# Patient Record
Sex: Female | Born: 1950
Health system: Southern US, Community
[De-identification: ages and names within clinical notes are randomized; demographics above are authoritative.]

## PROBLEM LIST (undated history)

## (undated) DIAGNOSIS — K219 Gastro-esophageal reflux disease without esophagitis: Secondary | ICD-10-CM

## (undated) DIAGNOSIS — J449 Chronic obstructive pulmonary disease, unspecified: Secondary | ICD-10-CM

## (undated) DIAGNOSIS — Z9889 Other specified postprocedural states: Secondary | ICD-10-CM

## (undated) DIAGNOSIS — E785 Hyperlipidemia, unspecified: Secondary | ICD-10-CM

## (undated) DIAGNOSIS — Z720 Tobacco use: Secondary | ICD-10-CM

## (undated) DIAGNOSIS — I10 Essential (primary) hypertension: Secondary | ICD-10-CM

## (undated) DIAGNOSIS — I2510 Atherosclerotic heart disease of native coronary artery without angina pectoris: Secondary | ICD-10-CM

## (undated) DIAGNOSIS — E119 Type 2 diabetes mellitus without complications: Secondary | ICD-10-CM

## (undated) DIAGNOSIS — R112 Nausea with vomiting, unspecified: Secondary | ICD-10-CM

## (undated) DIAGNOSIS — I214 Non-ST elevation (NSTEMI) myocardial infarction: Secondary | ICD-10-CM

## (undated) DIAGNOSIS — G4730 Sleep apnea, unspecified: Secondary | ICD-10-CM

## (undated) DIAGNOSIS — Z9861 Coronary angioplasty status: Secondary | ICD-10-CM

## (undated) DIAGNOSIS — M1990 Unspecified osteoarthritis, unspecified site: Secondary | ICD-10-CM

## (undated) HISTORY — DX: Sleep apnea, unspecified: G47.30

## (undated) HISTORY — DX: Unspecified osteoarthritis, unspecified site: M19.90

## (undated) HISTORY — PX: EYE SURGERY: SHX253

## (undated) HISTORY — DX: Type 2 diabetes mellitus without complications: E11.9

## (undated) HISTORY — PX: TONSILLECTOMY: SUR1361

## (undated) HISTORY — PX: ABDOMINAL HYSTERECTOMY: SHX81

## (undated) HISTORY — DX: Gastro-esophageal reflux disease without esophagitis: K21.9

---

## 2013-09-10 ENCOUNTER — Inpatient Hospital Stay (HOSPITAL_COMMUNITY)
Admission: EM | Admit: 2013-09-10 | Discharge: 2013-09-13 | DRG: 419 | Disposition: A | Discharge status: Home / Self Care | Payer: Self-pay | Attending: General Surgery | Admitting: General Surgery

## 2013-09-10 ENCOUNTER — Emergency Department (HOSPITAL_COMMUNITY): Payer: Self-pay

## 2013-09-10 ENCOUNTER — Encounter (HOSPITAL_COMMUNITY): Payer: Self-pay | Admitting: Emergency Medicine

## 2013-09-10 DIAGNOSIS — K8050 Calculus of bile duct without cholangitis or cholecystitis without obstruction: Secondary | ICD-10-CM

## 2013-09-10 DIAGNOSIS — F17200 Nicotine dependence, unspecified, uncomplicated: Secondary | ICD-10-CM | POA: Diagnosis present

## 2013-09-10 DIAGNOSIS — K8000 Calculus of gallbladder with acute cholecystitis without obstruction: Secondary | ICD-10-CM

## 2013-09-10 DIAGNOSIS — I10 Essential (primary) hypertension: Secondary | ICD-10-CM | POA: Diagnosis present

## 2013-09-10 DIAGNOSIS — J4489 Other specified chronic obstructive pulmonary disease: Secondary | ICD-10-CM | POA: Diagnosis present

## 2013-09-10 DIAGNOSIS — J449 Chronic obstructive pulmonary disease, unspecified: Secondary | ICD-10-CM | POA: Diagnosis present

## 2013-09-10 HISTORY — DX: Nausea with vomiting, unspecified: R11.2

## 2013-09-10 HISTORY — DX: Calculus of gallbladder with acute cholecystitis without obstruction: K80.00

## 2013-09-10 HISTORY — DX: Nausea with vomiting, unspecified: Z98.890

## 2013-09-10 LAB — URINALYSIS, ROUTINE W REFLEX MICROSCOPIC
Bilirubin Urine: NEGATIVE
Glucose, UA: NEGATIVE mg/dL
Ketones, ur: NEGATIVE mg/dL
Leukocytes, UA: NEGATIVE
Protein, ur: NEGATIVE mg/dL
Urobilinogen, UA: 0.2 mg/dL (ref 0.0–1.0)
pH: 6 (ref 5.0–8.0)

## 2013-09-10 LAB — CBC WITH DIFFERENTIAL/PLATELET
Basophils Absolute: 0 10*3/uL (ref 0.0–0.1)
Eosinophils Absolute: 0.1 10*3/uL (ref 0.0–0.7)
Eosinophils Relative: 1 % (ref 0–5)
Hemoglobin: 13.3 g/dL (ref 12.0–15.0)
Lymphocytes Relative: 20 % (ref 12–46)
Lymphs Abs: 2.2 10*3/uL (ref 0.7–4.0)
MCH: 29.5 pg (ref 26.0–34.0)
Neutro Abs: 8.2 10*3/uL — ABNORMAL HIGH (ref 1.7–7.7)
Neutrophils Relative %: 74 % (ref 43–77)
Platelets: 343 10*3/uL (ref 150–400)
RBC: 4.51 MIL/uL (ref 3.87–5.11)
RDW: 14.3 % (ref 11.5–15.5)
WBC: 11.1 10*3/uL — ABNORMAL HIGH (ref 4.0–10.5)

## 2013-09-10 LAB — COMPREHENSIVE METABOLIC PANEL
Albumin: 3.4 g/dL — ABNORMAL LOW (ref 3.5–5.2)
BUN: 19 mg/dL (ref 6–23)
CO2: 26 mEq/L (ref 19–32)
Calcium: 9.4 mg/dL (ref 8.4–10.5)
Chloride: 97 mEq/L (ref 96–112)
Creatinine, Ser: 0.89 mg/dL (ref 0.50–1.10)
GFR calc Af Amer: 78 mL/min — ABNORMAL LOW (ref >90)
GFR calc non Af Amer: 67 mL/min — ABNORMAL LOW (ref >90)
Total Bilirubin: 0.2 mg/dL — ABNORMAL LOW (ref 0.3–1.2)
Total Protein: 7 g/dL (ref 6.0–8.3)

## 2013-09-10 LAB — LIPASE, BLOOD: Lipase: 23 U/L (ref 11–59)

## 2013-09-10 MED ORDER — DIPHENHYDRAMINE HCL 50 MG/ML IJ SOLN: 12.5000 mg | Freq: Four times a day (QID) | INTRAMUSCULAR | Status: DC | PRN

## 2013-09-10 MED ORDER — OXYCODONE HCL 5 MG PO TABS: 5.0000 mg | ORAL_TABLET | ORAL | Status: DC | PRN

## 2013-09-10 MED ORDER — HYDROMORPHONE HCL PF 1 MG/ML IJ SOLN
1.0000 mg | Freq: Once | INTRAMUSCULAR | Status: AC | PRN
  Administered 2013-09-10: 1 mg via INTRAVENOUS
  Filled 2013-09-10: qty 1

## 2013-09-10 MED ORDER — METOCLOPRAMIDE HCL 5 MG/ML IJ SOLN
10.0000 mg | Freq: Once | INTRAMUSCULAR | Status: AC
  Administered 2013-09-10: 10 mg via INTRAVENOUS
  Filled 2013-09-10: qty 2

## 2013-09-10 MED ORDER — KETOROLAC TROMETHAMINE 30 MG/ML IJ SOLN
30.0000 mg | Freq: Once | INTRAMUSCULAR | Status: AC
  Administered 2013-09-10: 30 mg via INTRAVENOUS
  Filled 2013-09-10: qty 1

## 2013-09-10 MED ORDER — ONDANSETRON HCL 4 MG/2ML IJ SOLN
4.0000 mg | Freq: Once | INTRAMUSCULAR | Status: AC
  Administered 2013-09-10: 4 mg via INTRAVENOUS
  Filled 2013-09-10: qty 2

## 2013-09-10 MED ORDER — SODIUM CHLORIDE 0.9 % IV SOLN
3.0000 g | Freq: Four times a day (QID) | INTRAVENOUS | Status: DC
  Administered 2013-09-10 – 2013-09-12 (×8): 3 g via INTRAVENOUS
  Filled 2013-09-10 (×13): qty 3

## 2013-09-10 MED ORDER — HYDROCODONE-ACETAMINOPHEN 5-325 MG PO TABS: 1.0000 | ORAL_TABLET | ORAL | Status: DC | PRN

## 2013-09-10 MED ORDER — KCL IN DEXTROSE-NACL 20-5-0.45 MEQ/L-%-% IV SOLN
INTRAVENOUS | Status: DC
  Administered 2013-09-10 – 2013-09-12 (×3): via INTRAVENOUS

## 2013-09-10 MED ORDER — ENOXAPARIN SODIUM 40 MG/0.4ML ~~LOC~~ SOLN
40.0000 mg | SUBCUTANEOUS | Status: DC
  Administered 2013-09-10 – 2013-09-11 (×2): 40 mg via SUBCUTANEOUS
  Filled 2013-09-10 (×2): qty 0.4

## 2013-09-10 MED ORDER — SODIUM CHLORIDE 0.9 % IV SOLN
INTRAVENOUS | Status: DC
  Administered 2013-09-10: 05:00:00 via INTRAVENOUS

## 2013-09-10 MED ORDER — HYDROMORPHONE HCL PF 1 MG/ML IJ SOLN
1.0000 mg | INTRAMUSCULAR | Status: DC | PRN
  Administered 2013-09-10 – 2013-09-12 (×11): 1 mg via INTRAVENOUS
  Filled 2013-09-10 (×11): qty 1

## 2013-09-10 MED ORDER — INFLUENZA VAC SPLIT QUAD 0.5 ML IM SUSP
0.5000 mL | INTRAMUSCULAR | Status: AC
  Administered 2013-09-11: 0.5 mL via INTRAMUSCULAR
  Filled 2013-09-10: qty 0.5

## 2013-09-10 MED ORDER — ONDANSETRON 8 MG PO TBDP: 8.0000 mg | ORAL_TABLET | Freq: Three times a day (TID) | ORAL | Status: DC | PRN

## 2013-09-10 MED ORDER — ACETAMINOPHEN 650 MG RE SUPP: 650.0000 mg | Freq: Four times a day (QID) | RECTAL | Status: DC | PRN

## 2013-09-10 MED ORDER — NICOTINE 21 MG/24HR TD PT24
21.0000 mg | MEDICATED_PATCH | Freq: Every day | TRANSDERMAL | Status: DC
  Administered 2013-09-10 – 2013-09-12 (×3): 21 mg via TRANSDERMAL
  Filled 2013-09-10 (×4): qty 1

## 2013-09-10 MED ORDER — ONDANSETRON HCL 4 MG/2ML IJ SOLN
4.0000 mg | Freq: Four times a day (QID) | INTRAMUSCULAR | Status: DC | PRN
  Administered 2013-09-10 – 2013-09-12 (×7): 4 mg via INTRAVENOUS
  Filled 2013-09-10 (×6): qty 2

## 2013-09-10 MED ORDER — PNEUMOCOCCAL VAC POLYVALENT 25 MCG/0.5ML IJ INJ
0.5000 mL | INJECTION | INTRAMUSCULAR | Status: AC
  Administered 2013-09-11: 0.5 mL via INTRAMUSCULAR
  Filled 2013-09-10: qty 0.5

## 2013-09-10 MED ORDER — DIPHENHYDRAMINE HCL 12.5 MG/5ML PO ELIX: 12.5000 mg | ORAL_SOLUTION | Freq: Four times a day (QID) | ORAL | Status: DC | PRN

## 2013-09-10 MED ORDER — ACETAMINOPHEN 325 MG PO TABS
650.0000 mg | ORAL_TABLET | Freq: Four times a day (QID) | ORAL | Status: DC | PRN
  Administered 2013-09-11: 650 mg via ORAL
  Filled 2013-09-10: qty 2

## 2013-09-10 MED ORDER — HYDROMORPHONE HCL PF 1 MG/ML IJ SOLN
1.0000 mg | Freq: Once | INTRAMUSCULAR | Status: AC
  Administered 2013-09-10: 1 mg via INTRAVENOUS
  Filled 2013-09-10: qty 1

## 2013-09-10 NOTE — ED Notes (Signed)
Reports right side flank pain that started around midnight. Pt states pain started & then vomiting. Denies any urinary problems & no history of stones.

## 2013-09-10 NOTE — ED Provider Notes (Signed)
CSN: [id number]     Arrival date & time 09/10/13  0405 History   First MD Initiated Contact with Patient 09/10/13 0430     Chief Complaint  Patient presents with  . Flank Pain   (Consider location/radiation/quality/duration/timing/severity/associated sxs/prior Treatment) HPI This is a 62 year old female with right flank pain that began around midnight. The pain radiates to the right lower rib cage. The pain is constant and does not change with movement or palpation. It has been associated with nausea and vomiting. She has a history of nephrolithiasis. She denied any urinary complaints but had difficulty voiding in the ED. The pain is severe at its worst.   History reviewed. No pertinent past medical history. Past Surgical History  Procedure Laterality Date  . Abdominal hysterectomy    . Tonsillectomy     No family history on file. History  Substance Use Topics  . Smoking status: Current Every Day Smoker    Types: Cigarettes  . Smokeless tobacco: Not on file  . Alcohol Use: No   OB History   Grav Para Term Preterm Abortions TAB SAB Ect Mult Living                 Review of Systems  All other systems reviewed and are negative.    Allergies  Review of patient's allergies indicates no known allergies.  Home Medications   Current Outpatient Rx  Name  Route  Sig  Dispense  Refill  . ibuprofen (ADVIL,MOTRIN) 800 MG tablet   Oral   Take 800 mg by mouth as needed.          BP 156/65  Pulse 77  Temp(Src) 97.8 F (36.6 C) (Oral)  Resp 20  Ht 5\' 4"  (1.626 m)  Wt 200 lb (90.719 kg)  BMI 34.31 kg/m2  SpO2 100%  Physical Exam General: Well-developed, well-nourished female in no acute distress; appearance consistent with age of record; appears uncomfortable HENT: normocephalic; atraumatic Eyes: pupils equal, round and reactive to light; extraocular muscles intact Neck: supple Heart: regular rate and rhythm Lungs: clear to auscultation bilaterally Abdomen: soft;  nondistended; nontender; no masses or hepatosplenomegaly; bowel sounds present Extremities: No deformity; full range of motion; pulses normal;  Neurologic: Awake, alert and oriented; motor function intact in all extremities and symmetric; no facial droop Skin: Warm and dry Psychiatric: Mildly agitated    ED Course  Procedures (including critical care time)  MDM  Nursing notes and vitals signs, including pulse oximetry, reviewed.  Summary of this visit's results, reviewed by myself:  Labs:  Results for orders placed during the hospital encounter of 09/10/13 (from the past 24 hour(s))  URINALYSIS, ROUTINE W REFLEX MICROSCOPIC     Status: Abnormal   Collection Time    09/10/13  4:35 AM      Result Value Range   Color, Urine YELLOW  YELLOW   APPearance CLEAR  CLEAR   Specific Gravity, Urine >1.030 (*) 1.005 - 1.030   pH 6.0  5.0 - 8.0   Glucose, UA NEGATIVE  NEGATIVE mg/dL   Hgb urine dipstick NEGATIVE  NEGATIVE   Bilirubin Urine NEGATIVE  NEGATIVE   Ketones, ur NEGATIVE  NEGATIVE mg/dL   Protein, ur NEGATIVE  NEGATIVE mg/dL   Urobilinogen, UA 0.2  0.0 - 1.0 mg/dL   Nitrite NEGATIVE  NEGATIVE   Leukocytes, UA NEGATIVE  NEGATIVE  COMPREHENSIVE METABOLIC PANEL     Status: Abnormal   Collection Time    09/10/13  5:30 AM  Result Value Range   Sodium 136  135 - 145 mEq/L   Potassium 3.6  3.5 - 5.1 mEq/L   Chloride 97  96 - 112 mEq/L   CO2 26  19 - 32 mEq/L   Glucose, Bld 160 (*) 70 - 99 mg/dL   BUN 19  6 - 23 mg/dL   Creatinine, Ser 1.61  0.50 - 1.10 mg/dL   Calcium 9.4  8.4 - 09.6 mg/dL   Total Protein 7.0  6.0 - 8.3 g/dL   Albumin 3.4 (*) 3.5 - 5.2 g/dL   AST 13  0 - 37 U/L   ALT 12  0 - 35 U/L   Alkaline Phosphatase 73  39 - 117 U/L   Total Bilirubin 0.2 (*) 0.3 - 1.2 mg/dL   GFR calc non Af Amer 67 (*) >90 mL/min   GFR calc Af Amer 78 (*) >90 mL/min  LIPASE, BLOOD     Status: None   Collection Time    09/10/13  5:30 AM      Result Value Range   Lipase 23  11 -  59 U/L  CBC WITH DIFFERENTIAL     Status: Abnormal   Collection Time    09/10/13  5:30 AM      Result Value Range   WBC 11.1 (*) 4.0 - 10.5 K/uL   RBC 4.51  3.87 - 5.11 MIL/uL   Hemoglobin 13.3  12.0 - 15.0 g/dL   HCT 04.5  40.9 - 81.1 %   MCV 89.1  78.0 - 100.0 fL   MCH 29.5  26.0 - 34.0 pg   MCHC 33.1  30.0 - 36.0 g/dL   RDW 91.4  78.2 - 95.6 %   Platelets 343  150 - 400 K/uL   Neutrophils Relative % 74  43 - 77 %   Neutro Abs 8.2 (*) 1.7 - 7.7 K/uL   Lymphocytes Relative 20  12 - 46 %   Lymphs Abs 2.2  0.7 - 4.0 K/uL   Monocytes Relative 6  3 - 12 %   Monocytes Absolute 0.6  0.1 - 1.0 K/uL   Eosinophils Relative 1  0 - 5 %   Eosinophils Absolute 0.1  0.0 - 0.7 K/uL   Basophils Relative 0  0 - 1 %   Basophils Absolute 0.0  0.0 - 0.1 K/uL    Imaging Studies: Ct Abdomen Pelvis Wo Contrast  09/10/2013   CLINICAL DATA:  Right flank pain starting around midnight. Vomiting.  EXAM: CT ABDOMEN AND PELVIS WITHOUT CONTRAST  TECHNIQUE: Multidetector CT imaging of the abdomen and pelvis was performed following the standard protocol without intravenous contrast.  COMPARISON:  None.  FINDINGS: Mild dependent atelectasis in the lung bases.  The kidneys appear symmetrical in size and shape. No pyelocaliectasis or ureterectasis. No renal, ureteral, or bladder stones. No bladder wall thickening.  Cholelithiasis with gas containing stones in the gallbladder. No infiltration in the fat around the gallbladder. No bile duct dilatation. The unenhanced appearance of the liver, spleen, pancreas, adrenal glands, inferior vena cava, abdominal aorta, and retroperitoneal lymph nodes is unremarkable. The stomach, small bowel, and colon are not abnormally distended. No free air or free fluid in the abdomen. Abdominal wall musculature appears intact. Moderately prominent visceral adipose tissues.  Pelvis: The bladder is decompressed. Uterus appears to be surgically absent. No abnormal adnexal masses. Scattered  diverticula in the sigmoid colon without evidence of diverticulitis. The appendix is normal. No free or loculated pelvic fluid collections.  Degenerative changes in the lumbar spine. No destructive bone lesions.  IMPRESSION: No renal or ureteral stone or obstruction. Cholelithiasis without additional inflammatory changes around the gallbladder.   Electronically Signed   By: Burman Nieves M.D.   On: 09/10/2013 05:38    EKG Interpretation    Date/Time:  Saturday September 10 2013 04:37:26 EST Ventricular Rate:  75 PR Interval:  170 QRS Duration: 106 QT Interval:  412 QTC Calculation: 460 R Axis:   7 Text Interpretation:  Normal sinus rhythm with sinus arrhythmia Possible Left atrial enlargement Incomplete right bundle branch block Cannot rule out Anterior infarct , age undetermined Abnormal ECG No previous ECGs available Confirmed by Ibtisam Benge  MD, Alonzo Loving ([id number]) on 09/10/2013 4:45:00 AM     5:58 AM Pain, nausea controlled at this time. Will obtain US to further evaluate the gallbladder.  6:50 AM Awaiting bilary [place].     Hanley Seamen, MD 09/10/13 [phone]

## 2013-09-10 NOTE — ED Provider Notes (Signed)
Care assumed from Dr. Read Drivers at shift change. Patient had CT scan performed which revealed gallstones. She has a mild white count and was awaiting an ultrasound of her right upper quadrant. This was to be done at 9:00. Dr. Lovell Sheehan from surgery pass through the emergency department on his way to see a patient and inquired about this patient. He reviewed the chart and did not feel as though ultrasound was indicated. She was not feeling much better and he decided he would admit her to the hospital and discuss having her gallbladder removed on Monday. She will be admitted to his service.  Geoffery Lyons, MD 09/10/13 1005

## 2013-09-10 NOTE — ED Notes (Signed)
MD at bedside. 

## 2013-09-10 NOTE — Progress Notes (Signed)
Pt came up to floor from ED. Pt in NAD will continue to monitor.

## 2013-09-10 NOTE — H&P (Signed)
Monica Thompson is an 62 y.o. female.   Chief Complaint: Right-sided abdominal pain HPI: Patient is a 62 year old white female who yesterday evening began experiencing right-sided abdominal pain which radiated to the right flank. She also had nausea. She presented emergency room for further evaluation treatment. She denied any fever, chills, or jaundice. She has never had this pain before. CT scan of the abdomen revealed cholelithiasis. No nephrolithiasis. There was noted within the gallstones, but the significance of this is unknown as the was no air in the wall of the gallbladder. Due to ongoing pain and nausea, the patient be admitted to the hospital for further evaluation treatment.  History reviewed. No pertinent past medical history.  Past Surgical History  Procedure Laterality Date  . Abdominal hysterectomy    . Tonsillectomy      No family history on file. Social History:  reports that she has been smoking Cigarettes.  She has been smoking about 0.00 packs per day. She does not have any smokeless tobacco history on file. She reports that she does not drink alcohol or use illicit drugs.  Allergies: No Known Allergies   (Not in a hospital admission)  Results for orders placed during the hospital encounter of 09/10/13 (from the past 48 hour(s))  URINALYSIS, ROUTINE W REFLEX MICROSCOPIC     Status: Abnormal   Collection Time    09/10/13  4:35 AM      Result Value Range   Color, Urine YELLOW  YELLOW   APPearance CLEAR  CLEAR   Specific Gravity, Urine >1.030 (*) 1.005 - 1.030   pH 6.0  5.0 - 8.0   Glucose, UA NEGATIVE  NEGATIVE mg/dL   Hgb urine dipstick NEGATIVE  NEGATIVE   Bilirubin Urine NEGATIVE  NEGATIVE   Ketones, ur NEGATIVE  NEGATIVE mg/dL   Protein, ur NEGATIVE  NEGATIVE mg/dL   Urobilinogen, UA 0.2  0.0 - 1.0 mg/dL   Nitrite NEGATIVE  NEGATIVE   Leukocytes, UA NEGATIVE  NEGATIVE   Comment: MICROSCOPIC NOT DONE ON URINES WITH NEGATIVE PROTEIN, BLOOD, LEUKOCYTES,  NITRITE, OR GLUCOSE <1000 mg/dL.  COMPREHENSIVE METABOLIC PANEL     Status: Abnormal   Collection Time    09/10/13  5:30 AM      Result Value Range   Sodium 136  135 - 145 mEq/L   Potassium 3.6  3.5 - 5.1 mEq/L   Chloride 97  96 - 112 mEq/L   CO2 26  19 - 32 mEq/L   Glucose, Bld 160 (*) 70 - 99 mg/dL   BUN 19  6 - 23 mg/dL   Creatinine, Ser 8.29  0.50 - 1.10 mg/dL   Calcium 9.4  8.4 - 56.2 mg/dL   Total Protein 7.0  6.0 - 8.3 g/dL   Albumin 3.4 (*) 3.5 - 5.2 g/dL   AST 13  0 - 37 U/L   ALT 12  0 - 35 U/L   Alkaline Phosphatase 73  39 - 117 U/L   Total Bilirubin 0.2 (*) 0.3 - 1.2 mg/dL   GFR calc non Af Amer 67 (*) >90 mL/min   GFR calc Af Amer 78 (*) >90 mL/min   Comment: (NOTE)     The eGFR has been calculated using the CKD EPI equation.     This calculation has not been validated in all clinical situations.     eGFR's persistently <90 mL/min signify possible Chronic Kidney     Disease.  LIPASE, BLOOD     Status: None  Collection Time    09/10/13  5:30 AM      Result Value Range   Lipase 23  11 - 59 U/L  CBC WITH DIFFERENTIAL     Status: Abnormal   Collection Time    09/10/13  5:30 AM      Result Value Range   WBC 11.1 (*) 4.0 - 10.5 K/uL   RBC 4.51  3.87 - 5.11 MIL/uL   Hemoglobin 13.3  12.0 - 15.0 g/dL   HCT 13.2  44.0 - 10.2 %   MCV 89.1  78.0 - 100.0 fL   MCH 29.5  26.0 - 34.0 pg   MCHC 33.1  30.0 - 36.0 g/dL   RDW 72.5  36.6 - 44.0 %   Platelets 343  150 - 400 K/uL   Neutrophils Relative % 74  43 - 77 %   Neutro Abs 8.2 (*) 1.7 - 7.7 K/uL   Lymphocytes Relative 20  12 - 46 %   Lymphs Abs 2.2  0.7 - 4.0 K/uL   Monocytes Relative 6  3 - 12 %   Monocytes Absolute 0.6  0.1 - 1.0 K/uL   Eosinophils Relative 1  0 - 5 %   Eosinophils Absolute 0.1  0.0 - 0.7 K/uL   Basophils Relative 0  0 - 1 %   Basophils Absolute 0.0  0.0 - 0.1 K/uL   Ct Abdomen Pelvis Wo Contrast  09/10/2013   CLINICAL DATA:  Right flank pain starting around midnight. Vomiting.  EXAM: CT  ABDOMEN AND PELVIS WITHOUT CONTRAST  TECHNIQUE: Multidetector CT imaging of the abdomen and pelvis was performed following the standard protocol without intravenous contrast.  COMPARISON:  None.  FINDINGS: Mild dependent atelectasis in the lung bases.  The kidneys appear symmetrical in size and shape. No pyelocaliectasis or ureterectasis. No renal, ureteral, or bladder stones. No bladder wall thickening.  Cholelithiasis with gas containing stones in the gallbladder. No infiltration in the fat around the gallbladder. No bile duct dilatation. The unenhanced appearance of the liver, spleen, pancreas, adrenal glands, inferior vena cava, abdominal aorta, and retroperitoneal lymph nodes is unremarkable. The stomach, small bowel, and colon are not abnormally distended. No free air or free fluid in the abdomen. Abdominal wall musculature appears intact. Moderately prominent visceral adipose tissues.  Pelvis: The bladder is decompressed. Uterus appears to be surgically absent. No abnormal adnexal masses. Scattered diverticula in the sigmoid colon without evidence of diverticulitis. The appendix is normal. No free or loculated pelvic fluid collections. Degenerative changes in the lumbar spine. No destructive bone lesions.  IMPRESSION: No renal or ureteral stone or obstruction. Cholelithiasis without additional inflammatory changes around the gallbladder.   Electronically Signed   By: Burman Nieves M.D.   On: 09/10/2013 05:38    Review of Systems  Constitutional: Positive for malaise/fatigue.  HENT: Negative.   Eyes: Negative.   Respiratory: Negative.   Cardiovascular: Negative.   Gastrointestinal: Positive for nausea and abdominal pain.  Genitourinary: Negative.   Musculoskeletal: Negative.   Skin: Negative.   All other systems reviewed and are negative.    Blood pressure 121/73, pulse 75, temperature 97.8 F (36.6 C), temperature source Oral, resp. rate 18, height 5\' 4"  (1.626 m), weight 90.719 kg (200  lb), SpO2 96.00%. Physical Exam  Vitals reviewed. Constitutional: She is oriented to person, place, and time. She appears well-developed and well-nourished.  Obese WF  HENT:  Head: Normocephalic and atraumatic.  Eyes: No scleral icterus.  Neck: Normal range of motion.  Neck supple.  Cardiovascular: Normal rate, regular rhythm and normal heart sounds.   Respiratory: Effort normal and breath sounds normal.  GI: Soft. She exhibits no distension. There is tenderness. There is no rebound.  Tender in right upper quadrant to deep palpation.  No rigidity noted.  Neurological: She is alert and oriented to person, place, and time.  Skin: Skin is warm and dry.     Assessment/Plan Impression: Cholecystitis, cholelithiasis Plan: The patient be admitted to the hospital for IV antibiotics, pain control, nausea control. Will continue to monitor white blood cell count and liver test. She subsequently will undergo a laparoscopic cholecystectomy. The risks and benefits of the procedure including bleeding, infection, hepatobiliary injury, and the possibility of an open procedure were fully explained to the patient, who gave informed consent.  Cleofas Hudgins A 09/10/2013, 8:18 AM

## 2013-09-11 ENCOUNTER — Inpatient Hospital Stay (HOSPITAL_COMMUNITY): Payer: Self-pay

## 2013-09-11 LAB — CBC
HCT: 43.9 % (ref 36.0–46.0)
Hemoglobin: 13.9 g/dL (ref 12.0–15.0)
MCH: 29 pg (ref 26.0–34.0)
MCHC: 31.7 g/dL (ref 30.0–36.0)
MCV: 91.6 fL (ref 78.0–100.0)
RDW: 14.4 % (ref 11.5–15.5)

## 2013-09-11 LAB — BASIC METABOLIC PANEL
BUN: 13 mg/dL (ref 6–23)
CO2: 25 mEq/L (ref 19–32)
Calcium: 8.6 mg/dL (ref 8.4–10.5)
Creatinine, Ser: 0.74 mg/dL (ref 0.50–1.10)
GFR calc non Af Amer: 88 mL/min — ABNORMAL LOW (ref >90)
Glucose, Bld: 115 mg/dL — ABNORMAL HIGH (ref 70–99)
Sodium: 133 mEq/L — ABNORMAL LOW (ref 135–145)

## 2013-09-11 LAB — HEPATIC FUNCTION PANEL
ALT: 31 U/L (ref 0–35)
Alkaline Phosphatase: 78 U/L (ref 39–117)
Bilirubin, Direct: <0.1 mg/dL (ref 0.0–0.3)
Total Bilirubin: 0.3 mg/dL (ref 0.3–1.2)
Total Protein: 6.8 g/dL (ref 6.0–8.3)

## 2013-09-11 LAB — MAGNESIUM: Magnesium: 2.2 mg/dL (ref 1.5–2.5)

## 2013-09-11 LAB — PHOSPHORUS: Phosphorus: 3.2 mg/dL (ref 2.3–4.6)

## 2013-09-11 MED ORDER — CHLORHEXIDINE GLUCONATE 4 % EX LIQD
1.0000 "application " | Freq: Once | CUTANEOUS | Status: AC
  Administered 2013-09-12: 1 via TOPICAL
  Filled 2013-09-11: qty 15

## 2013-09-11 NOTE — Progress Notes (Signed)
Subjective: Still with right upper quadrant abdominal pain, though is somewhat improved.  Objective: Vital signs in last 24 hours: Temp:  [97.9 F (36.6 C)-100.4 F (38 C)] 98.6 F (37 C) (11/30 0942) Pulse Rate:  [64-93] 93 (11/30 0500) Resp:  [18] 18 (11/30 0500) BP: (92-185)/(60-80) 185/80 mmHg (11/30 0500) SpO2:  [92 %-95 %] 95 % (11/30 0942) Last BM Date: 09/09/13  Intake/Output from previous day: 11/29 0701 - 11/30 0700 In: 460 [P.O.:360; IV Piggyback:100] Out: 200 [Urine:200] Intake/Output this shift: Total I/O In: 240 [P.O.:240] Out: 200 [Urine:200]  General appearance: alert, cooperative and no distress Resp: clear to auscultation bilaterally Cardio: regular rate and rhythm, S1, S2 normal, no murmur, click, rub or gallop GI: Soft, tender in right upper quadrant to deep palpation. No rigidity noted.  Lab Results:   Recent Labs  09/10/13 0530 09/11/13 0515  WBC 11.1* 9.0  HGB 13.3 13.9  HCT 40.2 43.9  PLT 343 280   BMET  Recent Labs  09/10/13 0530 09/11/13 0515  NA 136 133*  K 3.6 3.9  CL 97 97  CO2 26 25  GLUCOSE 160* 115*  BUN 19 13  CREATININE 0.89 0.74  CALCIUM 9.4 8.6   PT/INR No results found for this basename: LABPROT, INR,  in the last 72 hours  Studies/Results: Ct Abdomen Pelvis Wo Contrast  09/10/2013   CLINICAL DATA:  Right flank pain starting around midnight. Vomiting.  EXAM: CT ABDOMEN AND PELVIS WITHOUT CONTRAST  TECHNIQUE: Multidetector CT imaging of the abdomen and pelvis was performed following the standard protocol without intravenous contrast.  COMPARISON:  None.  FINDINGS: Mild dependent atelectasis in the lung bases.  The kidneys appear symmetrical in size and shape. No pyelocaliectasis or ureterectasis. No renal, ureteral, or bladder stones. No bladder wall thickening.  Cholelithiasis with gas containing stones in the gallbladder. No infiltration in the fat around the gallbladder. No bile duct dilatation. The unenhanced  appearance of the liver, spleen, pancreas, adrenal glands, inferior vena cava, abdominal aorta, and retroperitoneal lymph nodes is unremarkable. The stomach, small bowel, and colon are not abnormally distended. No free air or free fluid in the abdomen. Abdominal wall musculature appears intact. Moderately prominent visceral adipose tissues.  Pelvis: The bladder is decompressed. Uterus appears to be surgically absent. No abnormal adnexal masses. Scattered diverticula in the sigmoid colon without evidence of diverticulitis. The appendix is normal. No free or loculated pelvic fluid collections. Degenerative changes in the lumbar spine. No destructive bone lesions.  IMPRESSION: No renal or ureteral stone or obstruction. Cholelithiasis without additional inflammatory changes around the gallbladder.   Electronically Signed   By: Burman Nieves M.D.   On: 09/10/2013 05:38   Dg Chest 2 View  09/11/2013   CLINICAL DATA:  Cough and congestion  EXAM: CHEST - 2 VIEW  COMPARISON:  None  FINDINGS: Mild cardiomegaly. Lungs are clear. No effusion. Spurring in the mid and lower thoracic spine.  IMPRESSION: Mild cardiomegaly   Electronically Signed   By: Oley Balm M.D.   On: 09/11/2013 08:55    Anti-infectives: Anti-infectives   Start     Dose/Rate Route Frequency Ordered Stop   09/10/13 1200  Ampicillin-Sulbactam (UNASYN) 3 g in sodium chloride 0.9 % 100 mL IVPB     3 g 100 mL/hr over 60 Minutes Intravenous Every 6 hours 09/10/13 1054        Assessment/Plan: Impression: Cholecystitis, cholelithiasis, hypertension Plan: Will proceed with laparoscopic cholecystectomy tomorrow. The risks and benefits of the procedure  including bleeding, infection, hepatobiliary injury, and the possibility of an open procedure were fully explained to the patient, who gave informed consent. Will start clonidine due to hypertension.  LOS: 1 day    Ruie Sendejo A 09/11/2013

## 2013-09-12 ENCOUNTER — Inpatient Hospital Stay (HOSPITAL_COMMUNITY): Payer: Self-pay | Admitting: Anesthesiology

## 2013-09-12 ENCOUNTER — Encounter (HOSPITAL_COMMUNITY): Payer: Self-pay | Admitting: Anesthesiology

## 2013-09-12 ENCOUNTER — Encounter (HOSPITAL_COMMUNITY): Payer: Self-pay | Admitting: *Deleted

## 2013-09-12 ENCOUNTER — Encounter (HOSPITAL_COMMUNITY)
Admission: EM | Disposition: A | Discharge status: Home / Self Care | Payer: Self-pay | Source: Home / Self Care | Attending: General Surgery

## 2013-09-12 HISTORY — PX: CHOLECYSTECTOMY: SHX55

## 2013-09-12 LAB — SURGICAL PCR SCREEN
MRSA, PCR: NEGATIVE
Staphylococcus aureus: NEGATIVE

## 2013-09-12 SURGERY — LAPAROSCOPIC CHOLECYSTECTOMY
Anesthesia: General | Site: Abdomen | Wound class: Contaminated

## 2013-09-12 MED ORDER — ONDANSETRON HCL 4 MG/2ML IJ SOLN: 4.0000 mg | Freq: Once | INTRAMUSCULAR | Status: DC | PRN

## 2013-09-12 MED ORDER — GLYCOPYRROLATE 0.2 MG/ML IJ SOLN
INTRAMUSCULAR | Status: AC
  Filled 2013-09-12: qty 1

## 2013-09-12 MED ORDER — DEXAMETHASONE SODIUM PHOSPHATE 4 MG/ML IJ SOLN
INTRAMUSCULAR | Status: AC
  Filled 2013-09-12: qty 1

## 2013-09-12 MED ORDER — FENTANYL CITRATE 0.05 MG/ML IJ SOLN
25.0000 ug | INTRAMUSCULAR | Status: DC | PRN
  Administered 2013-09-12 (×2): 50 ug via INTRAVENOUS

## 2013-09-12 MED ORDER — GLYCOPYRROLATE 0.2 MG/ML IJ SOLN
INTRAMUSCULAR | Status: AC
  Filled 2013-09-12: qty 2

## 2013-09-12 MED ORDER — ENOXAPARIN SODIUM 40 MG/0.4ML ~~LOC~~ SOLN
40.0000 mg | SUBCUTANEOUS | Status: DC
  Administered 2013-09-12: 40 mg via SUBCUTANEOUS
  Filled 2013-09-12: qty 0.4

## 2013-09-12 MED ORDER — DEXAMETHASONE SODIUM PHOSPHATE 4 MG/ML IJ SOLN
4.0000 mg | Freq: Once | INTRAMUSCULAR | Status: AC
  Administered 2013-09-12: 4 mg via INTRAVENOUS

## 2013-09-12 MED ORDER — SODIUM CHLORIDE 0.9 % IR SOLN
Status: DC | PRN
  Administered 2013-09-12: 500 mL

## 2013-09-12 MED ORDER — LIDOCAINE HCL 1 % IJ SOLN
INTRAMUSCULAR | Status: DC | PRN
  Administered 2013-09-12: 50 mg via INTRADERMAL

## 2013-09-12 MED ORDER — MIDAZOLAM HCL 2 MG/2ML IJ SOLN
1.0000 mg | INTRAMUSCULAR | Status: DC | PRN
  Administered 2013-09-12: 2 mg via INTRAVENOUS

## 2013-09-12 MED ORDER — NALOXONE HCL 0.4 MG/ML IJ SOLN
INTRAMUSCULAR | Status: DC | PRN
  Administered 2013-09-12: 50 ug via INTRAVENOUS

## 2013-09-12 MED ORDER — SODIUM CHLORIDE 0.9 % IN NEBU
INHALATION_SOLUTION | RESPIRATORY_TRACT | Status: AC
  Filled 2013-09-12: qty 3

## 2013-09-12 MED ORDER — PANTOPRAZOLE SODIUM 40 MG PO TBEC
40.0000 mg | DELAYED_RELEASE_TABLET | Freq: Every day | ORAL | Status: DC
  Administered 2013-09-12: 40 mg via ORAL
  Filled 2013-09-12: qty 1

## 2013-09-12 MED ORDER — ALBUTEROL SULFATE (5 MG/ML) 0.5% IN NEBU
2.5000 mg | INHALATION_SOLUTION | Freq: Once | RESPIRATORY_TRACT | Status: AC
  Administered 2013-09-12: 2.5 mg via RESPIRATORY_TRACT

## 2013-09-12 MED ORDER — MIDAZOLAM HCL 2 MG/2ML IJ SOLN
INTRAMUSCULAR | Status: AC
  Filled 2013-09-12: qty 2

## 2013-09-12 MED ORDER — SODIUM CHLORIDE 0.9 % IV SOLN
3.0000 g | Freq: Four times a day (QID) | INTRAVENOUS | Status: DC
  Administered 2013-09-12 – 2013-09-13 (×3): 3 g via INTRAVENOUS
  Filled 2013-09-12 (×9): qty 3

## 2013-09-12 MED ORDER — OXYCODONE-ACETAMINOPHEN 5-325 MG PO TABS
1.0000 | ORAL_TABLET | ORAL | Status: DC | PRN
  Administered 2013-09-12: 2 via ORAL
  Administered 2013-09-12: 1 via ORAL
  Administered 2013-09-13 (×2): 2 via ORAL
  Filled 2013-09-12 (×2): qty 2
  Filled 2013-09-12: qty 1
  Filled 2013-09-12: qty 2
  Filled 2013-09-12: qty 1
  Filled 2013-09-12: qty 2

## 2013-09-12 MED ORDER — BUPIVACAINE HCL (PF) 0.5 % IJ SOLN
INTRAMUSCULAR | Status: DC | PRN
  Administered 2013-09-12: 10 mL

## 2013-09-12 MED ORDER — KETOROLAC TROMETHAMINE 30 MG/ML IJ SOLN
30.0000 mg | Freq: Once | INTRAMUSCULAR | Status: AC
  Administered 2013-09-12: 30 mg via INTRAVENOUS
  Filled 2013-09-12: qty 1

## 2013-09-12 MED ORDER — GLYCOPYRROLATE 0.2 MG/ML IJ SOLN
0.2000 mg | Freq: Once | INTRAMUSCULAR | Status: AC
  Administered 2013-09-12: 0.2 mg via INTRAVENOUS

## 2013-09-12 MED ORDER — FENTANYL CITRATE 0.05 MG/ML IJ SOLN
INTRAMUSCULAR | Status: AC
  Filled 2013-09-12: qty 5

## 2013-09-12 MED ORDER — GLYCOPYRROLATE 0.2 MG/ML IJ SOLN
INTRAMUSCULAR | Status: DC | PRN
  Administered 2013-09-12 (×3): 0.2 mg via INTRAVENOUS

## 2013-09-12 MED ORDER — POVIDONE-IODINE 10 % EX OINT
TOPICAL_OINTMENT | CUTANEOUS | Status: AC
  Filled 2013-09-12: qty 1

## 2013-09-12 MED ORDER — FENTANYL CITRATE 0.05 MG/ML IJ SOLN
INTRAMUSCULAR | Status: DC | PRN
  Administered 2013-09-12 (×2): 50 ug via INTRAVENOUS
  Administered 2013-09-12: 100 ug via INTRAVENOUS
  Administered 2013-09-12 (×2): 50 ug via INTRAVENOUS

## 2013-09-12 MED ORDER — HEMOSTATIC AGENTS (NO CHARGE) OPTIME
TOPICAL | Status: DC | PRN
  Administered 2013-09-12: 1 via TOPICAL

## 2013-09-12 MED ORDER — MIDAZOLAM HCL 5 MG/5ML IJ SOLN
INTRAMUSCULAR | Status: DC | PRN
  Administered 2013-09-12: 2 mg via INTRAVENOUS

## 2013-09-12 MED ORDER — CLONIDINE HCL 0.1 MG PO TABS
0.1000 mg | ORAL_TABLET | Freq: Two times a day (BID) | ORAL | Status: DC
  Filled 2013-09-12 (×3): qty 1

## 2013-09-12 MED ORDER — NEOSTIGMINE METHYLSULFATE 1 MG/ML IJ SOLN
INTRAMUSCULAR | Status: DC | PRN
  Administered 2013-09-12: 2 mg via INTRAVENOUS
  Administered 2013-09-12 (×2): 1 mg via INTRAVENOUS

## 2013-09-12 MED ORDER — FENTANYL CITRATE 0.05 MG/ML IJ SOLN
INTRAMUSCULAR | Status: AC
  Filled 2013-09-12: qty 2

## 2013-09-12 MED ORDER — NALOXONE HCL 0.4 MG/ML IJ SOLN
INTRAMUSCULAR | Status: AC
  Filled 2013-09-12: qty 1

## 2013-09-12 MED ORDER — SUCCINYLCHOLINE CHLORIDE 20 MG/ML IJ SOLN
INTRAMUSCULAR | Status: DC | PRN
  Administered 2013-09-12: 180 mg via INTRAVENOUS

## 2013-09-12 MED ORDER — ONDANSETRON HCL 4 MG/2ML IJ SOLN: 4.0000 mg | Freq: Four times a day (QID) | INTRAMUSCULAR | Status: DC | PRN

## 2013-09-12 MED ORDER — POVIDONE-IODINE 10 % OINT PACKET
TOPICAL_OINTMENT | CUTANEOUS | Status: DC | PRN
  Administered 2013-09-12: 1 via TOPICAL

## 2013-09-12 MED ORDER — ONDANSETRON HCL 4 MG/2ML IJ SOLN
INTRAMUSCULAR | Status: AC
  Filled 2013-09-12: qty 2

## 2013-09-12 MED ORDER — ALBUTEROL SULFATE (5 MG/ML) 0.5% IN NEBU
INHALATION_SOLUTION | RESPIRATORY_TRACT | Status: AC
  Filled 2013-09-12: qty 0.5

## 2013-09-12 MED ORDER — SODIUM CHLORIDE BACTERIOSTATIC 0.9 % IJ SOLN
INTRAMUSCULAR | Status: AC
  Filled 2013-09-12: qty 10

## 2013-09-12 MED ORDER — LACTATED RINGERS IV SOLN: INTRAVENOUS | Status: DC

## 2013-09-12 MED ORDER — ONDANSETRON HCL 4 MG/2ML IJ SOLN
4.0000 mg | Freq: Once | INTRAMUSCULAR | Status: AC
  Administered 2013-09-12: 4 mg via INTRAVENOUS

## 2013-09-12 MED ORDER — BUPIVACAINE HCL (PF) 0.5 % IJ SOLN
INTRAMUSCULAR | Status: AC
  Filled 2013-09-12: qty 30

## 2013-09-12 MED ORDER — LACTATED RINGERS IV SOLN
INTRAVENOUS | Status: DC
  Administered 2013-09-12 (×2): via INTRAVENOUS
  Administered 2013-09-12: 1000 mL via INTRAVENOUS

## 2013-09-12 MED ORDER — ONDANSETRON HCL 4 MG PO TABS: 4.0000 mg | ORAL_TABLET | Freq: Four times a day (QID) | ORAL | Status: DC | PRN

## 2013-09-12 MED ORDER — PROPOFOL 10 MG/ML IV BOLUS
INTRAVENOUS | Status: AC
  Filled 2013-09-12: qty 20

## 2013-09-12 MED ORDER — PROPOFOL 10 MG/ML IV BOLUS
INTRAVENOUS | Status: DC | PRN
  Administered 2013-09-12: 180 mg via INTRAVENOUS

## 2013-09-12 MED ORDER — HYDROMORPHONE HCL PF 1 MG/ML IJ SOLN: 1.0000 mg | INTRAMUSCULAR | Status: DC | PRN

## 2013-09-12 MED ORDER — ROCURONIUM BROMIDE 100 MG/10ML IV SOLN
INTRAVENOUS | Status: DC | PRN
  Administered 2013-09-12: 10 mg via INTRAVENOUS
  Administered 2013-09-12: 30 mg via INTRAVENOUS

## 2013-09-12 MED ORDER — ALBUTEROL SULFATE (5 MG/ML) 0.5% IN NEBU: 2.5000 mg | INHALATION_SOLUTION | Freq: Four times a day (QID) | RESPIRATORY_TRACT | Status: DC | PRN

## 2013-09-12 SURGICAL SUPPLY — 45 items
APPLIER CLIP LAPSCP 10X32 DD (CLIP) ×2 IMPLANT
BAG HAMPER (MISCELLANEOUS) ×2 IMPLANT
BAG SPEC RTRVL LRG 6X4 10 (ENDOMECHANICALS) ×1
CLOTH BEACON ORANGE TIMEOUT ST (SAFETY) ×2 IMPLANT
COVER LIGHT HANDLE STERIS (MISCELLANEOUS) ×4 IMPLANT
DECANTER SPIKE VIAL GLASS SM (MISCELLANEOUS) ×2 IMPLANT
DURAPREP 26ML APPLICATOR (WOUND CARE) ×2 IMPLANT
ELECT REM PT RETURN 9FT ADLT (ELECTROSURGICAL) ×2
ELECTRODE REM PT RTRN 9FT ADLT (ELECTROSURGICAL) ×1 IMPLANT
FILTER SMOKE EVAC LAPAROSHD (FILTER) ×2 IMPLANT
FORMALIN 10 PREFIL 120ML (MISCELLANEOUS) ×2 IMPLANT
GLOVE BIO SURGEON STRL SZ7.5 (GLOVE) ×2 IMPLANT
GLOVE BIOGEL PI IND STRL 8 (GLOVE) ×1 IMPLANT
GLOVE BIOGEL PI INDICATOR 8 (GLOVE) ×1
GLOVE ECLIPSE 7.0 STRL STRAW (GLOVE) ×1 IMPLANT
GLOVE INDICATOR 7.0 STRL GRN (GLOVE) ×2 IMPLANT
GLOVE SS BIOGEL STRL SZ 6.5 (GLOVE) IMPLANT
GLOVE SUPERSENSE BIOGEL SZ 6.5 (GLOVE) ×1
GOWN STRL REIN XL XLG (GOWN DISPOSABLE) ×6 IMPLANT
HEMOSTAT SNOW SURGICEL 2X4 (HEMOSTASIS) ×2 IMPLANT
INST SET LAPROSCOPIC AP (KITS) ×2 IMPLANT
IV NS IRRIG 3000ML ARTHROMATIC (IV SOLUTION) IMPLANT
KIT ROOM TURNOVER APOR (KITS) ×2 IMPLANT
MANIFOLD NEPTUNE II (INSTRUMENTS) ×2 IMPLANT
NDL INSUFFLATION 14GA 120MM (NEEDLE) ×1 IMPLANT
NEEDLE INSUFFLATION 14GA 120MM (NEEDLE) ×2 IMPLANT
NS IRRIG 1000ML POUR BTL (IV SOLUTION) ×2 IMPLANT
PACK LAP CHOLE LZT030E (CUSTOM PROCEDURE TRAY) ×2 IMPLANT
PAD ARMBOARD 7.5X6 YLW CONV (MISCELLANEOUS) ×2 IMPLANT
PENCIL HANDSWITCHING (ELECTRODE) ×1 IMPLANT
POUCH SPECIMEN RETRIEVAL 10MM (ENDOMECHANICALS) ×2 IMPLANT
SET BASIN LINEN APH (SET/KITS/TRAYS/PACK) ×2 IMPLANT
SET TUBE IRRIG SUCTION NO TIP (IRRIGATION / IRRIGATOR) IMPLANT
SLEEVE ENDOPATH XCEL 5M (ENDOMECHANICALS) ×2 IMPLANT
SPONGE GAUZE 2X2 8PLY STRL LF (GAUZE/BANDAGES/DRESSINGS) ×8 IMPLANT
SPONGE LAP 4X18 X RAY DECT (DISPOSABLE) ×1 IMPLANT
STAPLER VISISTAT (STAPLE) ×2 IMPLANT
SUT VICRYL 0 UR6 27IN ABS (SUTURE) ×2 IMPLANT
TAPE CLOTH SURG 4X10 WHT LF (GAUZE/BANDAGES/DRESSINGS) ×1 IMPLANT
TROCAR ENDO BLADELESS 11MM (ENDOMECHANICALS) ×2 IMPLANT
TROCAR XCEL NON-BLD 5MMX100MML (ENDOMECHANICALS) ×2 IMPLANT
TROCAR XCEL UNIV SLVE 11M 100M (ENDOMECHANICALS) ×2 IMPLANT
TUBING INSUFFLATION (TUBING) ×2 IMPLANT
WARMER LAPAROSCOPE (MISCELLANEOUS) ×2 IMPLANT
YANKAUER SUCT 12FT TUBE ARGYLE (SUCTIONS) ×2 IMPLANT

## 2013-09-12 NOTE — Op Note (Signed)
Patient:  Monica Thompson  DOB:  [birth year]  MRN:  [id number]   Preop Diagnosis:  Cholecystitis, cholelithiasis  Postop Diagnosis:  Same  Procedure:  Laparoscopic cholecystectomy  Surgeon:  Franky Macho, M.D.  Anes:  General endotracheal  Indications:  Patient is a 62 year old white female who presents with cholecystitis secondary to cholelithiasis. The risks and benefits of the procedure including bleeding, infection, hepatobiliary, and the possibility of an open procedure were fully explained to the patient, who gave informed consent.  Procedure note:  The patient is placed the supine position. After induction of general endotracheal anesthesia, the abdomen was prepped and draped using usual sterile technique with DuraPrep. Surgical site confirmation was performed.  A supraumbilical incision was made down to the fascia. A Veress needle was introduced into the abdominal confirmation of placement was done using the saline drop test. The abdomen was then insufflated to 16 mm mercury pressure. An 11 mm trocar was introduced into the abdominal cavity under direct visualization without difficulty. The patient was placed in reverse to number position and additional 11 mm trocar was placed the epigastric region and 5 mm trochars were placed in the right upper quadrant and right flank regions. The liver was inspected and noted within normal limits. The gallbladder was noted to be inflamed with a thickened gallbladder wall. In order to facilitate exposure, the gallbladder was decompressed at its fundus and bile was evacuated using the sucker. The gallbladder was then retracted in a dynamic fashion in order to expose the triangle of Calot. The cystic duct was first identified. Its juncture to the infundibulum was fully identified. Endoclips placed proximally and distally on the cystic duct, and the cystic duct was divided. This was likewise done the cystic artery. The gallbladder was then freed away from  the gallbladder fossa using Bovie electrocautery. The gallbladder was delivered through the epigastric trocar site using an Endo Catch bag. The gallbladder fossa was inspected and no abnormal bleeding or bile leakage was noted. Surgicel is placed the gallbladder fossa. All fluid and air were then evacuated from the abdominal cavity prior to removal of the trochars.  All wounds were irrigated with normal saline. All wounds were injected with 0.5% Sensorcaine. The epigastric fascia was reapproximated using 0 Vicryl interrupted sutures. All skin incisions were closed using staples. Betadine ointment and dry sterile dressings were applied.  All tape and needle counts were correct at the end of the procedure. Patient was extubated in the operating room and transferred to PACU in stable condition. Complications:  None  EBL:  Minimal  Specimen:  Gallbladder

## 2013-09-12 NOTE — Transfer of Care (Signed)
Immediate Anesthesia Transfer of Care Note  Patient: Monica Thompson  Procedure(s) Performed: Procedure(s): LAPAROSCOPIC CHOLECYSTECTOMY (N/A)  Patient Location: PACU  Anesthesia Type:General  Level of Consciousness: awake and patient cooperative  Airway & Oxygen Therapy: Patient Spontanous Breathing and Patient connected to face mask oxygen  Post-op Assessment: Report given to PACU RN, Post -op Vital signs reviewed and stable and Patient moving all extremities  Post vital signs: Reviewed and stable  Complications: No apparent anesthesia complications

## 2013-09-12 NOTE — Anesthesia Preprocedure Evaluation (Signed)
Anesthesia Evaluation  Patient identified by MRN, date of birth, ID band Patient awake    Reviewed: Allergy & Precautions, H&P , NPO status , Patient's Chart, lab work & pertinent test results  Airway Mallampati: III TM Distance: >3 FB Neck ROM: Full    Dental  (+) Partial Lower and Teeth Intact   Pulmonary COPD (RA sat <90%)Current Smoker,  breath sounds clear to auscultation  + decreased breath sounds      Cardiovascular negative cardio ROS  Rhythm:Regular Rate:Normal     Neuro/Psych    GI/Hepatic   Endo/Other  Morbid obesity  Renal/GU      Musculoskeletal   Abdominal   Peds  Hematology   Anesthesia Other Findings   Reproductive/Obstetrics                           Anesthesia Physical Anesthesia Plan  ASA: III  Anesthesia Plan: General   Post-op Pain Management:    Induction: Intravenous, Rapid sequence and Cricoid pressure planned  Airway Management Planned: Oral ETT and Video Laryngoscope Planned  Additional Equipment:   Intra-op Plan:   Post-operative Plan: Extubation in OR  Informed Consent: I have reviewed the patients History and Physical, chart, labs and discussed the procedure including the risks, benefits and alternatives for the proposed anesthesia with the patient or authorized representative who has indicated his/her understanding and acceptance.     Plan Discussed with:   Anesthesia Plan Comments: (preop jet nebs w/ albuterol)        Anesthesia Quick Evaluation

## 2013-09-12 NOTE — Anesthesia Procedure Notes (Addendum)
Procedure Name: Intubation Date/Time: 09/12/2013 9:17 AM Performed by: Despina Hidden Pre-anesthesia Checklist: Emergency Drugs available, Suction available, Patient being monitored and Patient identified Patient Re-evaluated:Patient Re-evaluated prior to inductionOxygen Delivery Method: Circle system utilized Preoxygenation: Pre-oxygenation with 100% oxygen Intubation Type: IV induction, Rapid sequence and Cricoid Pressure applied Ventilation: Mask ventilation without difficulty and Oral airway inserted - appropriate to patient size Grade View: Grade II Tube type: Oral Tube size: 7.0 mm Number of attempts: 1 Airway Equipment and Method: Stylet and Video-laryngoscopy Placement Confirmation: ETT inserted through vocal cords under direct vision,  positive ETCO2 and breath sounds checked- equal and bilateral Secured at: 22 cm Tube secured with: Tape Dental Injury: Teeth and Oropharynx as per pre-operative assessment    Performed by: Joycelyn Man J Difficulty Due To: Difficulty was anticipated

## 2013-09-12 NOTE — Progress Notes (Signed)
09/12/13 1911 Late entry for 1750. Patient ambulated in hallway, from room to nurses station and back. Approximately 50 feet. Tolerated well, some abdominal discomfort with activity. Instructed to use pillow for support to abdomen with coughing, changing positions, ambulation. Encouraged coughing, deep breathing post-op. Patient and family understand rationale. Tolerated diet well, no c/o nausea. Pain controlled this afternoon with percocet as ordered PRN for pain. Earnstine Regal, RN

## 2013-09-12 NOTE — Anesthesia Postprocedure Evaluation (Signed)
  Anesthesia Post-op Note  Patient: Monica Thompson  Procedure(s) Performed: Procedure(s): LAPAROSCOPIC CHOLECYSTECTOMY (N/A)  Patient Location: PACU  Anesthesia Type:General  Level of Consciousness: awake, alert , oriented and patient cooperative  Airway and Oxygen Therapy: Patient Spontanous Breathing and Patient connected to face mask oxygen  Post-op Pain: 3 /10, mild  Post-op Assessment: Post-op Vital signs reviewed, Patient's Cardiovascular Status Stable, Respiratory Function Stable, Patent Airway, No signs of Nausea or vomiting and Pain level controlled  Post-op Vital Signs: Reviewed and stable  Complications: No apparent anesthesia complications

## 2013-09-12 NOTE — Addendum Note (Signed)
Addendum created 09/12/13 1145 by Franco Nones, CRNA   Modules edited: Anesthesia Flowsheet

## 2013-09-13 LAB — HEPATIC FUNCTION PANEL
AST: 34 U/L (ref 0–37)
Bilirubin, Direct: 0.1 mg/dL (ref 0.0–0.3)
Total Bilirubin: 0.3 mg/dL (ref 0.3–1.2)

## 2013-09-13 LAB — BASIC METABOLIC PANEL
Calcium: 8.6 mg/dL (ref 8.4–10.5)
Creatinine, Ser: 0.8 mg/dL (ref 0.50–1.10)
GFR calc Af Amer: 88 mL/min — ABNORMAL LOW (ref >90)
GFR calc non Af Amer: 76 mL/min — ABNORMAL LOW (ref >90)
Sodium: 138 mEq/L (ref 135–145)

## 2013-09-13 LAB — CBC
MCHC: 32.1 g/dL (ref 30.0–36.0)
MCV: 90.3 fL (ref 78.0–100.0)
Platelets: 275 10*3/uL (ref 150–400)
RBC: 4.03 MIL/uL (ref 3.87–5.11)
RDW: 13.9 % (ref 11.5–15.5)
WBC: 9.1 10*3/uL (ref 4.0–10.5)

## 2013-09-13 MED ORDER — OXYCODONE-ACETAMINOPHEN 7.5-325 MG PO TABS: 1.0000 | ORAL_TABLET | ORAL | Status: DC | PRN

## 2013-09-13 NOTE — Discharge Summary (Signed)
Physician Discharge Summary  Patient ID: Monica Thompson MRN: [id number] DOB/AGE: [birth year] 62 y.o.  Admit date: 09/10/2013 Discharge date: 09/13/2013  Admission Diagnoses: Cholecystitis, cholelithiasis, hypertension, COPD  Discharge Diagnoses: Same Active Problems:   Cholecystitis, acute with cholelithiasis   Discharged Condition: good  Hospital Course: Patient is a 62 year old white female presented emergency room with worsening right upper quadrant abdominal pain. She was found to have cholecystitis secondary to cholelithiasis. She was admitted to the hospital for further evaluation treatment. She was started on IV Unasyn. She subsequently on a laparoscopic cholecystectomy on 09/12/2013. She tolerated the procedure well. Postoperative course was the most part unremarkable. She is known to have COPD and she was strongly encouraged to get a primary care physician. She is being discharged home on 09/13/2013 in good improving condition.  Treatments: surgery: Laparoscopic cholecystectomy on 09/12/2013  Discharge Exam: Blood pressure 132/78, pulse 74, temperature 97.4 F (36.3 C), temperature source Oral, resp. rate 18, height 5\' 4"  (1.626 m), weight 134.718 kg (297 lb), SpO2 97.00%. General appearance: alert, cooperative and no distress Resp: clear to auscultation bilaterally Cardio: regular rate and rhythm, S1, S2 normal, no murmur, click, rub or gallop GI: Soft. Dressings dry and intact.  Disposition: Home    Medication List         ALKA-SELTZER ANTACID PO  Take 2 tablets by mouth daily as needed (acid reflux).     HYDROcodone-acetaminophen 5-325 MG per tablet  Commonly known as:  NORCO  Take 1-2 tablets by mouth every 4 (four) hours as needed (for pain).     ibuprofen 800 MG tablet  Commonly known as:  ADVIL,MOTRIN  Take 800 mg by mouth as needed.     ondansetron 8 MG disintegrating tablet  Commonly known as:  ZOFRAN ODT  Take 1 tablet (8 mg total) by mouth every 8  (eight) hours as needed for nausea or vomiting. 8mg  ODT q4 hours prn nausea     oxyCODONE-acetaminophen 7.5-325 MG per tablet  Commonly known as:  PERCOCET  Take 1-2 tablets by mouth every 4 (four) hours as needed.     ranitidine 75 MG tablet  Commonly known as:  ZANTAC  Take 75 mg by mouth daily as needed for heartburn (acid reflux).           Follow-up Information   Follow up with Dalia Heading, MD. Schedule an appointment as soon as possible for a visit on 09/22/2013.   Specialty:  General Surgery   Contact information:   [address] [place] [address] [phone]       Signed: Franky Macho A 09/13/2013, 8:44 AM

## 2013-09-13 NOTE — Progress Notes (Signed)
D/c instructions reviewed with patient and family.  Verbalized understanding.  Pt dc'd to home with daughter-in-law. Schonewitz, Candelaria Stagers 09/13/2013

## 2013-09-13 NOTE — Anesthesia Postprocedure Evaluation (Signed)
  Anesthesia Post-op Note  Patient: Monica Thompson  Procedure(s) Performed: Procedure(s): LAPAROSCOPIC CHOLECYSTECTOMY (N/A)  Patient Location: room 316  Anesthesia Type:General  Level of Consciousness: awake, alert , oriented and patient cooperative  Airway and Oxygen Therapy: Patient Spontanous Breathing  Post-op Pain: 2 /10, mild  Post-op Assessment: Post-op Vital signs reviewed, Patient's Cardiovascular Status Stable, Respiratory Function Stable, Patent Airway, No signs of Nausea or vomiting and Pain level controlled  Post-op Vital Signs: Reviewed and stable  Complications: No apparent anesthesia complications

## 2013-09-14 ENCOUNTER — Encounter (HOSPITAL_COMMUNITY): Payer: Self-pay | Admitting: General Surgery

## 2014-09-16 IMAGING — CR DG CHEST 2V
3 series · 3 of 3 positions shown · non-contrast
Comparison: None

CLINICAL DATA: Cough and congestion

EXAM:
CHEST - 2 VIEW

[view not recorded (1 of 3)]
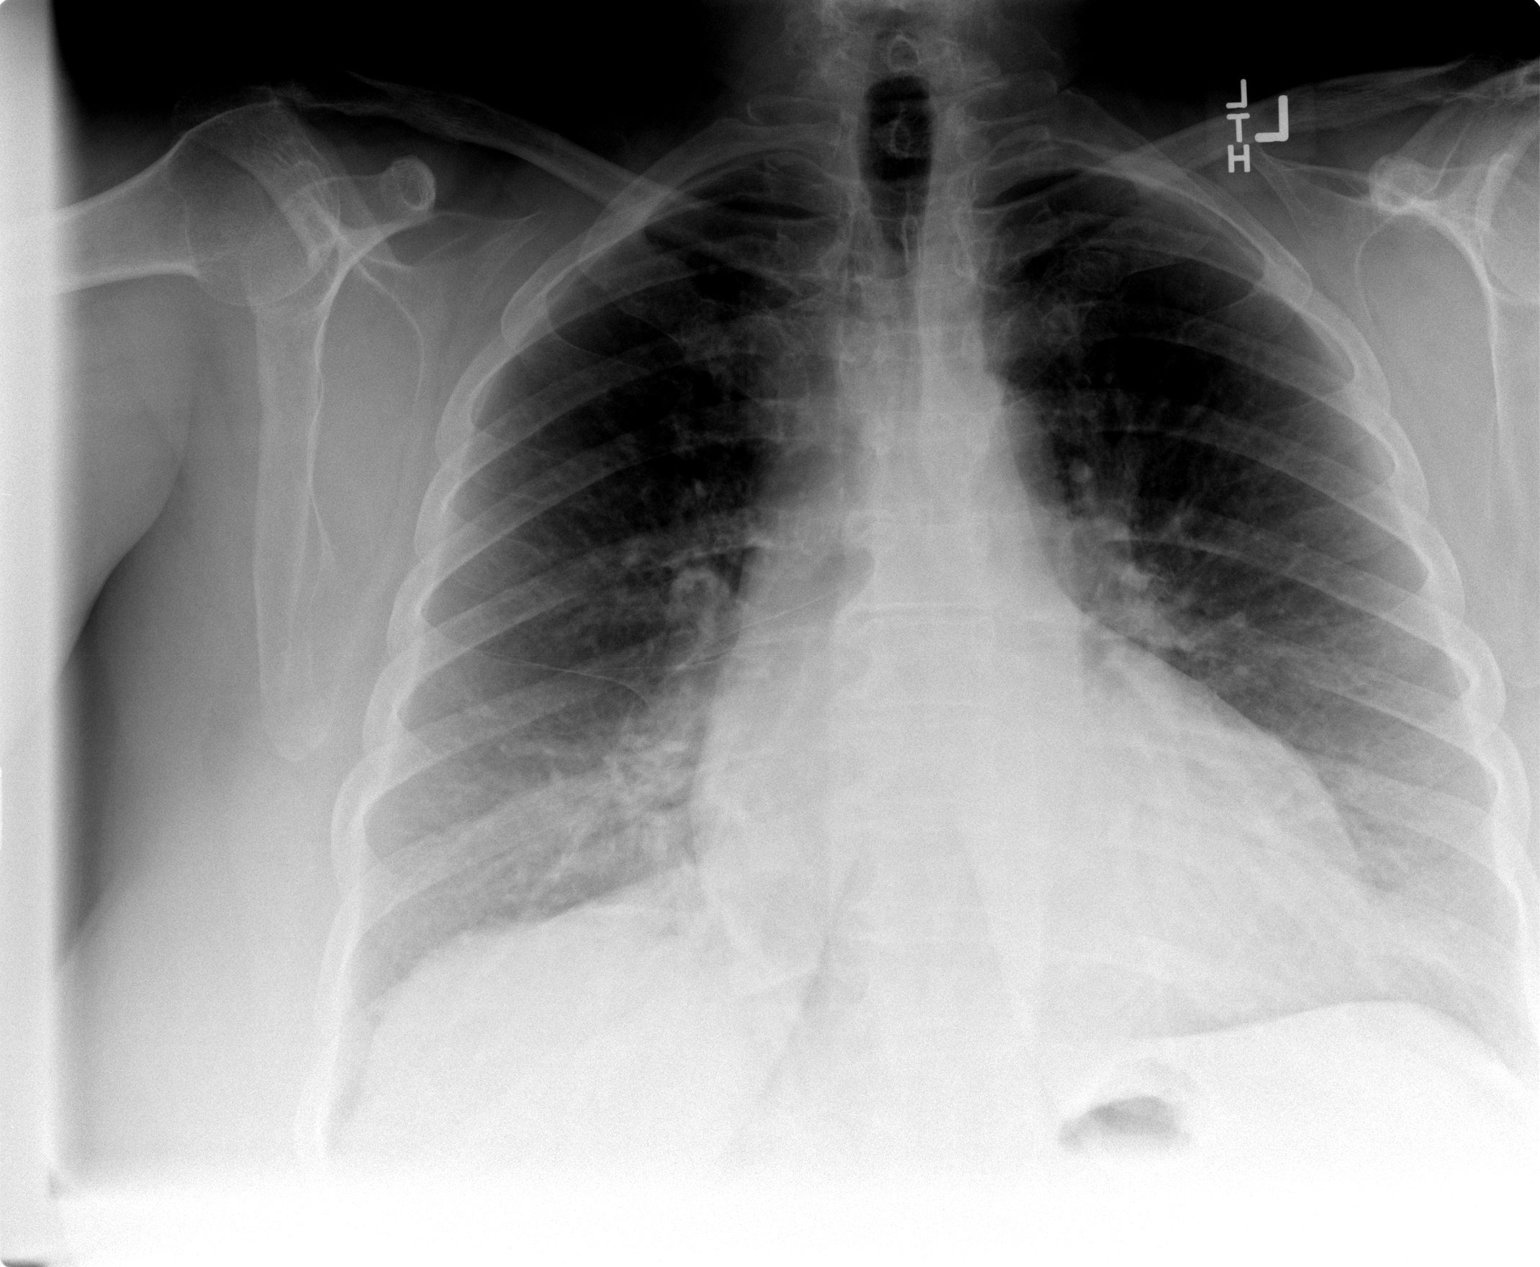

[view not recorded (2 of 3)]
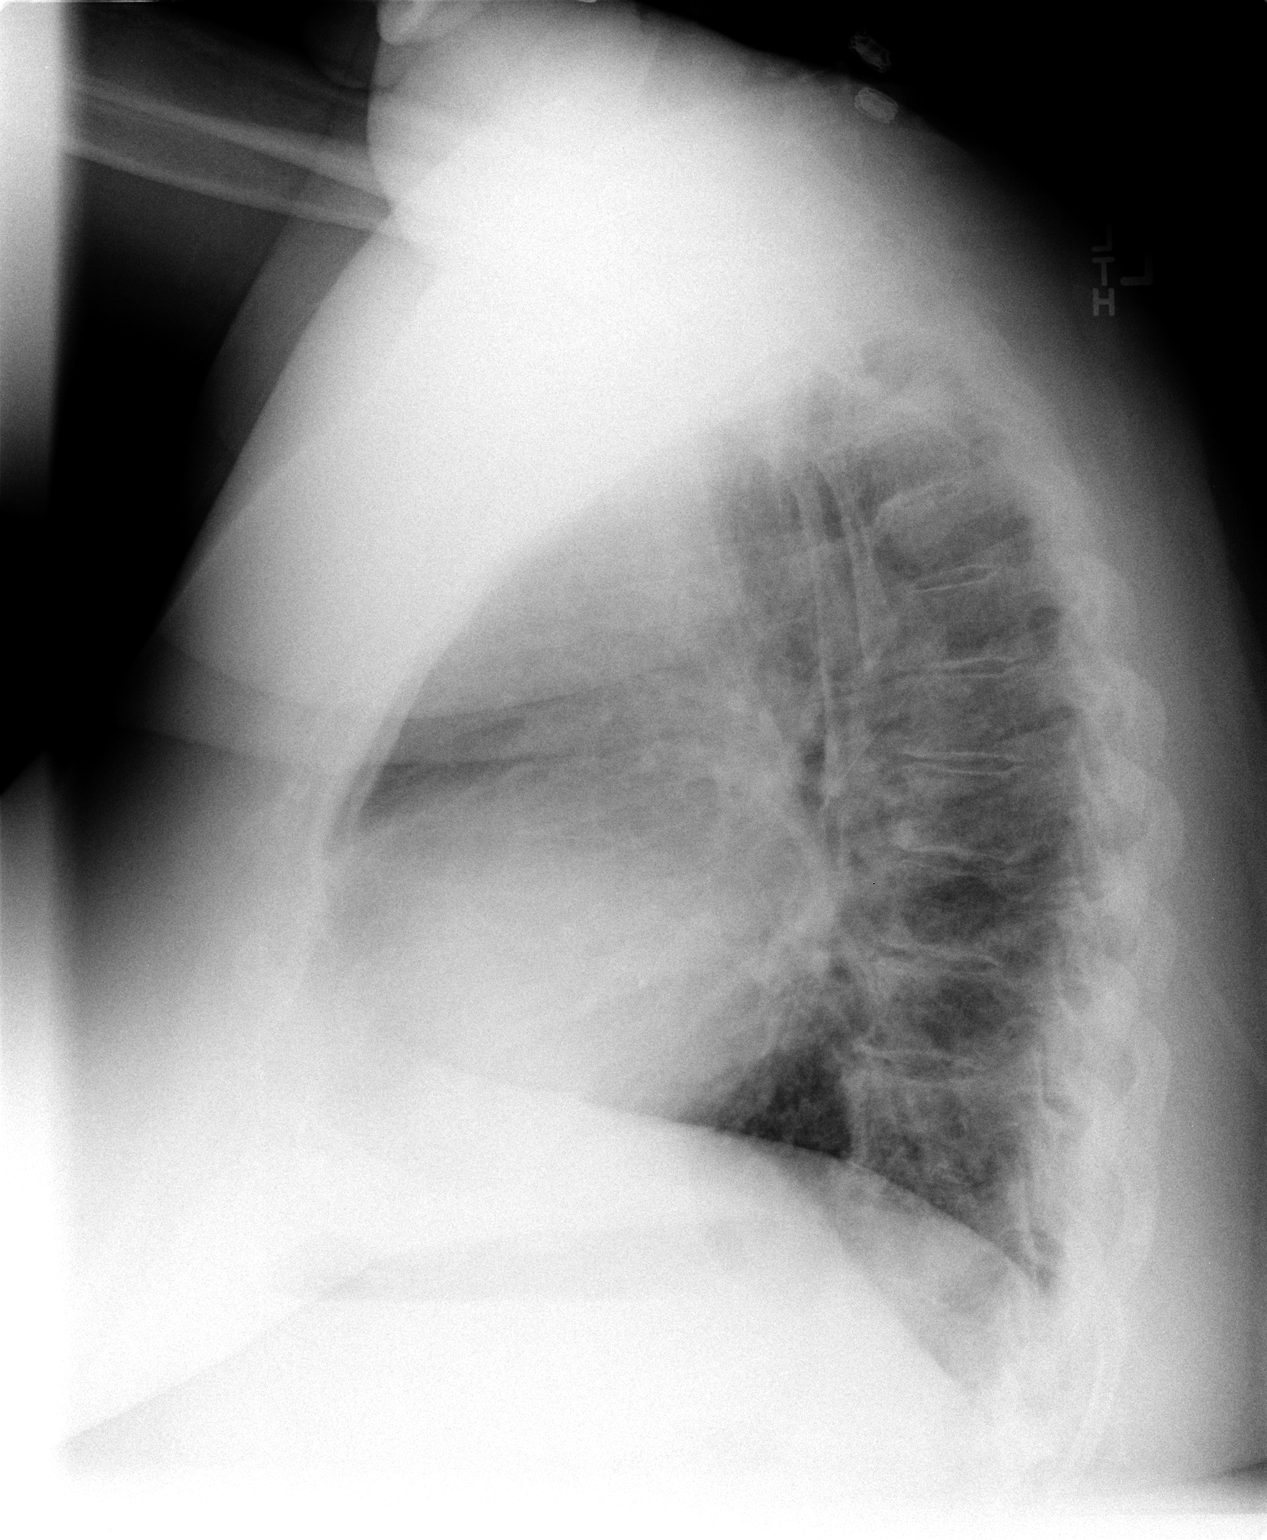

[view not recorded (3 of 3)]
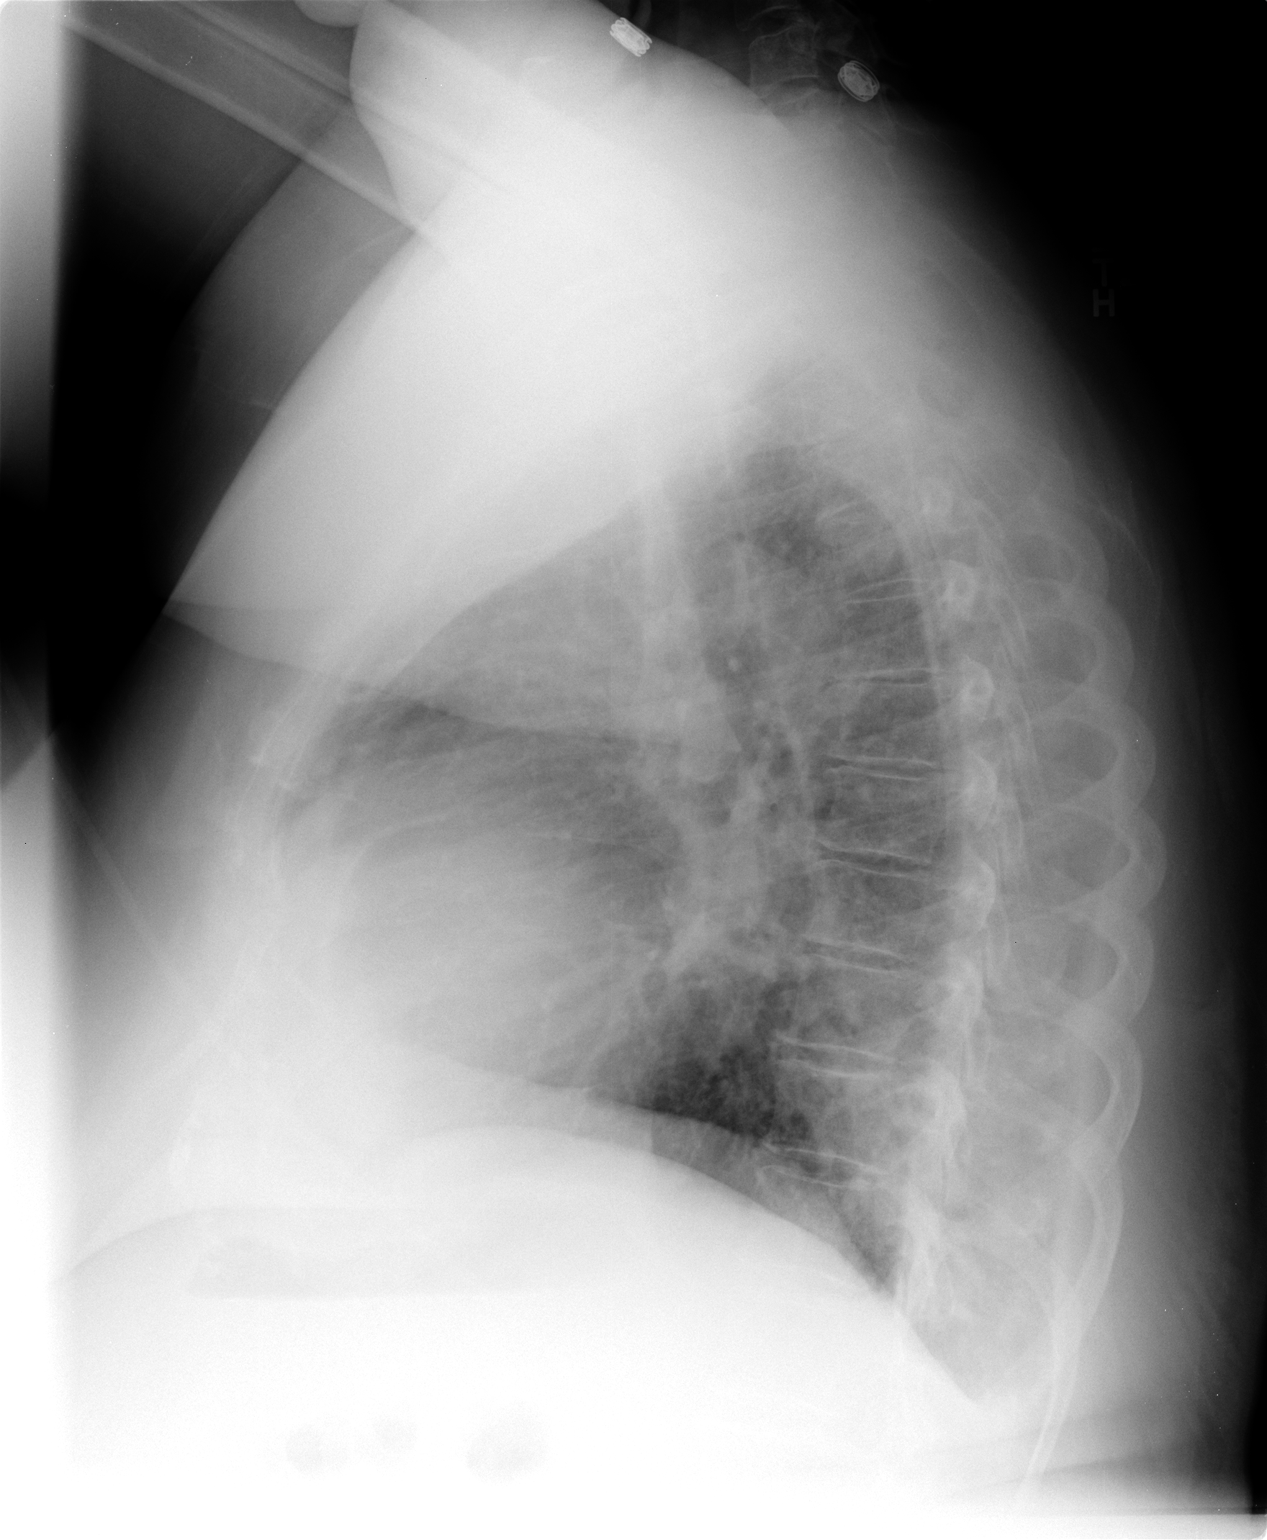

[3 of 3 positions shown; findings below may reference images not displayed]

FINDINGS: Mild cardiomegaly. Lungs are clear. No effusion. Spurring in the mid
and lower thoracic spine.
IMPRESSION: Mild cardiomegaly

## 2014-11-09 ENCOUNTER — Emergency Department (HOSPITAL_COMMUNITY)
Admission: EM | Admit: 2014-11-09 | Discharge: 2014-11-10 | Disposition: A | Discharge status: Home / Self Care | Payer: Medicare HMO | Attending: Emergency Medicine | Admitting: Emergency Medicine

## 2014-11-09 ENCOUNTER — Emergency Department (HOSPITAL_COMMUNITY): Payer: Medicare HMO

## 2014-11-09 ENCOUNTER — Encounter (HOSPITAL_COMMUNITY): Payer: Self-pay | Admitting: Emergency Medicine

## 2014-11-09 DIAGNOSIS — J441 Chronic obstructive pulmonary disease with (acute) exacerbation: Secondary | ICD-10-CM | POA: Insufficient documentation

## 2014-11-09 DIAGNOSIS — R42 Dizziness and giddiness: Secondary | ICD-10-CM | POA: Diagnosis not present

## 2014-11-09 DIAGNOSIS — R05 Cough: Secondary | ICD-10-CM | POA: Diagnosis not present

## 2014-11-09 DIAGNOSIS — Z720 Tobacco use: Secondary | ICD-10-CM | POA: Insufficient documentation

## 2014-11-09 DIAGNOSIS — R12 Heartburn: Secondary | ICD-10-CM | POA: Insufficient documentation

## 2014-11-09 DIAGNOSIS — R200 Anesthesia of skin: Secondary | ICD-10-CM | POA: Diagnosis not present

## 2014-11-09 DIAGNOSIS — R609 Edema, unspecified: Secondary | ICD-10-CM | POA: Diagnosis not present

## 2014-11-09 DIAGNOSIS — M7989 Other specified soft tissue disorders: Secondary | ICD-10-CM | POA: Diagnosis not present

## 2014-11-09 DIAGNOSIS — I10 Essential (primary) hypertension: Secondary | ICD-10-CM | POA: Diagnosis not present

## 2014-11-09 DIAGNOSIS — R079 Chest pain, unspecified: Secondary | ICD-10-CM | POA: Diagnosis present

## 2014-11-09 DIAGNOSIS — Z79899 Other long term (current) drug therapy: Secondary | ICD-10-CM | POA: Insufficient documentation

## 2014-11-09 HISTORY — DX: Chronic obstructive pulmonary disease, unspecified: J44.9

## 2014-11-09 LAB — CBC WITH DIFFERENTIAL/PLATELET
BASOS PCT: 0 % (ref 0–1)
Basophils Absolute: 0 10*3/uL (ref 0.0–0.1)
Eosinophils Absolute: 0.1 10*3/uL (ref 0.0–0.7)
Eosinophils Relative: 1 % (ref 0–5)
HEMATOCRIT: 40.3 % (ref 36.0–46.0)
Hemoglobin: 13.4 g/dL (ref 12.0–15.0)
Lymphocytes Relative: 30 % (ref 12–46)
Lymphs Abs: 2.9 10*3/uL (ref 0.7–4.0)
MCH: 29.9 pg (ref 26.0–34.0)
MCHC: 33.3 g/dL (ref 30.0–36.0)
MCV: 90 fL (ref 78.0–100.0)
MONOS PCT: 4 % (ref 3–12)
Monocytes Absolute: 0.4 10*3/uL (ref 0.1–1.0)
NEUTROS PCT: 65 % (ref 43–77)
Neutro Abs: 6.2 10*3/uL (ref 1.7–7.7)
Platelets: 304 10*3/uL (ref 150–400)
RBC: 4.48 MIL/uL (ref 3.87–5.11)
RDW: 13.2 % (ref 11.5–15.5)
WBC: 9.7 10*3/uL (ref 4.0–10.5)

## 2014-11-09 LAB — COMPREHENSIVE METABOLIC PANEL
ALK PHOS: 67 U/L (ref 39–117)
ALT: 18 U/L (ref 0–35)
ANION GAP: 6 (ref 5–15)
AST: 17 U/L (ref 0–37)
Albumin: 3.6 g/dL (ref 3.5–5.2)
BILIRUBIN TOTAL: 0.4 mg/dL (ref 0.3–1.2)
BUN: 18 mg/dL (ref 6–23)
CALCIUM: 8.8 mg/dL (ref 8.4–10.5)
CHLORIDE: 106 mmol/L (ref 96–112)
CO2: 27 mmol/L (ref 19–32)
CREATININE: 0.77 mg/dL (ref 0.50–1.10)
GFR calc non Af Amer: 86 mL/min — ABNORMAL LOW (ref >90)
GLUCOSE: 102 mg/dL — AB (ref 70–99)
POTASSIUM: 3.9 mmol/L (ref 3.5–5.1)
Sodium: 139 mmol/L (ref 135–145)
Total Protein: 7.1 g/dL (ref 6.0–8.3)

## 2014-11-09 LAB — TROPONIN I
TROPONIN I: 0.06 ng/mL — AB (ref <0.031)
Troponin I: 0.06 ng/mL — ABNORMAL HIGH (ref <0.031)

## 2014-11-09 MED ORDER — GI COCKTAIL ~~LOC~~
30.0000 mL | Freq: Once | ORAL | Status: AC
  Administered 2014-11-09: 30 mL via ORAL
  Filled 2014-11-09: qty 30

## 2014-11-09 MED ORDER — PANTOPRAZOLE SODIUM 20 MG PO TBEC: 20.0000 mg | DELAYED_RELEASE_TABLET | Freq: Every day | ORAL | Status: DC

## 2014-11-09 MED ORDER — PANTOPRAZOLE SODIUM 40 MG IV SOLR
40.0000 mg | Freq: Once | INTRAVENOUS | Status: AC
  Administered 2014-11-09: 40 mg via INTRAVENOUS
  Filled 2014-11-09: qty 40

## 2014-11-09 NOTE — ED Provider Notes (Signed)
CSN: [id number]     Arrival date & time 11/09/14  1851 History  This chart was scribed for Flint Melter, MD by Tonye Royalty, ED Scribe. This patient was seen in room [facility]08/[facility]08 and the patient's care was started at 7:37 PM.    Chief Complaint  Patient presents with  . Chest Pain   The history is provided by the patient. No language interpreter was used.    HPI Comments: Monica Thompson is a 64 y.o. female who presents to the Emergency Department complaining of intermittent central chest pain radiating to her left arm and back with onset 1 week ago. She has difficulty describing the pain but states chest pain is similar to pain in her arm. She notes her arm feels numb. She states pain sometimes comes on more when laying down but nothing particularly triggers it. Relative states it can last upwards of 1 hour. She stats the pain has woken her at night. She states she has never had similar symptoms. She states she has been using antacids which sometimes improves her symptoms. She reports associated decreased food and fluid intake, coughing, feet swelling and SOB described as "the pain making it so she can't breathe." She states she had dizziness only once on standing. She denies history of cardiac disease or HTN temporarily while hospitalized for cholecystectomy. She reports eye surgery and denies other surgeries.    PCP: Dr. Clelia Croft in [place], but states she has not seen him in years   Past Medical History  Diagnosis Date  . PONV (postoperative nausea and vomiting)   . COPD (chronic obstructive pulmonary disease)   . Hypertension    Past Surgical History  Procedure Laterality Date  . Abdominal hysterectomy    . Tonsillectomy    . Cholecystectomy N/A 09/12/2013    Procedure: LAPAROSCOPIC CHOLECYSTECTOMY;  Surgeon: Dalia Heading, MD;  Location: [facility];  Service: General;  Laterality: N/A;   No family history on file. History  Substance Use Topics  . Smoking status: Current Every Day Smoker  -- 0.50 packs/day    Types: Cigarettes  . Smokeless tobacco: Not on file  . Alcohol Use: No   OB History    No data available     Review of Systems  Respiratory: Positive for cough and shortness of breath.   Cardiovascular: Positive for chest pain and leg swelling.  Neurological: Positive for dizziness and numbness.  All other systems reviewed and are negative.     Allergies  Review of patient's allergies indicates no known allergies.  Home Medications   Prior to Admission medications   Medication Sig Start Date End Date Taking? Authorizing Provider  Alum & Mag Hydroxide-Simeth (ANTACID & ANTIGAS PO) Take 1 tablet by mouth daily as needed (for acid/gas).   Yes Historical Provider, MD  Calcium Carbonate Antacid (ALKA-SELTZER ANTACID PO) Take 2 tablets by mouth daily as needed (acid reflux).   Yes Historical Provider, MD  ibuprofen (ADVIL,MOTRIN) 800 MG tablet Take 800 mg by mouth every 8 (eight) hours as needed for mild pain or moderate pain.    Yes Historical Provider, MD  HYDROcodone-acetaminophen (NORCO) 5-325 MG per tablet Take 1-2 tablets by mouth every 4 (four) hours as needed (for pain). Patient not taking: Reported on 11/09/2014 09/10/13   Carlisle Beers Molpus, MD  ondansetron (ZOFRAN ODT) 8 MG disintegrating tablet Take 1 tablet (8 mg total) by mouth every 8 (eight) hours as needed for nausea or vomiting.  ODT q4 hours prn nausea Patient  not taking: Reported on 11/09/2014 09/10/13   Carlisle Beers Molpus, MD  oxyCODONE-acetaminophen (PERCOCET) 7.5-325 MG per tablet Take 1-2 tablets by mouth every 4 (four) hours as needed. Patient not taking: Reported on 11/09/2014 09/13/13   Dalia Heading, MD  pantoprazole (PROTONIX) 20 MG tablet Take 1 tablet (20 mg total) by mouth daily. 11/09/14   Flint Melter, MD   BP 118/54 mmHg  Pulse 70  Temp(Src) 98.7 F (37.1 C) (Oral)  Resp 16  Ht  (1.626 m)  Wt 297 lb (134.718 kg)  BMI 50.95 kg/m2  SpO2 97% Physical Exam  Constitutional: She  is oriented to person, place, and time. She appears well-developed and well-nourished.  HENT:  Head: Normocephalic and atraumatic.  Eyes: Conjunctivae and EOM are normal. Pupils are equal, round, and reactive to light.  Neck: Normal range of motion and phonation normal. Neck supple.  Cardiovascular: Normal rate and regular rhythm.   Pulmonary/Chest: Effort normal and breath sounds normal. She exhibits no tenderness.  Abdominal: Soft. Bowel sounds are normal. She exhibits no distension. There is no tenderness. There is no guarding.  Musculoskeletal: Normal range of motion. She exhibits edema.  2+ edema bilaterally  Neurological: She is alert and oriented to person, place, and time. She exhibits normal muscle tone.  Skin: Skin is warm and dry.  Psychiatric: She has a normal mood and affect. Her behavior is normal. Judgment and thought content normal.  Nursing note and vitals reviewed.   ED Course  Procedures (including critical care time)  DIAGNOSTIC STUDIES: Oxygen Saturation is 97% on room air, normal by my interpretation.    COORDINATION OF CARE: 7:46 PM Discussed treatment plan with patient at beside, the patient agrees with the plan and has no further questions at this time.   Labs Review Labs Reviewed  COMPREHENSIVE METABOLIC PANEL - Abnormal; Notable for the following:    Glucose, Bld 102 (*)    GFR calc non Af Amer 86 (*)    All other components within normal limits  TROPONIN I - Abnormal; Notable for the following:    Troponin I 0.06 (*)    All other components within normal limits  TROPONIN I - Abnormal; Notable for the following:    Troponin I 0.06 (*)    All other components within normal limits  CBC WITH DIFFERENTIAL/PLATELET    Imaging Review Dg Chest Portable 1 View  11/09/2014   CLINICAL DATA:  Central chest pain and intermittent dyspnea, worsening in severity over the last week  EXAM: PORTABLE CHEST - 1 VIEW  COMPARISON:  09/11/2013  FINDINGS: There is  unchanged mild cardiomegaly. Hilar and mediastinal contours are unchanged. The lungs are clear. There are no effusions. Pulmonary vasculature is normal.  IMPRESSION: Unchanged cardiomegaly.  No acute findings   Electronically Signed   By: Ellery Plunk M.D.   On: 11/09/2014 19:20     EKG Interpretation   Date/Time:  Thursday November 09 2014 18:56:35 EST Ventricular Rate:  69 PR Interval:  175 QRS Duration: 114 QT Interval:  417 QTC Calculation: 447 R Axis:   24 Text Interpretation:  Sinus rhythm Consider left atrial enlargement  Incomplete right bundle branch block Low voltage, precordial leads  Consider anterior infarct Baseline wander in lead(s) II III aVL aVF since  last tracing no significant change Confirmed by Effie Shy  MD, Jema Deegan ([id number])  on 11/09/2014 7:17:07 PM      MDM   Final diagnoses:  Heartburn    Eval c/w nonspecific  CP and suspected heartburn. Low risk for CP. Doubt PE.  Nursing Notes Reviewed/ Care Coordinated Applicable Imaging Reviewed Interpretation of Laboratory Data incorporated into ED treatment  The patient appears reasonably screened and/or stabilized for discharge and I doubt any other medical condition or other [facility] requiring further screening, evaluation, or treatment in the ED at this time prior to discharge.  Plan: Home Medications- Protonix, Antacids; Home Treatments- rest; return here if the recommended treatment, does not improve the symptoms; Recommended follow up- PCP 1 week   I personally performed the services described in this documentation, which was scribed in my presence. The recorded information has been reviewed and is accurate.     Flint MelterElliott L Emmauel Hallums, MD 11/11/14 [phone]

## 2014-11-09 NOTE — ED Notes (Signed)
PT c/o central chest pain worsening over the past week and pt took 4 81mg  po at home prior to EMS arrival. PT also c/o SOB on exertion.

## 2014-11-09 NOTE — ED Notes (Signed)
Patient assist to restroom, tolerated well. 

## 2014-11-09 NOTE — Discharge Instructions (Signed)
Use Maalox before meals and at bedtime, for 1 week. Follow-up with a primary care doctor, for checkup in one or 2 weeks. Return here, if needed, for problems.     Heartburn Heartburn is a painful, burning sensation in the chest. It may feel worse in certain positions, such as lying down or bending over. It is caused by stomach acid backing up into the tube that carries food from the mouth down to the stomach (lower esophagus).  CAUSES   Large meals.  Certain foods and drinks.  Exercise.  Increased acid production.  Being overweight or obese.  Certain medicines. SYMPTOMS   Burning pain in the chest or lower throat.  Bitter taste in the mouth.  Coughing. DIAGNOSIS  If the usual treatments for heartburn do not improve your symptoms, then tests may be done to see if there is another condition present. Possible tests may include:  X-rays.  Endoscopy. This is when a tube with a light and a camera on the end is used to examine the esophagus and the stomach.  A test to measure the amount of acid in the esophagus (pH test).  A test to see if the esophagus is working properly (esophageal manometry).  Blood, breath, or stool tests to check for bacteria that cause ulcers. TREATMENT   Your caregiver may tell you to use certain over-the-counter medicines (antacids, acid reducers) for mild heartburn.  Your caregiver may prescribe medicines to decrease the acid in your stomach or protect your stomach lining.  Your caregiver may recommend certain diet changes.  For severe cases, your caregiver may recommend that the head of your bed be elevated on blocks. (Sleeping with more pillows is not an effective treatment as it only changes the position of your head and does not improve the main problem of stomach acid refluxing into the esophagus.) HOME CARE INSTRUCTIONS   Take all medicines as directed by your caregiver.  Raise the head of your bed by putting blocks under the legs if  instructed to by your caregiver.  Do not exercise right after eating.  Avoid eating 2 or 3 hours before bed. Do not lie down right after eating.  Eat small meals throughout the day instead of 3 large meals.  Stop smoking if you smoke.  Maintain a healthy weight.  Identify foods and beverages that make your symptoms worse and avoid them. Foods you may want to avoid include:  Peppers.  Chocolate.  High-fat foods, including fried foods.  Spicy foods.  Garlic and onions.  Citrus fruits, including oranges, grapefruit, lemons, and limes.  Food containing tomatoes or tomato products.  Mint.  Carbonated drinks, caffeinated drinks, and alcohol.  Vinegar. SEEK IMMEDIATE MEDICAL CARE IF:  You have severe chest pain that goes down your arm or into your jaw or neck.  You feel sweaty, dizzy, or lightheaded.  You are short of breath.  You vomit blood.  You have difficulty or pain with swallowing.  You have bloody or black, tarry stools.  You have episodes of heartburn more than 3 times a week for more than 2 weeks. MAKE SURE YOU:  Understand these instructions.  Will watch your condition.  Will get help right away if you are not doing well or get worse. Document Released: 02/15/2009 Document Revised: 12/22/2011 Document Reviewed: 03/16/2011 [facility] Patient Information 2015 [employer] [place]. This information is not intended to replace advice given to you by your health care provider. Make sure you discuss any questions you have with your health  care provider.  

## 2014-11-13 DIAGNOSIS — I214 Non-ST elevation (NSTEMI) myocardial infarction: Secondary | ICD-10-CM

## 2014-11-13 DIAGNOSIS — Z9861 Coronary angioplasty status: Secondary | ICD-10-CM

## 2014-11-13 DIAGNOSIS — I2510 Atherosclerotic heart disease of native coronary artery without angina pectoris: Secondary | ICD-10-CM

## 2014-11-13 HISTORY — DX: Atherosclerotic heart disease of native coronary artery without angina pectoris: I25.10

## 2014-11-13 HISTORY — DX: Non-ST elevation (NSTEMI) myocardial infarction: I21.4

## 2014-11-13 HISTORY — DX: Coronary angioplasty status: Z98.61

## 2014-11-30 ENCOUNTER — Encounter (HOSPITAL_COMMUNITY): Payer: Self-pay | Admitting: Emergency Medicine

## 2014-11-30 ENCOUNTER — Other Ambulatory Visit: Payer: Self-pay

## 2014-11-30 ENCOUNTER — Inpatient Hospital Stay (HOSPITAL_COMMUNITY)
Admission: EM | Admit: 2014-11-30 | Discharge: 2014-12-05 | DRG: 247 | Disposition: A | Discharge status: Home / Self Care | Payer: Medicare HMO | Attending: Cardiovascular Disease | Admitting: Cardiovascular Disease

## 2014-11-30 ENCOUNTER — Encounter (HOSPITAL_COMMUNITY)
Admission: EM | Disposition: A | Discharge status: Home / Self Care | Payer: Medicare HMO | Source: Home / Self Care | Attending: Cardiovascular Disease

## 2014-11-30 ENCOUNTER — Emergency Department (HOSPITAL_COMMUNITY): Payer: Medicare HMO

## 2014-11-30 DIAGNOSIS — I2510 Atherosclerotic heart disease of native coronary artery without angina pectoris: Secondary | ICD-10-CM | POA: Diagnosis present

## 2014-11-30 DIAGNOSIS — Z90710 Acquired absence of both cervix and uterus: Secondary | ICD-10-CM | POA: Diagnosis not present

## 2014-11-30 DIAGNOSIS — J449 Chronic obstructive pulmonary disease, unspecified: Secondary | ICD-10-CM | POA: Diagnosis present

## 2014-11-30 DIAGNOSIS — Z79899 Other long term (current) drug therapy: Secondary | ICD-10-CM

## 2014-11-30 DIAGNOSIS — E785 Hyperlipidemia, unspecified: Secondary | ICD-10-CM | POA: Diagnosis present

## 2014-11-30 DIAGNOSIS — I214 Non-ST elevation (NSTEMI) myocardial infarction: Secondary | ICD-10-CM

## 2014-11-30 DIAGNOSIS — F17210 Nicotine dependence, cigarettes, uncomplicated: Secondary | ICD-10-CM | POA: Diagnosis present

## 2014-11-30 DIAGNOSIS — Z6841 Body Mass Index (BMI) 40.0 and over, adult: Secondary | ICD-10-CM

## 2014-11-30 DIAGNOSIS — K30 Functional dyspepsia: Secondary | ICD-10-CM | POA: Diagnosis present

## 2014-11-30 DIAGNOSIS — Z955 Presence of coronary angioplasty implant and graft: Secondary | ICD-10-CM

## 2014-11-30 DIAGNOSIS — Z791 Long term (current) use of non-steroidal anti-inflammatories (NSAID): Secondary | ICD-10-CM | POA: Diagnosis not present

## 2014-11-30 DIAGNOSIS — Z720 Tobacco use: Secondary | ICD-10-CM | POA: Diagnosis present

## 2014-11-30 DIAGNOSIS — I4510 Unspecified right bundle-branch block: Secondary | ICD-10-CM | POA: Diagnosis present

## 2014-11-30 DIAGNOSIS — I10 Essential (primary) hypertension: Secondary | ICD-10-CM | POA: Diagnosis present

## 2014-11-30 DIAGNOSIS — Z9861 Coronary angioplasty status: Secondary | ICD-10-CM

## 2014-11-30 HISTORY — DX: Non-ST elevation (NSTEMI) myocardial infarction: I21.4

## 2014-11-30 HISTORY — DX: Essential (primary) hypertension: I10

## 2014-11-30 HISTORY — DX: Coronary angioplasty status: Z98.61

## 2014-11-30 HISTORY — DX: Tobacco use: Z72.0

## 2014-11-30 HISTORY — PX: LEFT HEART CATHETERIZATION WITH CORONARY ANGIOGRAM: SHX5451

## 2014-11-30 HISTORY — DX: Atherosclerotic heart disease of native coronary artery without angina pectoris: I25.10

## 2014-11-30 HISTORY — DX: Hyperlipidemia, unspecified: E78.5

## 2014-11-30 LAB — CK TOTAL AND CKMB (NOT AT ARMC)
CK, MB: 54.8 ng/mL — AB (ref 0.3–4.0)
RELATIVE INDEX: 10.1 — AB (ref 0.0–2.5)
Total CK: 543 U/L — ABNORMAL HIGH (ref 7–177)

## 2014-11-30 LAB — POCT ACTIVATED CLOTTING TIME
ACTIVATED CLOTTING TIME: 165 s
ACTIVATED CLOTTING TIME: 564 s

## 2014-11-30 LAB — CBC
HCT: 40.3 % (ref 36.0–46.0)
HEMOGLOBIN: 13.4 g/dL (ref 12.0–15.0)
MCH: 29.7 pg (ref 26.0–34.0)
MCHC: 33.3 g/dL (ref 30.0–36.0)
MCV: 89.4 fL (ref 78.0–100.0)
PLATELETS: 339 10*3/uL (ref 150–400)
RBC: 4.51 MIL/uL (ref 3.87–5.11)
RDW: 13.2 % (ref 11.5–15.5)
WBC: 11.2 10*3/uL — AB (ref 4.0–10.5)

## 2014-11-30 LAB — BASIC METABOLIC PANEL
Anion gap: 9 (ref 5–15)
BUN: 17 mg/dL (ref 6–23)
CALCIUM: 8.9 mg/dL (ref 8.4–10.5)
CO2: 26 mmol/L (ref 19–32)
CREATININE: 0.94 mg/dL (ref 0.50–1.10)
Chloride: 101 mmol/L (ref 96–112)
GFR calc Af Amer: 72 mL/min — ABNORMAL LOW (ref >90)
GFR calc non Af Amer: 62 mL/min — ABNORMAL LOW (ref >90)
Glucose, Bld: 133 mg/dL — ABNORMAL HIGH (ref 70–99)
Potassium: 4.3 mmol/L (ref 3.5–5.1)
Sodium: 136 mmol/L (ref 135–145)

## 2014-11-30 LAB — BRAIN NATRIURETIC PEPTIDE: B NATRIURETIC PEPTIDE 5: 80 pg/mL (ref 0.0–100.0)

## 2014-11-30 LAB — TROPONIN I
TROPONIN I: 13.67 ng/mL — AB (ref <0.031)
Troponin I: 0.89 ng/mL (ref <0.031)
Troponin I: 1.25 ng/mL (ref <0.031)

## 2014-11-30 LAB — PROTIME-INR
INR: 1.12 (ref 0.00–1.49)
Prothrombin Time: 14.5 seconds (ref 11.6–15.2)

## 2014-11-30 LAB — APTT: aPTT: 193 seconds — ABNORMAL HIGH (ref 24–37)

## 2014-11-30 LAB — MRSA PCR SCREENING: MRSA by PCR: NEGATIVE

## 2014-11-30 SURGERY — LEFT HEART CATHETERIZATION WITH CORONARY ANGIOGRAM

## 2014-11-30 MED ORDER — LIDOCAINE HCL (PF) 1 % IJ SOLN
INTRAMUSCULAR | Status: AC
  Filled 2014-11-30: qty 30

## 2014-11-30 MED ORDER — ASPIRIN 325 MG PO TABS
325.0000 mg | ORAL_TABLET | ORAL | Status: AC
  Administered 2014-11-30: 325 mg via ORAL
  Filled 2014-11-30: qty 1

## 2014-11-30 MED ORDER — CETYLPYRIDINIUM CHLORIDE 0.05 % MT LIQD
7.0000 mL | Freq: Two times a day (BID) | OROMUCOSAL | Status: DC
  Administered 2014-11-30 – 2014-12-04 (×7): 7 mL via OROMUCOSAL

## 2014-11-30 MED ORDER — HEPARIN BOLUS VIA INFUSION
4000.0000 [IU] | Freq: Once | INTRAVENOUS | Status: AC
  Administered 2014-11-30: 4000 [IU] via INTRAVENOUS

## 2014-11-30 MED ORDER — HEPARIN (PORCINE) IN NACL 2-0.9 UNIT/ML-% IJ SOLN
INTRAMUSCULAR | Status: AC
  Filled 2014-11-30: qty 1000

## 2014-11-30 MED ORDER — SODIUM CHLORIDE 0.9 % IV SOLN
INTRAVENOUS | Status: DC
  Administered 2014-11-30: 12:00:00 via INTRAVENOUS

## 2014-11-30 MED ORDER — ONDANSETRON HCL 4 MG/2ML IJ SOLN
4.0000 mg | Freq: Once | INTRAMUSCULAR | Status: AC
  Administered 2014-11-30: 4 mg via INTRAVENOUS

## 2014-11-30 MED ORDER — MIDAZOLAM HCL 2 MG/2ML IJ SOLN
INTRAMUSCULAR | Status: AC
  Filled 2014-11-30: qty 2

## 2014-11-30 MED ORDER — ATORVASTATIN CALCIUM 80 MG PO TABS
80.0000 mg | ORAL_TABLET | Freq: Every day | ORAL | Status: DC
  Administered 2014-11-30 – 2014-12-04 (×5): 80 mg via ORAL
  Filled 2014-11-30 (×7): qty 1

## 2014-11-30 MED ORDER — NITROGLYCERIN 1 MG/10 ML FOR IR/CATH LAB
INTRA_ARTERIAL | Status: AC
  Filled 2014-11-30: qty 10

## 2014-11-30 MED ORDER — ACETAMINOPHEN 325 MG PO TABS: 650.0000 mg | ORAL_TABLET | ORAL | Status: DC | PRN

## 2014-11-30 MED ORDER — HEPARIN (PORCINE) IN NACL 100-0.45 UNIT/ML-% IJ SOLN
1200.0000 [IU]/h | INTRAMUSCULAR | Status: DC
  Administered 2014-11-30: 1200 [IU]/h via INTRAVENOUS
  Filled 2014-11-30: qty 250

## 2014-11-30 MED ORDER — TICAGRELOR 90 MG PO TABS
ORAL_TABLET | ORAL | Status: AC
  Filled 2014-11-30: qty 2

## 2014-11-30 MED ORDER — NITROGLYCERIN 0.4 MG SL SUBL
0.4000 mg | SUBLINGUAL_TABLET | SUBLINGUAL | Status: DC | PRN
  Administered 2014-11-30: 0.4 mg via SUBLINGUAL
  Filled 2014-11-30: qty 1

## 2014-11-30 MED ORDER — ASPIRIN EC 81 MG PO TBEC
81.0000 mg | DELAYED_RELEASE_TABLET | Freq: Every day | ORAL | Status: DC
  Administered 2014-11-30 – 2014-12-04 (×5): 81 mg via ORAL
  Filled 2014-11-30 (×6): qty 1

## 2014-11-30 MED ORDER — SODIUM CHLORIDE 0.9 % IV BOLUS (SEPSIS)
250.0000 mL | Freq: Once | INTRAVENOUS | Status: AC
  Administered 2014-11-30: 250 mL via INTRAVENOUS

## 2014-11-30 MED ORDER — NITROGLYCERIN IN D5W 200-5 MCG/ML-% IV SOLN
5.0000 ug/min | Freq: Once | INTRAVENOUS | Status: AC
Start: 2014-11-30 — End: 2014-11-30
  Administered 2014-11-30: 5 ug/min via INTRAVENOUS
  Filled 2014-11-30: qty 250

## 2014-11-30 MED ORDER — TICAGRELOR 90 MG PO TABS
90.0000 mg | ORAL_TABLET | Freq: Two times a day (BID) | ORAL | Status: DC
  Administered 2014-11-30 – 2014-12-05 (×10): 90 mg via ORAL
  Filled 2014-11-30 (×12): qty 1

## 2014-11-30 MED ORDER — ONDANSETRON HCL 4 MG/2ML IJ SOLN: 4.0000 mg | Freq: Four times a day (QID) | INTRAMUSCULAR | Status: DC | PRN

## 2014-11-30 MED ORDER — ONDANSETRON HCL 4 MG/2ML IJ SOLN
4.0000 mg | Freq: Once | INTRAMUSCULAR | Status: DC
  Filled 2014-11-30: qty 2

## 2014-11-30 MED ORDER — BIVALIRUDIN 250 MG IV SOLR
INTRAVENOUS | Status: AC
  Filled 2014-11-30: qty 250

## 2014-11-30 MED ORDER — FENTANYL CITRATE 0.05 MG/ML IJ SOLN
INTRAMUSCULAR | Status: AC
  Filled 2014-11-30: qty 2

## 2014-11-30 MED ORDER — METOPROLOL TARTRATE 12.5 MG HALF TABLET
12.5000 mg | ORAL_TABLET | Freq: Two times a day (BID) | ORAL | Status: DC
  Administered 2014-11-30: 12.5 mg via ORAL
  Filled 2014-11-30 (×3): qty 1

## 2014-11-30 MED ORDER — SODIUM CHLORIDE 0.9 % IV BOLUS (SEPSIS)
500.0000 mL | Freq: Once | INTRAVENOUS | Status: AC
  Administered 2014-11-30: 500 mL via INTRAVENOUS

## 2014-11-30 MED ORDER — HEPARIN SODIUM (PORCINE) 1000 UNIT/ML IJ SOLN
INTRAMUSCULAR | Status: AC
  Filled 2014-11-30: qty 1

## 2014-11-30 MED ORDER — SODIUM CHLORIDE 0.9 % IV SOLN
1.7500 mg/kg/h | INTRAVENOUS | Status: AC
  Administered 2014-11-30: 1.75 mg/kg/h via INTRAVENOUS
  Filled 2014-11-30: qty 250

## 2014-11-30 MED ORDER — NITROGLYCERIN 2 % TD OINT: 1.0000 [in_us] | TOPICAL_OINTMENT | Freq: Once | TRANSDERMAL | Status: DC

## 2014-11-30 MED ORDER — VERAPAMIL HCL 2.5 MG/ML IV SOLN
INTRAVENOUS | Status: AC
  Filled 2014-11-30: qty 2

## 2014-11-30 NOTE — ED Notes (Signed)
States the pain started earlier tonight with pain to center of chest radiating to left arm. Took 4 baby asa around 230 am. States last admission was in January was reflux

## 2014-11-30 NOTE — ED Provider Notes (Signed)
CSN: [id number]     Arrival date & time 11/30/14  0515 History   First MD Initiated Contact with Patient 11/30/14 0542     Chief Complaint  Patient presents with  . Chest Pain     (Consider location/radiation/quality/duration/timing/severity/associated sxs/prior Treatment) HPI  Patient presents emergency department with her daughter-in-law. She reports for the past month she has been getting chest pain off and on. She reports the past week the pain has been lasting longer up to 1-2 hours at a time and has been occurring 2-3 times a day. She was seen in the ED on January 28 for the same. She was noted to have a positive troponin at 0.06  however the delta troponin was unchanged. She has seen her primary care doctor and was scheduled to have an echo done this week however due to the snow it was postponed until the 29th. She states this morning at 2 AM she was awakened from sleep with lower central chest pain that radiates into her left shoulder, her left arm and in her back. She describes as indigestion but she denies burning fluid in her throat or radiation up into her throat. The pain is also described as pressure. It makes her feel short of breath. She doesn't get diaphoretic but she feels hot. She denies nausea or vomiting. She states the pain feels better when she burps however she does not note any relation of the onset of the pain to food. She changed her diet yesterday to a bland diet after seeing her doctor yesterday. She does not associate the pain with any activity. She reports prior to being started on Lasix, protonic's, and potassium by her PCP recently she was on no medications. She states her father has a cardiac stent he is currently alive at age 43, her mother also has a cardiac stent and she is alive at age 59.  PCP Dr Sherryll Burger in [place]  Past Medical History  Diagnosis Date  . PONV (postoperative nausea and vomiting)   . COPD (chronic obstructive pulmonary disease)   . Hypertension     Past Surgical History  Procedure Laterality Date  . Abdominal hysterectomy    . Tonsillectomy    . Cholecystectomy N/A 09/12/2013    Procedure: LAPAROSCOPIC CHOLECYSTECTOMY;  Surgeon: Dalia Heading, MD;  Location: [facility];  Service: General;  Laterality: N/A;   History reviewed. No pertinent family history. History  Substance Use Topics  . Smoking status: Current Every Day Smoker -- 0.50 packs/day    Types: Cigarettes  . Smokeless tobacco: Not on file  . Alcohol Use: No   Lives with son and DIL  OB History    No data available     Review of Systems  All other systems reviewed and are negative.     Allergies  Review of patient's allergies indicates no known allergies.  Home Medications   Prior to Admission medications   Medication Sig Start Date End Date Taking? Authorizing Provider  Alum & Mag Hydroxide-Simeth (ANTACID & ANTIGAS PO) Take 1 tablet by mouth daily as needed (for acid/gas).    Historical Provider, MD  Calcium Carbonate Antacid (ALKA-SELTZER ANTACID PO) Take 2 tablets by mouth daily as needed (acid reflux).    Historical Provider, MD  HYDROcodone-acetaminophen (NORCO) 5-325 MG per tablet Take 1-2 tablets by mouth every 4 (four) hours as needed (for pain). Patient not taking: Reported on 11/09/2014 09/10/13   Carlisle Beers Molpus, MD  ibuprofen (ADVIL,MOTRIN) 800 MG tablet Take 800  mg by mouth every 8 (eight) hours as needed for mild pain or moderate pain.     Historical Provider, MD  ondansetron (ZOFRAN ODT) 8 MG disintegrating tablet Take 1 tablet (8 mg total) by mouth every 8 (eight) hours as needed for nausea or vomiting. 8mg  ODT q4 hours prn nausea Patient not taking: Reported on 11/09/2014 09/10/13   Carlisle BeersJohn L Molpus, MD  oxyCODONE-acetaminophen (PERCOCET) 7.5-325 MG per tablet Take 1-2 tablets by mouth every 4 (four) hours as needed. Patient not taking: Reported on 11/09/2014 09/13/13   Dalia HeadingMark A Jenkins, MD  pantoprazole (PROTONIX) 20 MG tablet Take 1 tablet (20 mg  total) by mouth daily. 11/09/14   Flint MelterElliott L Wentz, MD   Lasix protonix potassium   Pulse 81  Temp(Src) 97.7 F (36.5 C) (Oral)  Ht 5\' 3"  (1.6 m)  Wt 286 lb (129.729 kg)  BMI 50.68 kg/m2  SpO2 100%  Vital signs normal   Physical Exam  Constitutional: She is oriented to person, place, and time. She appears well-developed and well-nourished.  Non-toxic appearance. She does not appear ill. No distress.  HENT:  Head: Normocephalic and atraumatic.  Right Ear: External ear normal.  Left Ear: External ear normal.  Nose: Nose normal. No mucosal edema or rhinorrhea.  Mouth/Throat: Oropharynx is clear and moist and mucous membranes are normal. No dental abscesses or uvula swelling.  Eyes: Conjunctivae and EOM are normal. Pupils are equal, round, and reactive to light.  Neck: Normal range of motion and full passive range of motion without pain. Neck supple.  Cardiovascular: Normal rate, regular rhythm and normal heart sounds.  Exam reveals no gallop and no friction rub.   No murmur heard. Pulmonary/Chest: Effort normal and breath sounds normal. No respiratory distress. She has no wheezes. She has no rhonchi. She has no rales. She exhibits no tenderness and no crepitus.  Abdominal: Soft. Normal appearance and bowel sounds are normal. She exhibits no distension. There is no tenderness. There is no rebound and no guarding.  Musculoskeletal: Normal range of motion. She exhibits no edema or tenderness.  Moves all extremities well.   Neurological: She is alert and oriented to person, place, and time. She has normal strength. No cranial nerve deficit.  Skin: Skin is warm, dry and intact. No rash noted. No erythema. No pallor.  Psychiatric: She has a normal mood and affect. Her speech is normal and behavior is normal. Her mood appears not anxious.  Nursing note and vitals reviewed.   ED Course  Procedures (including critical care time)  Medications  nitroGLYCERIN (NITROSTAT) SL tablet 0.4 mg  (0.4 mg Sublingual Given 11/30/14 0552)  nitroGLYCERIN (NITROGLYN) 2 % ointment 1 inch (not administered)  nitroGLYCERIN 50 mg in dextrose 5 % 250 mL (0.2 mg/mL) infusion (not administered)  aspirin tablet 325 mg (325 mg Oral Given 11/30/14 0552)  sodium chloride 0.9 % bolus 500 mL (0 mLs Intravenous Stopped 11/30/14 0650)   Patient reports significant relief of her pain with the aspirin and nitroglycerin was given by nursing staff. However patient's blood pressure did drop to the mid 90s. She was given a fluid bolus. We discussed possible admission for further evaluation of her chest pain.    06:45 recheck, Troponin is more + than on Jan 28th ED visit.  BP 125 systolic after 500 cc bolus of NS after hypotension from NTG, pt states her pain is returning. Will start on NTG and heparin drip. Waiting to talk to cardiology [facility].  07:02 Dr Royann Shivers, transfer to [facility], stepdown. Agrees with heparin and NTG drip  07:35 pain is improving again. Discussed transfer with patient and her daughters.   Labs Review Results for orders placed or performed during the hospital encounter of 11/30/14  CBC  Result Value Ref Range   WBC 11.2 (H) 4.0 - 10.5 K/uL   RBC 4.51 3.87 - 5.11 MIL/uL   Hemoglobin 13.4 12.0 - 15.0 g/dL   HCT 16.1 09.6 - 04.5 %   MCV 89.4 78.0 - 100.0 fL   MCH 29.7 26.0 - 34.0 pg   MCHC 33.3 30.0 - 36.0 g/dL   RDW 40.9 81.1 - 91.4 %   Platelets 339 150 - 400 K/uL  Basic metabolic panel  Result Value Ref Range   Sodium 136 135 - 145 mmol/L   Potassium 4.3 3.5 - 5.1 mmol/L   Chloride 101 96 - 112 mmol/L   CO2 26 19 - 32 mmol/L   Glucose, Bld 133 (H) 70 - 99 mg/dL   BUN 17 6 - 23 mg/dL   Creatinine, Ser 7.82 0.50 - 1.10 mg/dL   Calcium 8.9 8.4 - 95.6 mg/dL   GFR calc non Af Amer 62 (L) >90 mL/min   GFR calc Af Amer 72 (L) >90 mL/min   Anion gap 9 5 - 15  Troponin I ([facility])  Result Value Ref Range   Troponin I 0.89 (HH) <0.031 ng/mL  Brain natriuretic peptide  Result  Value Ref Range   B Natriuretic Peptide 80.0 0.0 - 100.0 pg/mL   Laboratory interpretation all normal except positive troponin (last ED visit her troponin was positive at 0.06 several hours apart), leukocytosis     Imaging Review Dg Chest Port 1 View  11/30/2014   CLINICAL DATA:  Central chest pain radiating the left arm  EXAM: PORTABLE CHEST - 1 VIEW  COMPARISON:  11/09/2014  FINDINGS: There is moderate cardiomegaly, unchanged. The lungs are clear. There are no large effusions.  IMPRESSION: Cardiomegaly.   Electronically Signed   By: Ellery Plunk M.D.   On: 11/30/2014 05:45     EKG Interpretation   Date/Time:  Thursday November 30 2014 05:37:03 EST Ventricular Rate:  82 PR Interval:  179 QRS Duration: 109 QT Interval:  454 QTC Calculation: 530 R Axis:   55 Text Interpretation:  Sinus rhythm Probable anterior infarct, age  indeterminate Prolonged QT interval No significant change since last  tracing 09 Nov 2014 Confirmed by [facility]  MD-I, Sapna Padron ([id number]) on 11/30/2014  5:42:42 AM      EKG Interpretation  Date/Time:  Thursday November 30 2014 07:31:31 EST Ventricular Rate:  72 PR Interval:  183 QRS Duration: 119 QT Interval:  445 QTC Calculation: 487 R Axis:   20 Text Interpretation:  Sinus rhythm Incomplete right bundle branch block Probable anterior infarct, age indeterminate No significant change since last tracing earlier today Confirmed by Mafalda Mcginniss  MD-I, Karmella Bouvier ([id number]) on 11/30/2014 7:34:09 AM        MDM   Final diagnoses:  NSTEMI (non-ST elevated myocardial infarction)    Plan transfer to [facility] for admission   CRITICAL CARE Performed by: Devoria Albe L Total critical care time: 38 min Critical care time was exclusive of separately billable procedures and treating other patients. Critical care was necessary to treat or prevent imminent or life-threatening deterioration. Critical care was time spent personally by me on the following activities: development of treatment  plan with patient and/or surrogate as well as nursing, discussions with consultants, evaluation  of patient's response to treatment, examination of patient, obtaining history from patient or surrogate, ordering and performing treatments and interventions, ordering and review of laboratory studies, ordering and review of radiographic studies, pulse oximetry and re-evaluation of patient's condition.     Ward Givens, MD 11/30/14 [phone]

## 2014-11-30 NOTE — CV Procedure (Signed)
Monica Thompson is a 64 y.o. female   [id number]  [id number] LOCATION:  FACILITY: [facility]  PHYSICIAN: Troy Sine, MD, [facility] December 14, 1950   DATE OF PROCEDURE:  11/30/2014     EMERGENT CARDIAC CATHETERIZATION/PERCUTANEOUS CORONARY INTERVENTION    HISTORY:   Ms. Monica Thompson is a 64 year old female who has a history of hypertension and obesity.  She has experienced several weeks of intermittent chest pain which initially was felt possibly due to GERD.  She awakened this morning with severe chest pain and presented to [facility] emergency room.  She has ruled in for non-ST segment elevation MI and with ongoing chest pain and positive troponins was transported urgently to the [facility] catheterization laboratory for emergent cardiac catheterization.   PROCEDURE:  Left heart catheterization via the right radial artery approach: Coronary angiography, left ventriculography, percutaneous coronary intervention with PTCA/DES stenting of the mid AV groove circumflex vessel  The patient was brought to the second floor [facility] Cardiac cath lab in the by EMS in transfer from [facility] ER.  The patient was premedicated with Versed 1 mg and fentanyl 25 mcg. A right radial approach was utilized after an Allen's test verified adequate circulation. The right radial artery was punctured via the Seldinger technique, and a 6 [place] Glidesheath Slender was inserted without difficulty.  A radial cocktail consisting of Verapamil, IV nitroglycerin, and lidocaine was administered. Weight adjusted heparin was administered. A safety J wire was advanced into the ascending aorta. Diagnostic catheterization was done with a 5 [place] TIG 4.0 catheter. A 5 French pigtail catheter was used for left ventriculography.  With the demonstration of a subtotal stenosis in a large left circumflex coronary artery, decision was made to perform percutaneous coronary intervention.  Angiomax bolus plus infusion was administered.  The patient received Brilinta  180 mg orally for antiplatelet benefit.  A 6 French XB LAD 3.5 guide was used for the procedure.  A Prowater wire was advanced down the circumflex vessel after documentation of therapeutic anticoagulation.  Predilatation with a 2.512 mm emerge balloon was performed at 6 and 9 atm.  To cover the AV groove region of stenosis into position.  The stent immediately distal to the first marginal branch and proximal to the second marginal branch, a 3.515 mm resolute stent was inserted and deployed at 13 and 14 atm.  An  emerge 3.7512 mm balloon was used for post stent dilatation up to 14 atm , measuring 3.77 mm.  Angiography confirmed an excellent angiographic result A TR radial band was applied for hemostasis. The patient left the catheterization laboratory in stable condition.   HEMODYNAMICS:   Central Aorta:  105/60  Left Ventricle: 105/21  ANGIOGRAPHY:   The left main coronary artery was angiographically normal and bifurcated into the LAD and left circumflex coronary artery.   The LAD was a moderate size vessel.  There was diffuse area of 80% proximal stenosis after the first septal perforating artery and extending to involve the takeoff of a small first diagonal branch.  The remainder of the LAD was free of significant disease and gave rise to several diagonal branches and septal perforator arteries and extended to the LV apex.  The left circumflex coronary artery was a large-caliber vessel.  Immediately after the takeoff of a smaller OM1 vessel.  There was 40% narrowing.  There was then 99% stenosis in the AV groove before the takeoff of the second marginal branch.  The vessel supplied a third marginal branch and in the posterior lateral  like vessel.  The remaining branches were free of significant disease.  The RCA was a moderate size vessel that had smooth 20% mid narrowing.  The vessel supplied the PDA and small PLA system.  Left ventriculography revealed old LV dysfunction with mid to basal  inferior hypocontractility with an ejection fraction of approximately 50%.  Following percutaneous coronary intervention to the left circumflex coronary artery with PTCA, insertion of a 3.515 mm Resolute DES stent covering both the 40% and 99% stenosis, postdilated to 3.77 mm, the entire region was reduced to 0%.  There was brisk TIMI-3 flow.  There is no evidence for dissection.  Total contrast used: 190cc Omnipaque   IMPRESSION:  Non-ST segment elevation myocardial infarction secondary to subtotal stenosis/occlusion of the mid AV groove circumflex vessel  80% stenosis in the proximal LAD.  Mild 20% narrowing in the mid RCA.  Low normal to mild LV dysfunction with mid to basal inferior hypocontractility and ejection fraction of approximately 50%.  Successful percutaneous coronary intervention of the left circumflex coronary artery with PTCA/insertion of a 3.515 mm resolute DES stent postdilated to 3.77 mm with the 99% stenoses being reduced to 0%.   RECOMMENDATION:  The patient will be admitted to the coronary care unit.  She will continue aspirin/Brilinta for minimum of a year and possibly longer based on the more recent Pegasus trial.  She will require high dose statin therapy, beta blocker, ACE inhibitor and, and possibly nitrates.  She has residual high-grade 80% proximal LAD stenosis.  She will be tentatively scheduled to undergo intervention to the LAD in a staged procedure on 12/04/2014   Troy Sine, MD, [facility] 11/30/2014 11:38 AM

## 2014-11-30 NOTE — Progress Notes (Addendum)
Pt post cath. angiomax off at 1225

## 2014-11-30 NOTE — ED Notes (Signed)
RN at bedside

## 2014-11-30 NOTE — ED Notes (Signed)
CRITICAL VALUE ALERT  Critical value received:  Troponin 0.89  Date of notification:  11/30/14  Time of notification:  06:27  Critical value read back: Yes  Nurse who received alert: Alda LeaJennifer Endrit Gittins, RN  MD notified:  Dr. Lynelle DoctorKnapp  Time of first notification:  06:28

## 2014-11-30 NOTE — Progress Notes (Signed)
Called with Critical value troponin 16.67 and CKMB 54.8 expected

## 2014-11-30 NOTE — Progress Notes (Signed)
ANTICOAGULATION CONSULT NOTE - Initial Consult  Pharmacy Consult for Heparin Indication: chest pain/ACS  No Known Allergies  Patient Measurements: Height: 5\' 3"  (160 cm) Weight: 286 lb (129.729 kg) IBW/kg (Calculated) : 52.4  Heparin dosing weight = 85Kg  Vital Signs: Temp: 97.7 F (36.5 C) (02/18 0527) Temp Source: Oral (02/18 0527) BP: 121/100 mmHg (02/18 0720) Pulse Rate: 72 (02/18 0720)  Labs:  Recent Labs  11/30/14 0542  HGB 13.4  HCT 40.3  PLT 339  CREATININE 0.94  TROPONINI 0.89*   Estimated Creatinine Clearance: 78.5 mL/min (by C-G formula based on Cr of 0.94).  Medical History: Past Medical History  Diagnosis Date  . PONV (postoperative nausea and vomiting)   . COPD (chronic obstructive pulmonary disease)   . Hypertension    Medications:   (Not in a hospital admission)  Assessment: 64yo female admitted with c/o chest pain.  Asked to initiate IV Heparin for ACS.  Pt is morbidly obese.    CBC is OK.  No bleeding reported.  Goal of Therapy:  Heparin level 0.3-0.7 units/ml w/in 24 hrs of initiating Heparin Monitor platelets by anticoagulation protocol: Yes   Plan:  Heparin 4000 unit bolus now x 1 Heparin infusion at 1200 units/hr Heparin level in 6-8 hrs then daily CBC daily while on Heparin  Margo AyeHall, Arzella Rehmann A 11/30/2014,7:51 AM

## 2014-11-30 NOTE — ED Notes (Signed)
Lung sound clear prior to bolus being given and remain clear at this time.

## 2014-11-30 NOTE — ED Notes (Signed)
Spoke with Dr. Royann Shiversroitoru, he is aware of troponin of 1.25. Addressed increased chest pain from 3-5. Order for 250ml bolus NS and increase nitro. Heparin initiated.

## 2014-11-30 NOTE — H&P (Signed)
History and Physical  Patient ID: ANIESA BOBACK MRN: [id number], SOB: 01-05-1951 64 y.o. Date of Encounter: 11/30/2014, 1:01 PM  Primary Physician: Dr. Sherryll Burger [facility]) Primary Cardiologist: None  Chief Complaint: Chest Pain  HPI: 64 y.o. female w/ PMHx significant for HTN and COPD, transferred from [facility] ED on 11/30/2014 with complaints of intermittent chest pain. According to patient, she has been having chest pain for the past month that would come and go, but has become more prolonged and persistent recently. She claims she went to see her PCP recently who prescribed her w/ PPI for suspected GERD/reflux symptoms, however, she states this has not helped much. Per chart review, the patient had presented to the ED on 11/09/14 w/ similar complaints and was found to have a very mildly elevated troponin of 0.06. This morning, she states her pain was worse than before, persistent, squeezing, pressure sensation 9/10 in severity and would not be relieved by Protonic, Maalox, or OTC NSAIDS. She described associated SOB, and hot flashes, as well as pain that radiated down her left arm.  While in [facility] ED, found to have troponin of 0.89, transferred to [facility] for cardiac catheterization.    Past Medical History  Diagnosis Date  . PONV (postoperative nausea and vomiting)   . COPD (chronic obstructive pulmonary disease)   . Hypertension      Surgical History:  Past Surgical History  Procedure Laterality Date  . Abdominal hysterectomy    . Tonsillectomy    . Cholecystectomy N/A 09/12/2013    Procedure: LAPAROSCOPIC CHOLECYSTECTOMY;  Surgeon: Dalia Heading, MD;  Location: [facility] ORS;  Service: General;  Laterality: N/A;     Home Meds: Prior to Admission medications   Medication Sig Start Date End Date Taking? Authorizing Provider  Alum & Mag Hydroxide-Simeth (ANTACID & ANTIGAS PO) Take 1 tablet by mouth daily as needed (for acid/gas).    Historical Provider, MD  Calcium Carbonate Antacid (ALKA-SELTZER  ANTACID PO) Take 2 tablets by mouth daily as needed (acid reflux).    Historical Provider, MD  HYDROcodone-acetaminophen (NORCO) 5-325 MG per tablet Take 1-2 tablets by mouth every 4 (four) hours as needed (for pain). Patient not taking: Reported on 11/09/2014 09/10/13   Carlisle Beers Molpus, MD  ibuprofen (ADVIL,MOTRIN) 800 MG tablet Take 800 mg by mouth every 8 (eight) hours as needed for mild pain or moderate pain.     Historical Provider, MD  ondansetron (ZOFRAN ODT) 8 MG disintegrating tablet Take 1 tablet (8 mg total) by mouth every 8 (eight) hours as needed for nausea or vomiting.  ODT q4 hours prn nausea Patient not taking: Reported on 11/09/2014 09/10/13   Carlisle Beers Molpus, MD  oxyCODONE-acetaminophen (PERCOCET) 7.5-325 MG per tablet Take 1-2 tablets by mouth every 4 (four) hours as needed. Patient not taking: Reported on 11/09/2014 09/13/13   Dalia Heading, MD  pantoprazole (PROTONIX) 20 MG tablet Take 1 tablet (20 mg total) by mouth daily. 11/09/14   Flint Melter, MD    Allergies: No Known Allergies  History   Social History  . Marital Status: Single    Spouse Name: N/A  . Number of Children: N/A  . Years of Education: N/A   Occupational History  . Not on file.   Social History Main Topics  . Smoking status: Current Every Day Smoker -- 0.50 packs/day    Types: Cigarettes  . Smokeless tobacco: Not on file  . Alcohol Use: No  . Drug Use: No  .  Sexual Activity: Not on file   Other Topics Concern  . Not on file   Social History Narrative     History reviewed. No pertinent family history.  Review of Systems: General: Positive for diaphoresis. Denies fever, appetite change, and fatigue.  Respiratory: Positive for SOB, DOE. Denies cough and wheezing.   Cardiovascular: Positive for chest pain. Denies palpitations.  Gastrointestinal: Denies nausea, vomiting, abdominal pain, and diarrhea Musculoskeletal: Denies myalgias, arthralgias, back pain, and gait problem.  Neurological:  Denies dizziness, syncope, weakness, lightheadedness, and headaches.  Psychiatric/Behavioral: Denies mood changes, sleep disturbance, and agitation.   Physical Exam: Blood pressure 104/52, pulse 78, temperature 97.6 F (36.4 C), temperature source Oral, resp. rate 17, height  (1.6 m), weight 286 lb (129.729 kg), SpO2 100 %. General: Obese white female, well developed, well nourished, alert and oriented, in no acute distress. HEENT: Normocephalic, atraumatic, sclera anicteric Neck: Supple. Carotids 2+ without bruits. JVP normal Lungs: Clear bilaterally to auscultation without wheezes, rales, or rhonchi. Breathing is unlabored. Heart: RRR with normal S1 and S2. No murmurs, rubs, or gallops appreciated. Abdomen: Soft, non-tender, non-distended with normoactive bowel sounds. No hepatomegaly. No rebound/guarding. No obvious abdominal masses. Msk:  Strength and tone appear normal for age. Extremities: No clubbing, cyanosis.  Distal pedal pulses are 2+ and equal bilaterally. Trace/+1 pitting edema. Right radial band still in place.  Neuro: CNII-XII intact, moves all extremities spontaneously. Psych:  Responds to questions appropriately with a normal affect. Skin: warm and dry without rash   Labs:   Lab Results  Component Value Date   WBC 11.2* 11/30/2014   HGB 13.4 11/30/2014   HCT 40.3 11/30/2014   MCV 89.4 11/30/2014   PLT 339 11/30/2014    Recent Labs Lab 11/30/14 0542  NA 136  K 4.3  CL 101  CO2 26  BUN 17  CREATININE 0.94  CALCIUM 8.9  GLUCOSE 133*    Recent Labs  11/30/14 0542 11/30/14 0722  TROPONINI 0.89* 1.25*    Radiology/Studies:  Dg Chest Port 1 View  11/30/2014   CLINICAL DATA:  Central chest pain radiating the left arm  EXAM: PORTABLE CHEST - 1 VIEW  COMPARISON:  11/09/2014  FINDINGS: There is moderate cardiomegaly, unchanged. The lungs are clear. There are no large effusions.  IMPRESSION: Cardiomegaly.   Electronically Signed   By: Ellery Plunk  M.D.   On: 11/30/2014 05:45   Dg Chest Portable 1 View  11/09/2014   CLINICAL DATA:  Central chest pain and intermittent dyspnea, worsening in severity over the last week  EXAM: PORTABLE CHEST - 1 VIEW  COMPARISON:  09/11/2013  FINDINGS: There is unchanged mild cardiomegaly. Hilar and mediastinal contours are unchanged. The lungs are clear. There are no effusions. Pulmonary vasculature is normal.  IMPRESSION: Unchanged cardiomegaly.  No acute findings   Electronically Signed   By: Ellery Plunk M.D.   On: 11/09/2014 19:20     EKG: NSR, inferolateral TWI (III, aVF, V1-V6), incomplete RBBB.   CARDIAC STUDIES:  Cardiac Catheterization note pending  ASSESSMENT AND PLAN:  64 y/o F w/ PMHx of HTN and COPD, presented to ED w/ chest pain, admitted for NSTEMI.   NSTEMI: Patient w/ intermittent chest pain that became worse and persistent this AM. Not relieved by anything she tried at home. Pain radiated into left arm, accompanied by SOB, diaphoresis. Initial troponin 0.89 in [facility] ED. EKG w/ mostly inferolateral TWI and incomplete RBBB. Patient taken to cath lab, received PCI to LCx, planned for  further intervention to LAD later in the week. Tolerated procedure well, no pain, SOB, or other complaints at this time.  -Admit to CCU -Continue ASA + Brilinta 90 mg bid -Start Lopressor 12.5 mg bid this evening -Start Lipitor 80 mg qhs -Advance diet as tolerated -Continue to cycle cardiac enzymes -Lipid panel, HbA1c, CBC, BMP in AM -NTG, Zofran prn   Signed, Lars MassonJones, [place] MD PGY-2, Internal Medicine Pager: [phone]    Patient seen and examined. Agree with assessment and plan.  Ms. Dan HumphreysWalker is a 64 year old female with a history of obesity, mild hypertension, who has developed a proximally 3-4 weeks of recurrent episodes of chest pain.  She developed significant chest pain this morning at approximately 2 AM during sleep.  She ultimately [facility] room.  Her ECG showed inferior  T-wave abnormalities.  Initial troponins are mildly positive consistent with a non-ST segment elevation MI.  [facility] for urgent cardiac catheterization in light of ongoing chest pain.   Lennette Biharihomas A. Atiyana Welte, MD, [facility] 11/30/2014 5:47 PM

## 2014-12-01 DIAGNOSIS — Z720 Tobacco use: Secondary | ICD-10-CM

## 2014-12-01 DIAGNOSIS — I10 Essential (primary) hypertension: Secondary | ICD-10-CM

## 2014-12-01 LAB — LIPID PANEL
Cholesterol: 163 mg/dL (ref 0–200)
HDL: 31 mg/dL — ABNORMAL LOW (ref >39)
LDL Cholesterol: 95 mg/dL (ref 0–99)
Total CHOL/HDL Ratio: 5.3 RATIO
Triglycerides: 184 mg/dL — ABNORMAL HIGH (ref <150)
VLDL: 37 mg/dL (ref 0–40)

## 2014-12-01 LAB — CBC
HCT: 37.9 % (ref 36.0–46.0)
Hemoglobin: 12.2 g/dL (ref 12.0–15.0)
MCH: 28.7 pg (ref 26.0–34.0)
MCHC: 32.2 g/dL (ref 30.0–36.0)
MCV: 89.2 fL (ref 78.0–100.0)
PLATELETS: 292 10*3/uL (ref 150–400)
RBC: 4.25 MIL/uL (ref 3.87–5.11)
RDW: 13.4 % (ref 11.5–15.5)
WBC: 9 10*3/uL (ref 4.0–10.5)

## 2014-12-01 LAB — BASIC METABOLIC PANEL
Anion gap: 6 (ref 5–15)
BUN: 8 mg/dL (ref 6–23)
CALCIUM: 8.5 mg/dL (ref 8.4–10.5)
CO2: 26 mmol/L (ref 19–32)
CREATININE: 0.83 mg/dL (ref 0.50–1.10)
Chloride: 107 mmol/L (ref 96–112)
GFR calc Af Amer: 84 mL/min — ABNORMAL LOW (ref >90)
GFR calc non Af Amer: 72 mL/min — ABNORMAL LOW (ref >90)
Glucose, Bld: 102 mg/dL — ABNORMAL HIGH (ref 70–99)
Potassium: 4.2 mmol/L (ref 3.5–5.1)
Sodium: 139 mmol/L (ref 135–145)

## 2014-12-01 MED ORDER — NICOTINE 14 MG/24HR TD PT24
14.0000 mg | MEDICATED_PATCH | Freq: Every day | TRANSDERMAL | Status: DC
  Administered 2014-12-01 – 2014-12-05 (×5): 14 mg via TRANSDERMAL
  Filled 2014-12-01 (×5): qty 1

## 2014-12-01 MED ORDER — DOCUSATE SODIUM 100 MG PO CAPS
100.0000 mg | ORAL_CAPSULE | Freq: Two times a day (BID) | ORAL | Status: DC
  Administered 2014-12-01 – 2014-12-04 (×7): 100 mg via ORAL
  Filled 2014-12-01 (×9): qty 1

## 2014-12-01 MED ORDER — METOPROLOL TARTRATE 25 MG PO TABS
25.0000 mg | ORAL_TABLET | Freq: Two times a day (BID) | ORAL | Status: DC
  Administered 2014-12-01 – 2014-12-05 (×9): 25 mg via ORAL
  Filled 2014-12-01 (×8): qty 1

## 2014-12-01 MED FILL — Sodium Chloride IV Soln 0.9%: INTRAVENOUS | Qty: 50 | Status: AC

## 2014-12-01 NOTE — Progress Notes (Signed)
CARDIAC REHAB PHASE I   Pt in bed, looks uncomfortable. When asked she sts she is having mild left chest discomfort/tightness that comes and goes. Sts it improves with a deep breath. I planned to get her to recliner but she is getting ready to transfer per RN. There is not a recliner in her room so RN will get her to recliner in next room. Began ed with pt and sister. Voiced understanding but will need reiteration. Pt seems distracted, not sure if by how she feels or her family members. Will f/u tomorrow. [phone]  Monica LovettReeve, Monica Thompson KinbraeKristan CES, ACSM 12/01/2014 12:03 PM

## 2014-12-01 NOTE — Progress Notes (Signed)
Progress Note  Subjective:    Having some mild chest tightness this AM. Otherwise no complaints.   Objective:   Temp:  [97.6 F (36.4 C)-98.6 F (37 C)] 98.6 F (37 C) (02/19 0300) Pulse Rate:  [65-90] 73 (02/19 0600) Resp:  [11-28] 16 (02/19 0600) BP: (92-131)/(49-100) 113/73 mmHg (02/19 0600) SpO2:  [92 %-100 %] 96 % (02/19 0600) Last BM Date: 11/29/14  Filed Weights   11/30/14 0527  Weight: 286 lb (129.729 kg)    Intake/Output Summary (Last 24 hours) at 12/01/14 0701 Last data filed at 12/01/14 0600  Gross per 24 hour  Intake 2617.5 ml  Output   2325 ml  Net  292.5 ml    Telemetry: NSR   Physical Exam: General: Obese white female, alert, cooperative, NAD. HEENT: PERRL, EOMI. Moist mucus membranes Neck: Full range of motion without pain, supple, no lymphadenopathy or carotid bruits Lungs: Clear to ascultation bilaterally, normal work of respiration, no wheezes, rales, rhonchi Heart: RRR, no murmurs, gallops, or rubs Abdomen: Soft, non-tender, non-distended, BS + Extremities: No cyanosis, clubbing. Trace pitting edema.  Neurologic: Alert & oriented x3, cranial nerves II-XII intact, strength grossly intact, sensation intact to light touch   Lab Results:  Basic Metabolic Panel:  Recent Labs Lab 11/30/14 0542  NA 136  K 4.3  CL 101  CO2 26  GLUCOSE 133*  BUN 17  CREATININE 0.94  CALCIUM 8.9    CBC:  Recent Labs Lab 11/30/14 0542  WBC 11.2*  HGB 13.4  HCT 40.3  MCV 89.4  PLT 339    Cardiac Enzymes:  Recent Labs Lab 11/30/14 0542 11/30/14 0722 11/30/14 1336  CKTOTAL  --   --  543*  CKMB  --   --  54.8*  TROPONINI 0.89* 1.25* 13.67*    Coagulation:  Recent Labs Lab 11/30/14 0910  INR 1.12    Radiology: Dg Chest Port 1 View  11/30/2014   CLINICAL DATA:  Central chest pain radiating the left arm  EXAM: PORTABLE CHEST - 1 VIEW  COMPARISON:  11/09/2014  FINDINGS: There is moderate cardiomegaly, unchanged. The lungs are clear.  There are no large effusions.  IMPRESSION: Cardiomegaly.   Electronically Signed   By: Ellery Plunk M.D.   On: 11/30/2014 05:45     ECG: NSR w/ incomplete RBBB. Inferolateral TWI   Medications:   Scheduled Medications: . antiseptic oral rinse  7 mL Mouth Rinse BID  . aspirin EC  81 mg Oral Daily  . atorvastatin  80 mg Oral q1800  . metoprolol tartrate  12.5 mg Oral BID  . nitroGLYCERIN  1 inch Topical Once  . ticagrelor  90 mg Oral BID     Infusions: . sodium chloride 100 mL/hr at 12/01/14 0000     PRN Medications:  acetaminophen, nitroGLYCERIN, ondansetron (ZOFRAN) IV   Assessment and Plan:  64 y/o F w/ PMHx of HTN and COPD, presented to ED w/ chest pain, admitted for NSTEMI.   NSTEMI: No further chest pain this AM. Initial troponin 0.89 in [facility] ED. EKG w/ mostly inferolateral TWI and incomplete RBBB. Patient taken to cath lab, received PCI to LCx, planned for further intervention to LAD later in the week.  -Continue ASA + Brilinta 90 mg bid -Increase Lopressor to 25 mg bid -Continue Lipitor 80 mg qhs -Lipid panel, HbA1c, CBC, BMP pending -NTG, Zofran prn  HTN: Stable currently.  -Metoprolol as above   Lauris Chroman, MD PGY-2 Internal Medicine Pager: [phone]  I  have examined the patient and reviewed assessment and plan and discussed with patient.  Agree with above as stated.  Radial site intact. Planning LAD PCI on Monday.  Encouraged smoking cessation.  She has had success with the patch in the past.  Make sure to d/c on nicotine patch.  Avagail Whittlesey S.

## 2014-12-02 DIAGNOSIS — E785 Hyperlipidemia, unspecified: Secondary | ICD-10-CM

## 2014-12-02 DIAGNOSIS — R7309 Other abnormal glucose: Secondary | ICD-10-CM

## 2014-12-02 DIAGNOSIS — I255 Ischemic cardiomyopathy: Secondary | ICD-10-CM

## 2014-12-02 LAB — CBC
HCT: 36.2 % (ref 36.0–46.0)
Hemoglobin: 11.8 g/dL — ABNORMAL LOW (ref 12.0–15.0)
MCH: 29.1 pg (ref 26.0–34.0)
MCHC: 32.6 g/dL (ref 30.0–36.0)
MCV: 89.2 fL (ref 78.0–100.0)
PLATELETS: 276 10*3/uL (ref 150–400)
RBC: 4.06 MIL/uL (ref 3.87–5.11)
RDW: 13.3 % (ref 11.5–15.5)
WBC: 8.5 10*3/uL (ref 4.0–10.5)

## 2014-12-02 LAB — HEMOGLOBIN A1C
HEMOGLOBIN A1C: 6.3 % — AB (ref 4.8–5.6)
Mean Plasma Glucose: 134 mg/dL

## 2014-12-02 MED ORDER — ALUM & MAG HYDROXIDE-SIMETH 200-200-20 MG/5ML PO SUSP
30.0000 mL | ORAL | Status: DC | PRN
  Administered 2014-12-02: 30 mL via ORAL
  Filled 2014-12-02: qty 30

## 2014-12-02 NOTE — Progress Notes (Signed)
SUBJECTIVE: Pt feeling "much better". Denies chest pain and says breathing has improved. Was supposed to have echocardiogram done as outpatient before CAD had been formally diagnosed.     Intake/Output Summary (Last 24 hours) at 12/02/14 1314 Last data filed at 12/02/14 0850  Gross per 24 hour  Intake    960 ml  Output   1800 ml  Net   -840 ml    Current Facility-Administered Medications  Medication Dose Route Frequency Provider Last Rate Last Dose  . 0.9 %  sodium chloride infusion   Intravenous Continuous Lennette Bihari, MD 100 mL/hr at 12/01/14 0700    . acetaminophen (TYLENOL) tablet 650 mg  650 mg Oral Q4H PRN Lennette Bihari, MD      . antiseptic oral rinse (CPC / CETYLPYRIDINIUM CHLORIDE 0.05%) solution 7 mL  7 mL Mouth Rinse BID Lennette Bihari, MD   7 mL at 12/02/14 0945  . aspirin EC tablet 81 mg  81 mg Oral Daily Lennette Bihari, MD   81 mg at 12/02/14 0940  . atorvastatin (LIPITOR) tablet 80 mg  80 mg Oral q1800 Courtney Paris, MD   80 mg at 12/01/14 1744  . docusate sodium (COLACE) capsule 100 mg  100 mg Oral BID Haydee Salter, MD   100 mg at 12/02/14 1610  . metoprolol tartrate (LOPRESSOR) tablet 25 mg  25 mg Oral BID Corky Crafts, MD   25 mg at 12/02/14 0940  . nicotine (NICODERM CQ - dosed in mg/24 hours) patch 14 mg  14 mg Transdermal Daily Courtney Paris, MD   14 mg at 12/02/14 0950  . nitroGLYCERIN (NITROGLYN) 2 % ointment 1 inch  1 inch Topical Once Ward Givens, MD   1 inch at 11/30/14 9604  . nitroGLYCERIN (NITROSTAT) SL tablet 0.4 mg  0.4 mg Sublingual Q5 min PRN Ward Givens, MD   0.4 mg at 11/30/14 0552  . ondansetron (ZOFRAN) injection 4 mg  4 mg Intravenous Q6H PRN Lennette Bihari, MD      . ticagrelor [facility]) tablet 90 mg  90 mg Oral BID Lennette Bihari, MD   90 mg at 12/02/14 0939    Filed Vitals:   12/01/14 2055 12/02/14 0422 12/02/14 0929 12/02/14 0938  BP: 113/54 115/55 114/68 114/68  Pulse: 73 71 68 68  Temp: 98.2 F (36.8 C) 98.3 F (36.8 C)     TempSrc: Oral Oral    Resp: 18 18    Height:      Weight:  287 lb 14.7 oz (130.6 kg)    SpO2: 97% 97%      PHYSICAL EXAM General: NAD HEENT: Normal. Neck: No JVD, no thyromegaly.  Lungs: Diminished but cear to auscultation with no rales or wheezes, faint dry crackles at bases. CV: Nondisplaced PMI.  Regular rate and rhythm, normal S1/S2, no S3/S4, no murmur.  No pretibial edema.  Abdomen: Soft, obese, no distention.  Neurologic: Alert and oriented x 3.  Psych: Normal affect. Musculoskeletal: Normal range of motion. No gross deformities. Extremities: No clubbing or cyanosis.   TELEMETRY: Reviewed telemetry pt in sinus rhythm.  LABS: Basic Metabolic Panel:  Recent Labs  54/09/81 0542 12/01/14 0840  NA 136 139  K 4.3 4.2  CL 101 107  CO2 26 26  GLUCOSE 133* 102*  BUN 17 8  CREATININE 0.94 0.83  CALCIUM 8.9 8.5   Liver Function Tests: No results for input(s): AST, ALT,  ALKPHOS, BILITOT, PROT, ALBUMIN in the last 72 hours. No results for input(s): LIPASE, AMYLASE in the last 72 hours. CBC:  Recent Labs  12/01/14 0840 12/02/14 0328  WBC 9.0 8.5  HGB 12.2 11.8*  HCT 37.9 36.2  MCV 89.2 89.2  PLT 292 276   Cardiac Enzymes:  Recent Labs  11/30/14 0542 11/30/14 0722 11/30/14 1336  CKTOTAL  --   --  543*  CKMB  --   --  54.8*  TROPONINI 0.89* 1.25* 13.67*   BNP: Invalid input(s): POCBNP D-Dimer: No results for input(s): DDIMER in the last 72 hours. Hemoglobin A1C:  Recent Labs  12/01/14 0840  HGBA1C 6.3*   Fasting Lipid Panel:  Recent Labs  12/01/14 0840  CHOL 163  HDL 31*  LDLCALC 95  TRIG 161184*  CHOLHDL 5.3   Thyroid Function Tests: No results for input(s): TSH, T4TOTAL, T3FREE, THYROIDAB in the last 72 hours.  Invalid input(s): FREET3 Anemia Panel: No results for input(s): VITAMINB12, FOLATE, FERRITIN, TIBC, IRON, RETICCTPCT in the last 72 hours.  RADIOLOGY: Dg Chest Port 1 View  11/30/2014   CLINICAL DATA:  Central chest pain  radiating the left arm  EXAM: PORTABLE CHEST - 1 VIEW  COMPARISON:  11/09/2014  FINDINGS: There is moderate cardiomegaly, unchanged. The lungs are clear. There are no large effusions.  IMPRESSION: Cardiomegaly.   Electronically Signed   By: Ellery Plunkaniel R Mitchell M.D.   On: 11/30/2014 05:45   Dg Chest Portable 1 View  11/09/2014   CLINICAL DATA:  Central chest pain and intermittent dyspnea, worsening in severity over the last week  EXAM: PORTABLE CHEST - 1 VIEW  COMPARISON:  09/11/2013  FINDINGS: There is unchanged mild cardiomegaly. Hilar and mediastinal contours are unchanged. The lungs are clear. There are no effusions. Pulmonary vasculature is normal.  IMPRESSION: Unchanged cardiomegaly.  No acute findings   Electronically Signed   By: Ellery Plunkaniel R Mitchell M.D.   On: 11/09/2014 19:20      ASSESSMENT AND PLAN: 1. NSTEMI s/p PCI of LCx with plans for PCI of LAD (80% stenosis) on 2/22: Symptomatically stable. Continue ASA, Lipitor, metoprolol, and Brilinta. Continue DAPT x 1 year or longer based on Pegasus trial data. Will obtain echocardiogram for accurate assessment of LV systolic function and regional wall motion. LV gram EF 50% with wall motion abnormalities. No ACEI due to low normal BP.  2. Essential HTN: Well controlled. No changes.  3. Dyslipidemia: Mildly elevated TG. Now on high intensity statin therapy.  4. Elevated HbA1C, 6.3%: Indicative of pre-diabetes. Will need aggressive risk factor modification including weight loss.   Prentice DockerSuresh Koneswaran, M.D., F.A.C.C.

## 2014-12-02 NOTE — Plan of Care (Signed)
Problem: Phase II Progression Outcomes Goal: Ambulates up to 600 ft. in hall x 1 Outcome: Progressing Ambulated 18450ft

## 2014-12-02 NOTE — Plan of Care (Signed)
Problem: Phase II Progression Outcomes Goal: Vascular site scale level 0 - I Vascular Site Scale Level 0: No bruising/bleeding/hematoma Level I (Mild): Bruising/Ecchymosis, minimal bleeding/ooozing, palpable hematoma < 3 cm Level II (Moderate): Bleeding not affecting hemodynamic parameters, pseudoaneurysm, palpable hematoma > 3 cm Level III (Severe) Bleeding which affects hemodynamic parameters or retroperitoneal hemorrhage  Outcome: Completed/Met Date Met:  12/02/14 Right radial level (0)

## 2014-12-02 NOTE — Progress Notes (Signed)
CARDIAC REHAB PHASE I   PRE:  Rate/Rhythm: 80 SR    BP: sitting 132/65    SaO2: 98 RA  MODE:  Ambulation: 150 ft   POST:  Rate/Rhythm: 83 SR    BP: sitting 139/63     SaO2: 95 RA  Pt has been up very little. Apparently wasn't doing much at home before admit either. Able to walk assist x1 however unsteady and limping at times. Daughter in law sts she has a bad ankle and pt claims socks are making her unsteady. Rest x1 due to SOB and fatigue. Pt sts she has burning in her "broncioles" with ambulating and using IS. Will f/u Monday. Discussed thinking about smoking cessation. Pt voices that it makes her mad when people tell her to quit. Encouraged her to make the decision for herself and to think about a plan. Gave her fake cigarette since she was wanting something in her hand. [id number]   Elissa LovettReeve, Macey Wurtz FriendshipKristan CES, ACSM 12/02/2014 2:24 PM

## 2014-12-03 ENCOUNTER — Encounter (HOSPITAL_COMMUNITY): Payer: Self-pay | Admitting: Nurse Practitioner

## 2014-12-03 DIAGNOSIS — Z716 Tobacco abuse counseling: Secondary | ICD-10-CM

## 2014-12-03 DIAGNOSIS — I10 Essential (primary) hypertension: Secondary | ICD-10-CM | POA: Diagnosis present

## 2014-12-03 DIAGNOSIS — Z720 Tobacco use: Secondary | ICD-10-CM | POA: Diagnosis present

## 2014-12-03 DIAGNOSIS — J438 Other emphysema: Secondary | ICD-10-CM

## 2014-12-03 DIAGNOSIS — I25110 Atherosclerotic heart disease of native coronary artery with unstable angina pectoris: Secondary | ICD-10-CM

## 2014-12-03 DIAGNOSIS — J449 Chronic obstructive pulmonary disease, unspecified: Secondary | ICD-10-CM | POA: Diagnosis present

## 2014-12-03 DIAGNOSIS — E785 Hyperlipidemia, unspecified: Secondary | ICD-10-CM | POA: Diagnosis present

## 2014-12-03 DIAGNOSIS — I2510 Atherosclerotic heart disease of native coronary artery without angina pectoris: Secondary | ICD-10-CM

## 2014-12-03 LAB — CBC
HCT: 36.7 % (ref 36.0–46.0)
Hemoglobin: 12 g/dL (ref 12.0–15.0)
MCH: 28.8 pg (ref 26.0–34.0)
MCHC: 32.7 g/dL (ref 30.0–36.0)
MCV: 88.2 fL (ref 78.0–100.0)
PLATELETS: 295 10*3/uL (ref 150–400)
RBC: 4.16 MIL/uL (ref 3.87–5.11)
RDW: 13.3 % (ref 11.5–15.5)
WBC: 8.8 10*3/uL (ref 4.0–10.5)

## 2014-12-03 MED ORDER — SODIUM CHLORIDE 0.9 % IJ SOLN
3.0000 mL | Freq: Two times a day (BID) | INTRAMUSCULAR | Status: DC
Start: 2014-12-03 — End: 2014-12-04
  Administered 2014-12-03: 3 mL via INTRAVENOUS

## 2014-12-03 MED ORDER — SODIUM CHLORIDE 0.9 % IV SOLN: 250.0000 mL | INTRAVENOUS | Status: DC | PRN

## 2014-12-03 MED ORDER — SODIUM CHLORIDE 0.9 % IJ SOLN: 3.0000 mL | INTRAMUSCULAR | Status: DC | PRN

## 2014-12-03 MED ORDER — SODIUM CHLORIDE 0.9 % IV SOLN
1.0000 mL/kg/h | INTRAVENOUS | Status: DC
  Administered 2014-12-04: 1 mL/kg/h via INTRAVENOUS

## 2014-12-03 NOTE — Progress Notes (Signed)
  Echocardiogram 2D Echocardiogram has been performed.  Leta JunglingCooper, Danne Scardina M 12/03/2014, 4:52 PM

## 2014-12-03 NOTE — Progress Notes (Signed)
Patient Name: Monica Thompson Date of Encounter: 12/03/2014   Principal Problem:   NSTEMI, initial episode of care Active Problems:   CAD (coronary artery disease)   Hypertension   Tobacco abuse   COPD (chronic obstructive pulmonary disease)   Hyperlipidemia    SUBJECTIVE  No chest pain or sob overnight.  CURRENT MEDS . antiseptic oral rinse  7 mL Mouth Rinse BID  . aspirin EC  81 mg Oral Daily  . atorvastatin  80 mg Oral q1800  . docusate sodium  100 mg Oral BID  . metoprolol tartrate  25 mg Oral BID  . nicotine  14 mg Transdermal Daily  . nitroGLYCERIN  1 inch Topical Once  . ticagrelor  90 mg Oral BID    OBJECTIVE  Filed Vitals:   12/02/14 0938 12/02/14 1445 12/02/14 2100 12/03/14 0500  BP: 114/68 111/75 114/65 122/64  Pulse: 68 61 61 59  Temp:  98.2 F (36.8 C) 98.4 F (36.9 C) 98.3 F (36.8 C)  TempSrc:  Oral    Resp:  18 16 18   Height:      Weight:    282 lb 9.6 oz (128.187 kg)  SpO2:  98% 95% 98%    Intake/Output Summary (Last 24 hours) at 12/03/14 0737 Last data filed at 12/03/14 0600  Gross per 24 hour  Intake    480 ml  Output   2250 ml  Net  -1770 ml   Filed Weights   12/01/14 1247 12/02/14 0422 12/03/14 0500  Weight: 285 lb 14.4 oz (129.683 kg) 287 lb 14.7 oz (130.6 kg) 282 lb 9.6 oz (128.187 kg)    PHYSICAL EXAM  General: Pleasant, NAD. Neuro: Alert and oriented X 3. Moves all extremities spontaneously. Psych: Normal affect. HEENT:  Normal  Neck: Supple without bruits or JVD. Lungs:  Resp regular and unlabored, CTA. Heart: RRR no s3, s4, or murmurs. Abdomen: Soft, non-tender, non-distended, BS + x 4.  Extremities: No clubbing, cyanosis or edema. DP/PT/Radials 2+ and equal bilaterally.  R wrist cath site w/o bleeding/bruit/hematoma.  Accessory Clinical Findings  CBC  Recent Labs  12/02/14 0328 12/03/14 0320  WBC 8.5 8.8  HGB 11.8* 12.0  HCT 36.2 36.7  MCV 89.2 88.2  PLT 276 295   Basic Metabolic Panel  Recent Labs  12/01/14 0840  NA 139  K 4.2  CL 107  CO2 26  GLUCOSE 102*  BUN 8  CREATININE 0.83  CALCIUM 8.5   Cardiac Enzymes  Recent Labs  11/30/14 1336  CKTOTAL 543*  CKMB 54.8*  TROPONINI 13.67*   Hemoglobin A1C  Recent Labs  12/01/14 0840  HGBA1C 6.3*   Fasting Lipid Panel  Recent Labs  12/01/14 0840  CHOL 163  HDL 31*  LDLCALC 95  TRIG [id number]*  CHOLHDL 5.3   TELE  rsr  Radiology/Studies  Dg Chest Port 1 View  11/30/2014   CLINICAL DATA:  Central chest pain radiating the left arm  EXAM: PORTABLE CHEST - 1 VIEW  COMPARISON:  11/09/2014  FINDINGS: There is moderate cardiomegaly, unchanged. The lungs are clear. There are no large effusions.  IMPRESSION: Cardiomegaly.   Electronically Signed   By: Ellery Plunkaniel R Mitchell M.D.   On: 11/30/2014 05:45   Dg Chest Portable 1 View  11/09/2014   CLINICAL DATA:  Central chest pain and intermittent dyspnea, worsening in severity over the last week  EXAM: PORTABLE CHEST - 1 VIEW  COMPARISON:  09/11/2013  FINDINGS: There is unchanged mild cardiomegaly. Hilar and  mediastinal contours are unchanged. The lungs are clear. There are no effusions. Pulmonary vasculature is normal.  IMPRESSION: Unchanged cardiomegaly.  No acute findings   Electronically Signed   By: Ellery Plunk M.D.   On: 11/09/2014 19:20    ASSESSMENT AND PLAN  1.  NSTEMI/CAD:  S/p PCI/DES to the LCX on 2/18.  Residual LAD dzs pending PCI tomorrow.  Cont asa, statin, bb, brilinta.  2.  HTN:  Stable.  3.  HL:  LDL 95.  Cont high potency statin.  4.  Tob Abuse:  Cessation advised.  She is not sure how committed she is to quitting.  She is currently using a nicotine patch and would like to continue to do so @ d/c.  Signed, Nicolasa Ducking NP   The patient was seen and examined, and I agree with the assessment and plan as documented above. Pt has had intermittent "heartburn and indigestion", which has become more frequent in the last month. Has not been bothersome  overnight. Physical exam stable since 2/20.  1. NSTEMI s/p PCI of LCx with plans for PCI of LAD (80% stenosis) on 2/22: Symptomatically stable. Continue ASA, Lipitor, metoprolol, and Brilinta. Continue DAPT x 1 year or longer based on Pegasus trial data. Awaiting echocardiogram ordered on 2/20 for accurate assessment of LV systolic function and regional wall motion. LV gram EF 50% with wall motion abnormalities. No ACEI due to low normal BP.  2. Essential HTN: Well controlled. No changes.  3. Dyslipidemia: Mildly elevated TG. Now on high intensity statin therapy.  4. Elevated HbA1C, 6.3%: Indicative of pre-diabetes. Will need aggressive risk factor modification including weight loss.

## 2014-12-04 ENCOUNTER — Encounter (HOSPITAL_COMMUNITY): Payer: Self-pay | Admitting: Cardiovascular Disease

## 2014-12-04 ENCOUNTER — Encounter (HOSPITAL_COMMUNITY)
Admission: EM | Disposition: A | Discharge status: Home / Self Care | Payer: Medicare HMO | Source: Home / Self Care | Attending: Cardiovascular Disease

## 2014-12-04 ENCOUNTER — Ambulatory Visit (HOSPITAL_COMMUNITY): Admit: 2014-12-04 | Payer: Self-pay | Admitting: Cardiovascular Disease

## 2014-12-04 HISTORY — PX: PERCUTANEOUS CORONARY STENT INTERVENTION (PCI-S): SHX5485

## 2014-12-04 LAB — CBC
HEMATOCRIT: 36.2 % (ref 36.0–46.0)
Hemoglobin: 12 g/dL (ref 12.0–15.0)
MCH: 28.9 pg (ref 26.0–34.0)
MCHC: 33.1 g/dL (ref 30.0–36.0)
MCV: 87.2 fL (ref 78.0–100.0)
Platelets: 309 10*3/uL (ref 150–400)
RBC: 4.15 MIL/uL (ref 3.87–5.11)
RDW: 13.3 % (ref 11.5–15.5)
WBC: 9.5 10*3/uL (ref 4.0–10.5)

## 2014-12-04 LAB — GLUCOSE, CAPILLARY
GLUCOSE-CAPILLARY: 99 mg/dL (ref 70–99)
GLUCOSE-CAPILLARY: 101 mg/dL — AB (ref 70–99)
Glucose-Capillary: 113 mg/dL — ABNORMAL HIGH (ref 70–99)
Glucose-Capillary: 142 mg/dL — ABNORMAL HIGH (ref 70–99)

## 2014-12-04 LAB — POCT ACTIVATED CLOTTING TIME: Activated Clotting Time: 755 seconds

## 2014-12-04 SURGERY — PERCUTANEOUS CORONARY STENT INTERVENTION (PCI-S)

## 2014-12-04 MED ORDER — BIVALIRUDIN 250 MG IV SOLR
INTRAVENOUS | Status: AC
  Filled 2014-12-04: qty 250

## 2014-12-04 MED ORDER — HEPARIN (PORCINE) IN NACL 2-0.9 UNIT/ML-% IJ SOLN
INTRAMUSCULAR | Status: AC
  Filled 2014-12-04: qty 1000

## 2014-12-04 MED ORDER — SODIUM CHLORIDE 0.9 % IV SOLN
INTRAVENOUS | Status: DC
  Administered 2014-12-04: 17:00:00 via INTRAVENOUS

## 2014-12-04 MED ORDER — ONDANSETRON HCL 4 MG/2ML IJ SOLN: 4.0000 mg | Freq: Four times a day (QID) | INTRAMUSCULAR | Status: DC | PRN

## 2014-12-04 MED ORDER — FENTANYL CITRATE 0.05 MG/ML IJ SOLN
INTRAMUSCULAR | Status: AC
  Filled 2014-12-04: qty 2

## 2014-12-04 MED ORDER — PANTOPRAZOLE SODIUM 40 MG PO TBEC
40.0000 mg | DELAYED_RELEASE_TABLET | Freq: Every day | ORAL | Status: DC
Start: 2014-12-04 — End: 2014-12-05
  Administered 2014-12-04 – 2014-12-05 (×2): 40 mg via ORAL
  Filled 2014-12-04 (×2): qty 1

## 2014-12-04 MED ORDER — TICAGRELOR 90 MG PO TABS
90.0000 mg | ORAL_TABLET | Freq: Two times a day (BID) | ORAL | Status: DC
  Filled 2014-12-04 (×4): qty 1

## 2014-12-04 MED ORDER — NITROGLYCERIN 1 MG/10 ML FOR IR/CATH LAB
INTRA_ARTERIAL | Status: AC
  Filled 2014-12-04: qty 10

## 2014-12-04 MED ORDER — VERAPAMIL HCL 2.5 MG/ML IV SOLN
INTRAVENOUS | Status: AC
Start: 2014-12-04 — End: 2014-12-04
  Filled 2014-12-04: qty 2

## 2014-12-04 MED ORDER — ACETAMINOPHEN 325 MG PO TABS: 650.0000 mg | ORAL_TABLET | ORAL | Status: DC | PRN

## 2014-12-04 MED ORDER — ASPIRIN EC 81 MG PO TBEC
81.0000 mg | DELAYED_RELEASE_TABLET | Freq: Every day | ORAL | Status: DC
  Administered 2014-12-05: 10:00:00 81 mg via ORAL

## 2014-12-04 MED ORDER — MIDAZOLAM HCL 2 MG/2ML IJ SOLN
INTRAMUSCULAR | Status: AC
  Filled 2014-12-04: qty 2

## 2014-12-04 MED ORDER — LIDOCAINE HCL (PF) 1 % IJ SOLN
INTRAMUSCULAR | Status: AC
Start: 2014-12-04 — End: 2014-12-04
  Filled 2014-12-04: qty 30

## 2014-12-04 MED ORDER — HEPARIN SODIUM (PORCINE) 1000 UNIT/ML IJ SOLN
INTRAMUSCULAR | Status: AC
  Filled 2014-12-04: qty 1

## 2014-12-04 NOTE — Progress Notes (Signed)
Pt arrived after cath lab procedure.  Pt alert and oriented. . Denies any discomfort at this time. Report called to [place] RN R

## 2014-12-04 NOTE — CV Procedure (Signed)
Monica Thompson is a 64 y.o. female   [id number]  [id number] LOCATION:  FACILITY: [facility]  PHYSICIAN: Troy Sine, MD, [facility] 16-Apr-1951   DATE OF PROCEDURE:  12/04/2014     PERCUTANEOUS CORONARY INTERVENTION    HISTORY:   Monica Thompson is a 64 year old female who has a history of hypertension, COPD, who underwent emergent percutaneous coronary intervention to a subtotally occluded left circumflex coronary artery on 11/30/2014.  Due to high-grade concomitant LAD disease she is now brought back to the laboratory for staged PCI to her LAD vessel.  The patient has experienced recurrent chest pain subsequent to her initial culprit lesion intervention in the CCU setting.   PROCEDURE:  Pretendinous coronary intervention of the LAD with Angiosculpt's scoring balloon/DES stenting  The patient was brought to the second floor [facility] cath lab in the FASTING state. The patient was premedicated with Versed 2 mg and fentanyl 50 mcg. A right radial approach was utilized after an Allen's test verified adequate circulation. The right radial artery was punctured via the Seldinger technique, and a 6 [place] Glidesheath Slender was inserted without difficulty.  A radial cocktail consisting of Verapamil, IV nitroglycerin, and lidocaine was administered. A Versicore wire was advanced into the ascending aorta. A ^F XB LAD 3.5 guide was used for the intervention.  Angiography confirmed and widely patent left circumflex stent reconfirmed.  The diffuse 80% proximal LAD stenosis immediately after the first septal perforating artery and extending to the takeoff of the first diagonal branch of the LAD.  Angiomax bolus plus infusion was administered.  The patient had already received her morning dose of Brilinta.  The [place] wire was advanced down the LAD.  Since the stenosis extended to the takeoff of the diagonal vessel, initial pre-dilatation was done with an Angiosculpt scoring balloon 3.010 mm to reduce  potential for plaque shifting into the diagonal vessel.  3.518 mm resolute DES stent was then inserted and deployed at 14 atm for 2 inflations.  An [place] use for a 4.012 mm balloon was used for post stent dilatation with stent taper from 3.90-3.78 proximally to distally.  When the procedure.  The patient received several doses of intracoronary nitroglycerin since there was an area of high mild LAD spasm beyond the diagonal vessel.  GI reconfirmed excellent result.  A TR radial band was applied for hemostasis. The patient left the catheterization laboratory in stable condition.   HEMODYNAMICS:   Central Aorta: 97/53  ANGIOGRAPHY:   The left main was angiographically normal and bifurcated into the LAD and circumflex vessel.  The LAD had 80% diffuse stenosis arising after the takeoff of the first septal perforating artery and extending to the takeoff of the first diagonal vessel. The LAD gave rise to several additional simple perforating arteries.  A smaller diagonal vessel distally.  The left circumflex coronary artery was a dominant vessel and the stent which had been placed several days ago was widely patent, and position immediately between the OM1 and OM 2 vessel.  There was 0 residual stenosis.  There was brisk TIMI-3 flow.  Following PCI to the LAD with initial Angiosculpt scoring balloon, and ultimate insertion of a 3.518 mm Resolute DES stent postdilated with the 4.0 [place] Euphora balloon with stent taper from 3.90-3.78 mm, the entire stenosis was reduced to 0%.  He was an area of 10 to less than 20% spasm in the mid LAD beyond the dissection which improved with IC nitroglycerin.    Total contrast used:  130 cc Omnipaque  IMPRESSION:  Widely patent left circumflex mid AV groove stent from the patient's prior intervention on 11/30/2014 in the setting of an acute coronary syndrome/non-ST segment elevation MI.  Successful  PCI to the diffuse 80% proximal LAD stenosis treated with Angiosculpt  scoring balloon and insertion of a 3.518 mm Resolute DES stent postdilated to 3.90, tapering to 3.78 mm with the entire region been reduced to 0%.   RECOMMENDATION:  Patient will be maintained on dual antiplatelet therapy for minimum of a year and subsequent to this should be treated with prolonged therapy as per the recent Pegasus trial data.  Aggressive lipid-lowering therapy with target LDL less than 70, beta blocker therapy and ACE inhibition will be continued.  Smoking cessation is imperative.   Troy Sine, MD, [facility] 12/04/2014 8:57 AM

## 2014-12-04 NOTE — Care Management Note (Addendum)
    Page 1 of 1   12/05/2014     11:11:04 AM CARE MANAGEMENT NOTE 12/05/2014  Patient:  Monica Thompson,Monica L   Account Number:  [id number]  Date Initiated:  11/30/2014  Documentation initiated by:  Junius CreamerWELL,DEBBIE  Subjective/Objective Assessment:   adm w nstemi     Action/Plan:   lives w fam   Anticipated DC Date:  12/05/2014   Anticipated DC Plan:  HOME/SELF CARE      DC Planning Services  CM consult  Medication Assistance      Choice offered to / List presented to:             Status of service:  Completed, signed off Medicare Important Message given?  YES (If response is "NO", the following Medicare IM given date fields will be blank) Date Medicare IM given:  12/05/2014 Medicare IM given by:  Ilse Billman Date Additional Medicare IM given:   Additional Medicare IM given by:    Discharge Disposition:  HOME/SELF CARE  Per UR Regulation:  Reviewed for med. necessity/level of care/duration of stay  If discussed at Long Length of Stay Meetings, dates discussed:    Comments:  12/05/14 Monica AceJulie Paarth Cropper, RN, BSN [phone] Informed pt and family of copay information, and confirmed that they do have Brilinta booklet-daughter had taken book home.  12/04/14 Monica AceJulie Davi Kroon, RN, BSN [phone]  12/04/2014 Chilton Sialled [employer] at [phone]. Talked with CSR Briana. BRILINTA is covered. No Prior Authorization required. Patient's must meet the prescription deductible for their [employer] Insurance. The deductible is $185.00. The Retail Pharmacy co-payment will be $47.00. Patient's total would be $132.00. [employer]'s preferred retail pharmacy is [facility].       [facility].

## 2014-12-04 NOTE — H&P (View-Only) (Signed)
  Progress Note  Subjective:    Having some mild chest tightness this AM. Otherwise no complaints.   Objective:   Temp:  [97.6 F (36.4 C)-98.6 F (37 C)] 98.6 F (37 C) (02/19 0300) Pulse Rate:  [65-90] 73 (02/19 0600) Resp:  [11-28] 16 (02/19 0600) BP: (92-131)/(49-100) 113/73 mmHg (02/19 0600) SpO2:  [92 %-100 %] 96 % (02/19 0600) Last BM Date: 11/29/14  Filed Weights   11/30/14 0527  Weight: 286 lb (129.729 kg)    Intake/Output Summary (Last 24 hours) at 12/01/14 0701 Last data filed at 12/01/14 0600  Gross per 24 hour  Intake 2617.5 ml  Output   2325 ml  Net  292.5 ml    Telemetry: NSR   Physical Exam: General: Obese white female, alert, cooperative, NAD. HEENT: PERRL, EOMI. Moist mucus membranes Neck: Full range of motion without pain, supple, no lymphadenopathy or carotid bruits Lungs: Clear to ascultation bilaterally, normal work of respiration, no wheezes, rales, rhonchi Heart: RRR, no murmurs, gallops, or rubs Abdomen: Soft, non-tender, non-distended, BS + Extremities: No cyanosis, clubbing. Trace pitting edema.  Neurologic: Alert & oriented x3, cranial nerves II-XII intact, strength grossly intact, sensation intact to light touch   Lab Results:  Basic Metabolic Panel:  Recent Labs Lab 11/30/14 0542  NA 136  K 4.3  CL 101  CO2 26  GLUCOSE 133*  BUN 17  CREATININE 0.94  CALCIUM 8.9    CBC:  Recent Labs Lab 11/30/14 0542  WBC 11.2*  HGB 13.4  HCT 40.3  MCV 89.4  PLT 339    Cardiac Enzymes:  Recent Labs Lab 11/30/14 0542 11/30/14 0722 11/30/14 1336  CKTOTAL  --   --  543*  CKMB  --   --  54.8*  TROPONINI 0.89* 1.25* 13.67*    Coagulation:  Recent Labs Lab 11/30/14 0910  INR 1.12    Radiology: Dg Chest Port 1 View  11/30/2014   CLINICAL DATA:  Central chest pain radiating the left arm  EXAM: PORTABLE CHEST - 1 VIEW  COMPARISON:  11/09/2014  FINDINGS: There is moderate cardiomegaly, unchanged. The lungs are clear.  There are no large effusions.  IMPRESSION: Cardiomegaly.   Electronically Signed   By: Daniel R Mitchell M.D.   On: 11/30/2014 05:45     ECG: NSR w/ incomplete RBBB. Inferolateral TWI   Medications:   Scheduled Medications: . antiseptic oral rinse  7 mL Mouth Rinse BID  . aspirin EC  81 mg Oral Daily  . atorvastatin  80 mg Oral q1800  . metoprolol tartrate  12.5 mg Oral BID  . nitroGLYCERIN  1 inch Topical Once  . ticagrelor  90 mg Oral BID     Infusions: . sodium chloride 100 mL/hr at 12/01/14 0000     PRN Medications:  acetaminophen, nitroGLYCERIN, ondansetron (ZOFRAN) IV   Assessment and Plan:  63 y/o F w/ PMHx of HTN and COPD, presented to ED w/ chest pain, admitted for NSTEMI.   NSTEMI: No further chest pain this AM. Initial troponin 0.89 in [facility] ED. EKG w/ mostly inferolateral TWI and incomplete RBBB. Patient taken to cath lab, received PCI to LCx, planned for further intervention to LAD later in the week.  -Continue ASA + Brilinta 90 mg bid -Increase Lopressor to 25 mg bid -Continue Lipitor 80 mg qhs -Lipid panel, HbA1c, CBC, BMP pending -NTG, Zofran prn  HTN: Stable currently.  -Metoprolol as above   Woody Jones, MD PGY-2 Internal Medicine Pager: [phone]  I   have examined the patient and reviewed assessment and plan and discussed with patient.  Agree with above as stated.  Radial site intact. Planning LAD PCI on Monday.  Encouraged smoking cessation.  She has had success with the patch in the past.  Make sure to d/c on nicotine patch.  Taym Twist S.  

## 2014-12-04 NOTE — Progress Notes (Signed)
TR BAND REMOVAL  LOCATION:    right radial  DEFLATED PER PROTOCOL:    Yes.    TIME BAND OFF / DRESSING APPLIED:    1230   SITE UPON ARRIVAL:    Level 0  SITE AFTER BAND REMOVAL:    Level 1( small bruise , no hematoma)  REVERSE ALLEN'S TEST:     positive  CIRCULATION SENSATION AND MOVEMENT:    Within Normal Limits   Yes.    COMMENTS:   Tolerated procedure well

## 2014-12-04 NOTE — Interval H&P Note (Signed)
Cath Lab Visit (complete for each Cath Lab visit)  Clinical Evaluation Leading to the Procedure:   ACS: Yes.    Non-ACS:    Anginal Classification: CCS III  Anti-ischemic medical therapy: Maximal Therapy (2 or more classes of medications)  Non-Invasive Test Results: No non-invasive testing performed  Prior CABG: No previous CABG      History and Physical Interval Note:  12/04/2014 7:45 AM  Monica NewcomerEvelyn L Thompson  has presented today for surgery, with the diagnosis of cad  The various methods of treatment have been discussed with the patient and family. After consideration of risks, benefits and other options for treatment, the patient has consented to  Procedure(s): PERCUTANEOUS CORONARY STENT INTERVENTION (PCI-S) (N/A) as a surgical intervention .  The patient's history has been reviewed, patient examined, no change in status, stable for surgery.  I have reviewed the patient's chart and labs.  Questions were answered to the patient's satisfaction.     KELLY,THOMAS A

## 2014-12-05 ENCOUNTER — Encounter (HOSPITAL_COMMUNITY): Payer: Self-pay | Admitting: Nurse Practitioner

## 2014-12-05 DIAGNOSIS — Z6841 Body Mass Index (BMI) 40.0 and over, adult: Secondary | ICD-10-CM

## 2014-12-05 DIAGNOSIS — Z9861 Coronary angioplasty status: Secondary | ICD-10-CM

## 2014-12-05 LAB — CBC
HCT: 35.9 % — ABNORMAL LOW (ref 36.0–46.0)
Hemoglobin: 11.9 g/dL — ABNORMAL LOW (ref 12.0–15.0)
MCH: 29.4 pg (ref 26.0–34.0)
MCHC: 33.1 g/dL (ref 30.0–36.0)
MCV: 88.6 fL (ref 78.0–100.0)
PLATELETS: 276 10*3/uL (ref 150–400)
RBC: 4.05 MIL/uL (ref 3.87–5.11)
RDW: 13.3 % (ref 11.5–15.5)
WBC: 7.9 10*3/uL (ref 4.0–10.5)

## 2014-12-05 LAB — BASIC METABOLIC PANEL
Anion gap: 4 — ABNORMAL LOW (ref 5–15)
BUN: 9 mg/dL (ref 6–23)
CHLORIDE: 109 mmol/L (ref 96–112)
CO2: 26 mmol/L (ref 19–32)
Calcium: 8.5 mg/dL (ref 8.4–10.5)
Creatinine, Ser: 0.83 mg/dL (ref 0.50–1.10)
GFR calc Af Amer: 84 mL/min — ABNORMAL LOW (ref >90)
GFR calc non Af Amer: 72 mL/min — ABNORMAL LOW (ref >90)
GLUCOSE: 100 mg/dL — AB (ref 70–99)
POTASSIUM: 4.2 mmol/L (ref 3.5–5.1)
Sodium: 139 mmol/L (ref 135–145)

## 2014-12-05 LAB — GLUCOSE, CAPILLARY: GLUCOSE-CAPILLARY: 96 mg/dL (ref 70–99)

## 2014-12-05 MED ORDER — METOPROLOL TARTRATE 25 MG PO TABS: 25.0000 mg | ORAL_TABLET | Freq: Two times a day (BID) | ORAL | Status: DC

## 2014-12-05 MED ORDER — NITROGLYCERIN 0.4 MG SL SUBL: 0.4000 mg | SUBLINGUAL_TABLET | SUBLINGUAL | Status: DC | PRN

## 2014-12-05 MED ORDER — NICOTINE 14 MG/24HR TD PT24: 14.0000 mg | MEDICATED_PATCH | Freq: Every day | TRANSDERMAL | Status: DC

## 2014-12-05 MED ORDER — TICAGRELOR 90 MG PO TABS: 90.0000 mg | ORAL_TABLET | Freq: Two times a day (BID) | ORAL | Status: DC

## 2014-12-05 MED ORDER — ASPIRIN EC 81 MG PO TBEC: 81.0000 mg | DELAYED_RELEASE_TABLET | Freq: Every day | ORAL | Status: DC

## 2014-12-05 MED ORDER — ATORVASTATIN CALCIUM 80 MG PO TABS: 80.0000 mg | ORAL_TABLET | Freq: Every day | ORAL | Status: DC

## 2014-12-05 NOTE — Progress Notes (Signed)
CARDIAC REHAB PHASE I   PRE:  Rate/Rhythm: 58 SB    BP: sitting 108/50    SaO2:   MODE:  Ambulation: 200 ft   POST:  Rate/Rhythm: 82 SR    BP: sitting 139/74     SaO2:   Pt improved today, feels better, less SOB/uncomfortable. Did not limp. Used RW first half, did not need it second half of walk. In depth discussion of making change. Pt is contemplating quitting smoking.  Long discussion of this. She does not like people to tell her to quit. Gave resources, encouraged her to buy patches. Highly encouraged more ex and [facility] which [facility]  [phone]  Elissa LovettReeve, Zamyra Allensworth Temescal ValleyKristan CES, ACSM 12/05/2014 9:04 AM

## 2014-12-05 NOTE — Discharge Instructions (Signed)

## 2014-12-05 NOTE — Discharge Summary (Signed)
Discharge Summary   Patient ID: Monica Thompson,  MRN: [id number], DOB/AGE: [birth year] 64 y.o.  Admit date: 11/30/2014 Discharge date: 12/05/2014  Primary Care Provider: A. Sherryll Burger, MD Primary Cardiologist: Junius Argyle, MD   Discharge Diagnoses Principal Problem:   NSTEMI, initial episode of care  **s/p PCI and DES placement within the LCX and LAD this admission.  Active Problems:   CAD (coronary artery disease)   Hypertension   Tobacco abuse   Morbid obesity   COPD (chronic obstructive pulmonary disease)   Hyperlipidemia  Allergies No Known Allergies  Procedures  Cardiac Catheterization and Percutaneous Coronary Intervention 2.18.2016 (LCX) and staged PCI to the LAD on 2.22.2016  HEMODYNAMICS:   Central Aorta:  105/60  Left Ventricle: 105/21  ANGIOGRAPHY:   The left main coronary artery was angiographically normal and bifurcated into the LAD and left circumflex coronary artery.   The LAD was a moderate size vessel.  There was diffuse area of 80% proximal stenosis after the first septal perforating artery and extending to involve the takeoff of a small first diagonal branch.  The remainder of the LAD was free of significant disease and gave rise to several diagonal branches and septal perforator arteries and extended to the LV apex.   **The LAD was successfully stented using a 3.5 x 18 mm Resolute DES on 2.22.2016.**  The left circumflex coronary artery was a large-caliber vessel.  Immediately after the takeoff of a smaller OM1 vessel.  There was 40% narrowing.  There was then 99% stenosis in the AV groove before the takeoff of the second marginal branch.  The vessel supplied a third marginal branch and in the posterior lateral like vessel.  The remaining branches were free of significant disease.   **The LCX was successfully stented using a 3.5 x 15 mm Resolute DES on 2.18.2016 and was noted to be patent on repeat cath on 2.22.2016.**  The RCA was a moderate size  vessel that had smooth 20% mid narrowing.  The vessel supplied the PDA and small PLA system. Left ventriculography revealed old LV dysfunction with mid to basal inferior hypocontractility with an ejection fraction of approximately 50%. _____________  2D Echocardiogram 2.21.2016   Study Conclusions  - Procedure narrative: Transthoracic echocardiography. Image   quality was suboptimal, with poor valvular visualization. The   study was technically difficult, as a result of poor acoustic   windows and body habitus. Intravenous contrast (Definity) was   administered. - Left ventricle: The cavity size was normal. There was mild   concentric hypertrophy. Systolic function was normal. The   estimated ejection fraction was in the range of 50% to 55%.   Doppler parameters are consistent with abnormal left ventricular   relaxation (grade 1 diastolic dysfunction). Doppler parameters   are consistent with high ventricular filling pressure. - Regional wall motion abnormality: Possible mild hypokinesis of   the mid inferoseptal and mid inferior myocardium. - Mitral valve: Mildly calcified annulus. There was mild   regurgitation. - Right atrium: The atrium was mildly dilated. - Pericardium, extracardiac: A trivial pericardial effusion was   identified. _____________  History of Present Illness  64 year old female with a prior history of hypertension and COPD who was in her usual state of health until approximately one month prior to admission when she began to experience intermittent chest discomfort. She was seen by primary care prescribed antacids for presumed reflux symptoms but did not have much improvement. She presented to the [facility] emergency room on January 28  with ongoing chest discomfort was noted to have a mild elevation in her troponin at 0.06. Troponin subsequently rose to 0.89 and decision was made to transfer to [facility] for further evaluation.  Hospital  Course  Patient ruled in for non-ST segment elevation myocardial infarction, eventually peaking her troponin at 13.67. She underwent diagnostic cardiac catheterization on February 18 revealing a 99% stenosis within the left circumflex as well as an 80% proximal stenosis in the LAD. The left circumflex was successfully treated using a 3.5 x 15 mm resolute drug-eluting stent and decision was made to pursue a staged intervention of the LAD. She did have mild chest discomfort post procedure but was able to be transferred out to the floor on February 19. She had no recurrence of chest discomfort over the weekend and echocardiography was performed on February 21 showing normal LV function with an EF of 50-55%. She was taken back to the cardiac catheterization laboratory on October 22 and subsequently underwent successful PCI and stenting of the LAD with placement of a 3.5 x 18 mm resolute drug-eluting stent. She tolerated procedure well and postprocedure has been ambulating without recurrent symptoms or limitations. She will be discharged home today in good condition and we have arranged for early follow-up in one week in our [place] clinic.  Discharge Vitals Blood pressure 111/39, pulse 61, temperature 98.2 F (36.8 C), temperature source Oral, resp. rate 17, height  (1.6 m), weight 280 lb 10.3 oz (127.3 kg), SpO2 96 %.  Filed Weights   12/03/14 0500 12/04/14 0500 12/05/14 0025  Weight: 282 lb 9.6 oz (128.187 kg) 280 lb 11.2 oz (127.325 kg) 280 lb 10.3 oz (127.3 kg)    Labs  CBC  Recent Labs  12/04/14 0305 12/05/14 0427  WBC 9.5 7.9  HGB 12.0 11.9*  HCT 36.2 35.9*  MCV 87.2 88.6  PLT 309 276   Basic Metabolic Panel  Recent Labs  12/05/14 0427  NA 139  K 4.2  CL 109  CO2 26  GLUCOSE 100*  BUN 9  CREATININE 0.83  CALCIUM 8.5   Liver Function Tests Lab Results  Component Value Date   ALT 18 11/09/2014   AST 17 11/09/2014   ALKPHOS 67 11/09/2014   BILITOT 0.4 11/09/2014     Cardiac Enzymes Lab Results  Component Value Date   CKTOTAL 543* 11/30/2014   CKMB 54.8* 11/30/2014   TROPONINI 13.67* 11/30/2014   Hemoglobin A1C Lab Results  Component Value Date   HGBA1C 6.3* 12/01/2014    Fasting Lipid Panel Lab Results  Component Value Date   CHOL 163 12/01/2014   HDL 31* 12/01/2014   LDLCALC 95 12/01/2014   TRIG 184* 12/01/2014   CHOLHDL 5.3 12/01/2014       Disposition  Pt is being discharged home today in good condition.  Physical Exam:  General: Pleasant, NAD. Neuro: Alert and oriented X 3. Moves all extremities spontaneously. Psych: Normal affect. HEENT: Normal Neck: Supple without bruits or JVD. Lungs: Resp regular and unlabored, CTA. Heart: RRR no s3, s4, or murmurs. Abdomen: Soft, non-tender, non-distended, BS + x 4.  Extremities: No clubbing, cyanosis or edema. DP/PT/Radials 2+ and equal bilaterally. R wrist cath site w/o bleeding/bruit/hematoma.  Follow-up Plans & Appointments  Follow-up Information    Follow up with Joni Reining, NP On 12/12/2014.   Specialty:  Nurse Practitioner   Why:  1:10 PM Dr. Sharene Skeans Nurse Practitioner   Contact information:   [address] [place] [address] [phone]  [facility] with [facility], [facility].   Specialty:  Internal Medicine   Why:  as scheduled.   Contact information:   [address]  [place] [id number] [phone]       Discharge Medications    Medication List    TAKE these medications        ALKA-SELTZER ANTACID PO  Take 2 tablets by mouth daily as needed (acid reflux).     ANTACID & ANTIGAS PO  Take 1 tablet by mouth daily as needed (for acid/gas).     aspirin EC 81 MG tablet  Take 1 tablet (81 mg total) by mouth daily.     atorvastatin 80 MG tablet  Commonly known as:  LIPITOR  Take 1 tablet (80 mg total) by mouth daily at 6 PM.     metoprolol tartrate 25 MG tablet  Commonly known as:  LOPRESSOR  Take 1 tablet (25 mg total) by mouth 2  (two) times daily.     nicotine 14 mg/24hr patch  Commonly known as:  NICODERM CQ - dosed in mg/24 hours  Place 1 patch (14 mg total) onto the skin daily.     nitroGLYCERIN 0.4 MG SL tablet  Commonly known as:  NITROSTAT  Place 1 tablet (0.4 mg total) under the tongue every 5 (five) minutes as needed for chest pain.     pantoprazole 20 MG tablet  Commonly known as:  PROTONIX  Take 1 tablet (20 mg total) by mouth daily.     ticagrelor 90 MG Tabs tablet  Commonly known as:  BRILINTA  Take 1 tablet (90 mg total) by mouth 2 (two) times daily.       Outstanding Labs/Studies  F/U lipids/lft's in 6-8 wks.  Duration of Discharge Encounter   Greater than 30 minutes including physician time.  Signed, Nicolasa Duckinghristopher Berge NP 12/05/2014, 8:23 AM  I saw and evaluated the patient this morning along with Mr. Verdie ShireBurge, NP, I agree with his discharge summary is noted above. The patient is stable status post staged PCI of the LAD yesterday after initial PCI of the Circumflex on February 18. She is stable for discharge on dual therapy and medications as indicated above. Follow-up as scheduled.   Marykay LexHARDING, Jadence Kinlaw W, M.D., M.S. Interventional Cardiologist   Pager # [phone]

## 2014-12-06 MED FILL — Sodium Chloride IV Soln 0.9%: INTRAVENOUS | Qty: 50 | Status: AC

## 2014-12-07 MED FILL — Perflutren Lipid Microsphere IV Susp 1.1 MG/ML: INTRAVENOUS | Qty: 10 | Status: AC

## 2014-12-12 ENCOUNTER — Encounter: Payer: Medicare HMO | Admitting: Adult Health

## 2014-12-13 ENCOUNTER — Ambulatory Visit (INDEPENDENT_AMBULATORY_CARE_PROVIDER_SITE_OTHER): Payer: Medicare HMO | Admitting: Physician Assistant

## 2014-12-13 ENCOUNTER — Encounter: Payer: Self-pay | Admitting: Physician Assistant

## 2014-12-13 VITALS — BP 130/68 | HR 54 | Ht 63.0 in | Wt 280.0 lb

## 2014-12-13 DIAGNOSIS — I2510 Atherosclerotic heart disease of native coronary artery without angina pectoris: Secondary | ICD-10-CM

## 2014-12-13 DIAGNOSIS — I10 Essential (primary) hypertension: Secondary | ICD-10-CM

## 2014-12-13 DIAGNOSIS — Z720 Tobacco use: Secondary | ICD-10-CM

## 2014-12-13 DIAGNOSIS — Z136 Encounter for screening for cardiovascular disorders: Secondary | ICD-10-CM

## 2014-12-13 DIAGNOSIS — E785 Hyperlipidemia, unspecified: Secondary | ICD-10-CM

## 2014-12-13 NOTE — Assessment & Plan Note (Signed)
Patient quit smoking the day of her MI. She is on nicotine patches. She still craves cigarettes and is having trouble with this. Discussed the importance of smoking cessation.

## 2014-12-13 NOTE — Assessment & Plan Note (Signed)
Weight loss and exercise program recommended to the patient.

## 2014-12-13 NOTE — Progress Notes (Signed)
Cardiology Office Note   Date:  12/13/2014   ID:  Monica Newcomervelyn L Digeronimo, DOB 1951/07/22, MRN [id number]  PCP:  Dr. Eduard ClosAshish Shaw  Cardiologist: Purvis SheffieldKoneswaran Chief Complaint: post hospital    History of Present Illness: Monica Thompson is a 64 y.o. female who presents for post hospital follow-up. She is status post non-ST segment elevation MI 11/30/14 treated with drug-eluting stent to the left circumflex. She had subsequent staged stent placed to the LAD 12/04/14. 2-D echo showed normal LV function EF 50-55%.  Patient comes in today accompanied by her daughter. She denies any chest pain, palpitations, dyspnea, dyspnea on exertion, dizziness, or presyncope. She quit smoking the day of admission but is struggling with this. She cannot afford to do cardiac rehabilitation as she is on a fixed income.    Past Medical History  Diagnosis Date  . PONV (postoperative nausea and vomiting)   . COPD (chronic obstructive pulmonary disease)   . Non-ST elevation (NSTEMI) myocardial infarction 11/2014  . CAD S/P percutaneous coronary angioplasty 11/2014    a. 11/2014 NSTEMI/PCI: LM nl, LAD 80p (3.5x18 Resolute DES), LCX 40p, 599m (3.5x15 Resolute DES), RCA 1754m, EF 50%.  . Hyperlipidemia   . Tobacco abuse   . Essential hypertension     Past Surgical History  Procedure Laterality Date  . Abdominal hysterectomy    . Tonsillectomy    . Cholecystectomy N/A 09/12/2013    Procedure: LAPAROSCOPIC CHOLECYSTECTOMY;  Surgeon: Dalia HeadingMark A Jenkins, MD;  Location: [facility];  Service: General;  Laterality: N/A;  . Left heart catheterization with coronary angiogram N/A 11/30/2014    Procedure: LEFT HEART CATHETERIZATION WITH CORONARY ANGIOGRAM;  Surgeon: Lennette Biharihomas A Kelly, MD;  [facility];  Service: Cardiovascular;  Laterality: N/A;  . Percutaneous coronary stent intervention (pci-s) N/A 12/04/2014    Procedure: PERCUTANEOUS CORONARY STENT INTERVENTION (PCI-S);  Surgeon: Lennette Biharihomas A Kelly, MD;  [facility];  Service:  Cardiovascular;  Laterality: N/A;     Current Outpatient Prescriptions  Medication Sig Dispense Refill  . Alum & Mag Hydroxide-Simeth (ANTACID & ANTIGAS PO) Take 1 tablet by mouth daily as needed (for acid/gas).    Marland Kitchen. aspirin EC 81 MG tablet Take 1 tablet (81 mg total) by mouth daily.    Marland Kitchen. atorvastatin (LIPITOR) 80 MG tablet Take 1 tablet (80 mg total) by mouth daily at 6 PM. 30 tablet 6  . Calcium Carbonate Antacid (ALKA-SELTZER ANTACID PO) Take 2 tablets by mouth daily as needed (acid reflux).    . metoprolol tartrate (LOPRESSOR) 25 MG tablet Take 1 tablet (25 mg total) by mouth 2 (two) times daily. 60 tablet 6  . nicotine (NICODERM CQ - DOSED IN MG/24 HOURS) 14 mg/24hr patch Place 1 patch (14 mg total) onto the skin daily. 28 patch 0  . nitroGLYCERIN (NITROSTAT) 0.4 MG SL tablet Place 1 tablet (0.4 mg total) under the tongue every 5 (five) minutes as needed for chest pain. 25 tablet 3  . pantoprazole (PROTONIX) 20 MG tablet Take 1 tablet (20 mg total) by mouth daily. 30 tablet 0  . ticagrelor (BRILINTA) 90 MG TABS tablet Take 1 tablet (90 mg total) by mouth 2 (two) times daily. 180 tablet 3   No current facility-administered medications for this visit.    Allergies:   Review of patient's allergies indicates no known allergies.    Social History:  The patient  reports that she quit smoking about 2 weeks ago. Her smoking use included Cigarettes. She smoked 0.50 packs per  day. She does not have any smokeless tobacco history on file. She reports that she does not drink alcohol or use illicit drugs.   Family History:  The patient's family history is not on file.    ROS:  Please see the history of present illness.   Otherwise, review of systems are positive for none.   All other systems are reviewed and negative.    PHYSICAL EXAM: VS:  BP 130/68 mmHg  Pulse 54  Ht  (1.6 m)  Wt 280 lb (127.007 kg)  BMI 49.61 kg/m2  SpO2 96% , BMI Body mass index is 49.61 kg/(m^2). GEN: Obese,  well developed, in no acute distress HEENT: normal Neck: no JVD, HJR, carotid bruits, or masses Cardiac: RRR; no gallop ,murmurs, rubs, thrill or heave,no edema,   Respiratory:  Decreased breath sounds but clear to auscultation bilaterally, normal work of breathing GI: soft, nontender, nondistended, + BS MS: no deformity or atrophy Extremities: Right arm without hematoma or hemorrhage at cath site good radial and brachial pulses otherwise lower extremities without cyanosis, clubbing, edema, good distal pulses bilaterally.  Skin: warm and dry, no rash Neuro:  Strength and sensation are intact Psych: euthymic mood, full affect   EKG:  EKG is ordered today. The ekg ordered today demonstrates normal sinus rhythm with incomplete right bundle branch block inferior Q waves and T-wave inversion inferior laterally no change when compared to EKG on 12/04/14. EKG from 212/23/16 did not have the T-wave inversion.   Recent Labs: 11/09/2014: ALT 18 11/30/2014: B Natriuretic Peptide 80.0 12/05/2014: BUN 9; Creatinine 0.83; Hemoglobin 11.9*; Platelets 276; Potassium 4.2; Sodium 139    Lipid Panel    Component Value Date/Time   CHOL 163 12/01/2014 0840   TRIG 184* 12/01/2014 0840   HDL 31* 12/01/2014 0840   CHOLHDL 5.3 12/01/2014 0840   VLDL 37 12/01/2014 0840   LDLCALC 95 12/01/2014 0840      Wt Readings from Last 3 Encounters:  12/13/14 280 lb (127.007 kg)  12/05/14 280 lb 10.3 oz (127.3 kg)  11/09/14 297 lb (134.718 kg)      Other studies Reviewed: Additional studies/ records that were reviewed today include and review of the records demonstrates: 2D Echocardiogram 2.21.2016   Study Conclusions  - Procedure narrative: Transthoracic echocardiography. Image   quality was suboptimal, with poor valvular visualization. The   study was technically difficult, as a result of poor acoustic   windows and body habitus. Intravenous contrast (Definity) was   administered. - Left ventricle: The  cavity size was normal. There was mild   concentric hypertrophy. Systolic function was normal. The   estimated ejection fraction was in the range of 50% to 55%.   Doppler parameters are consistent with abnormal left ventricular   relaxation (grade 1 diastolic dysfunction). Doppler parameters   are consistent with high ventricular filling pressure. - Regional wall motion abnormality: Possible mild hypokinesis of   the mid inferoseptal and mid inferior myocardium. - Mitral valve: Mildly calcified annulus. There was mild   regurgitation. - Right atrium: The atrium was mildly dilated. - Pericardium, extracardiac: A trivial pericardial effusion was   identified.  ANGIOGRAPHY:   The left main was angiographically normal and bifurcated into the LAD and circumflex vessel.  The LAD had 80% diffuse stenosis arising after the takeoff of the first septal perforating artery and extending to the takeoff of the first diagonal vessel. The LAD gave rise to several additional simple perforating arteries.  A smaller diagonal vessel  distally.  The left circumflex coronary artery was a dominant vessel and the stent which had been placed several days ago was widely patent, and position immediately between the OM1 and OM 2 vessel.  There was 0 residual stenosis.  There was brisk TIMI-3 flow.  Following PCI to the LAD with initial Angiosculpt scoring balloon, and ultimate insertion of a 3.518 mm Resolute DES stent postdilated with the 4.0 [place] Euphora balloon with stent taper from 3.90-3.78 mm, the entire stenosis was reduced to 0%.  He was an area of 10 to less than 20% spasm in the mid LAD beyond the dissection which improved with IC nitroglycerin.    Total contrast used:  130 cc Omnipaque  IMPRESSION:  Widely patent left circumflex mid AV groove stent from the patient's prior intervention on 11/30/2014 in the setting of an acute coronary syndrome/non-ST segment elevation MI.  Successful  PCI to the diffuse  80% proximal LAD stenosis treated with Angiosculpt scoring balloon and insertion of a 3.518 mm Resolute DES stent postdilated to 3.90, tapering to 3.78 mm with the entire region been reduced to 0%.   RECOMMENDATION:  Patient will be maintained on dual antiplatelet therapy for minimum of a year and subsequent to this should be treated with prolonged therapy as per the recent Pegasus trial data.  Aggressive lipid-lowering therapy with target LDL less than 70, beta blocker therapy and ACE inhibition will be continued.  Smoking cessation is imperative.    ASSESSMENT AND PLAN:  CAD (coronary artery disease) Patient had non-STEMI treated with drug-eluting stent to the left circumflex with subsequent staged stent placed to the LAD in 12/04/14. 2-D echo showed normal LV function. Patient is doing well post MI. She can't afford cardiac rehabilitation. I recommend an exercise and weight loss program for her. Follow-up with Dr.Koneswaran in 2 months.   Hypertension Blood pressure stable   Tobacco abuse Patient quit smoking the day of her MI. She is on nicotine patches. She still craves cigarettes and is having trouble with this. Discussed the importance of smoking cessation.   Morbid obesity Weight loss and exercise program recommended to the patient.   Hyperlipidemia Patient is on Lipitor. Check fasting lipid panel and LFTs in 6 weeks.     Elson Clan, PA-C  12/13/2014 11:29 AM    [facility] [address] [place], [place], [place]  [address] Phone: [phone]; Fax: [phone]

## 2014-12-13 NOTE — Assessment & Plan Note (Signed)
Patient had non-STEMI treated with drug-eluting stent to the left circumflex with subsequent staged stent placed to the LAD in 12/04/14. 2-D echo showed normal LV function. Patient is doing well post MI. She can't afford cardiac rehabilitation. I recommend an exercise and weight loss program for her. Follow-up with Dr.Koneswaran in 2 months.

## 2014-12-13 NOTE — Patient Instructions (Signed)
Your physician recommends that you schedule a follow-up appointment in: 2 months with Dr. Purvis SheffieldKoneswaran  Your physician discussed the importance of regular exercise and recommended that you start or continue a regular exercise program for good health.  Your physician recommends that you return for lab work in: 6 weeks   Thank you for choosing [facility]!

## 2014-12-13 NOTE — Assessment & Plan Note (Signed)
Blood pressure stable ? ?

## 2014-12-13 NOTE — Assessment & Plan Note (Signed)
Patient is on Lipitor. Check fasting lipid panel and LFTs in 6 weeks.

## 2015-01-22 ENCOUNTER — Telehealth: Payer: Self-pay | Admitting: Cardiovascular Disease

## 2015-01-22 NOTE — Telephone Encounter (Signed)
No samples currently,will ask again at apt in 2 weeks

## 2015-01-22 NOTE — Telephone Encounter (Signed)
Samples of Brilinta/tg  °

## 2015-02-12 ENCOUNTER — Ambulatory Visit (INDEPENDENT_AMBULATORY_CARE_PROVIDER_SITE_OTHER): Payer: Medicare HMO | Admitting: Cardiovascular Disease

## 2015-02-12 ENCOUNTER — Encounter: Payer: Self-pay | Admitting: Cardiovascular Disease

## 2015-02-12 VITALS — BP 118/68 | HR 52 | Ht 63.0 in | Wt 279.0 lb

## 2015-02-12 DIAGNOSIS — I10 Essential (primary) hypertension: Secondary | ICD-10-CM | POA: Diagnosis not present

## 2015-02-12 DIAGNOSIS — K921 Melena: Secondary | ICD-10-CM

## 2015-02-12 DIAGNOSIS — F17201 Nicotine dependence, unspecified, in remission: Secondary | ICD-10-CM

## 2015-02-12 DIAGNOSIS — I2510 Atherosclerotic heart disease of native coronary artery without angina pectoris: Secondary | ICD-10-CM

## 2015-02-12 DIAGNOSIS — E785 Hyperlipidemia, unspecified: Secondary | ICD-10-CM

## 2015-02-12 LAB — CBC
HCT: 37.4 % (ref 36.0–46.0)
Hemoglobin: 12.3 g/dL (ref 12.0–15.0)
MCH: 28 pg (ref 26.0–34.0)
MCHC: 32.9 g/dL (ref 30.0–36.0)
MCV: 85 fL (ref 78.0–100.0)
MPV: 9.7 fL (ref 8.6–12.4)
Platelets: 374 10*3/uL (ref 150–400)
RBC: 4.4 MIL/uL (ref 3.87–5.11)
RDW: 13.9 % (ref 11.5–15.5)
WBC: 10.9 10*3/uL — AB (ref 4.0–10.5)

## 2015-02-12 NOTE — Patient Instructions (Signed)
Your physician recommends that you schedule a follow-up appointment in: 3-4 months with Dr.Koneswaran   Your physician recommends that you continue on your current medications as directed. Please refer to the Current Medication list given to you today.    Please get lab work today (CBC)    Thank you for choosing [facility] !

## 2015-02-12 NOTE — Progress Notes (Signed)
Patient ID: Monica Thompson, female   DOB: 1951/06/10, 64 y.o.   MRN: [id number]      SUBJECTIVE: The patient presents for routine cardiovascular follow-up. She has a history of a non-STEMI and underwent drug-eluting stent placement to the left circumflex on 11/30/14 with a staged intervention to the LAD on 2/22. She also has a history of hypertension, hyperlipidemia, and tobacco abuse. Echocardiogram demonstrated normal left ventricular systolic function, EF 50-55%.  She is here with her daughter-in-law. Both she and her daughter-in-law have quit smoking. She denies chest pain and shortness of breath. She is trying to walk outside more. She also denies leg swelling. She has noticed that her stools have been more dark in the past 3 days. She denies bright red blood per rectum.   Review of Systems: As per "subjective", otherwise negative.  No Known Allergies  Current Outpatient Prescriptions  Medication Sig Dispense Refill  . Alum & Mag Hydroxide-Simeth (ANTACID & ANTIGAS PO) Take 1 tablet by mouth daily as needed (for acid/gas).    Marland Kitchen. aspirin EC 81 MG tablet Take 1 tablet (81 mg total) by mouth daily.    Marland Kitchen. atorvastatin (LIPITOR) 80 MG tablet Take 1 tablet (80 mg total) by mouth daily at 6 PM. 30 tablet 6  . Calcium Carbonate Antacid (ALKA-SELTZER ANTACID PO) Take 2 tablets by mouth daily as needed (acid reflux).    . metoprolol tartrate (LOPRESSOR) 25 MG tablet Take 1 tablet (25 mg total) by mouth 2 (two) times daily. 60 tablet 6  . nicotine (NICODERM CQ - DOSED IN MG/24 HOURS) 14 mg/24hr patch Place 1 patch (14 mg total) onto the skin daily. 28 patch 0  . nitroGLYCERIN (NITROSTAT) 0.4 MG SL tablet Place 1 tablet (0.4 mg total) under the tongue every 5 (five) minutes as needed for chest pain. 25 tablet 3  . pantoprazole (PROTONIX) 20 MG tablet Take 1 tablet (20 mg total) by mouth daily. 30 tablet 0  . ticagrelor (BRILINTA) 90 MG TABS tablet Take 1 tablet (90 mg total) by mouth 2 (two) times  daily. 180 tablet 3   No current facility-administered medications for this visit.    Past Medical History  Diagnosis Date  . PONV (postoperative nausea and vomiting)   . COPD (chronic obstructive pulmonary disease)   . Non-ST elevation (NSTEMI) myocardial infarction 11/2014  . CAD S/P percutaneous coronary angioplasty 11/2014    a. 11/2014 NSTEMI/PCI: LM nl, LAD 80p (3.5x18 Resolute DES), LCX 40p, 4982m (3.5x15 Resolute DES), RCA 6190m, EF 50%.  . Hyperlipidemia   . Tobacco abuse   . Essential hypertension     Past Surgical History  Procedure Laterality Date  . Abdominal hysterectomy    . Tonsillectomy    . Cholecystectomy N/A 09/12/2013    Procedure: LAPAROSCOPIC CHOLECYSTECTOMY;  Surgeon: Dalia HeadingMark A Jenkins, MD;  Location: [facility];  Service: General;  Laterality: N/A;  . Left heart catheterization with coronary angiogram N/A 11/30/2014    Procedure: LEFT HEART CATHETERIZATION WITH CORONARY ANGIOGRAM;  Surgeon: Lennette Biharihomas A Kelly, MD;  [facility];  Service: Cardiovascular;  Laterality: N/A;  . Percutaneous coronary stent intervention (pci-s) N/A 12/04/2014    Procedure: PERCUTANEOUS CORONARY STENT INTERVENTION (PCI-S);  Surgeon: Lennette Biharihomas A Kelly, MD;  [facility];  Service: Cardiovascular;  Laterality: N/A;    History   Social History  . Marital Status: Single    Spouse Name: N/A  . Number of Children: N/A  . Years of Education: N/A   Occupational  History  . Not on file.   Social History Main Topics  . Smoking status: Former Smoker -- 0.50 packs/day    Types: Cigarettes    Quit date: 11/29/2014  . Smokeless tobacco: Not on file  . Alcohol Use: No  . Drug Use: No  . Sexual Activity: Not on file   Other Topics Concern  . Not on file   Social History Narrative     Filed Vitals:   02/12/15 1137  BP: 118/68  Pulse: 52  Height:  (1.6 m)  Weight: 279 lb (126.554 kg)  SpO2: 99%    PHYSICAL EXAM General: NAD HEENT: Normal. Neck: Difficult to assess  JVP. Lungs: Clear to auscultation bilaterally with normal respiratory effort. CV: Regular rate and rhythm, normal S1/S2, no S3/S4, no murmur. No pretibial or periankle edema.  No carotid bruit.   Abdomen: Soft, nontender, obese.  Neurologic: Alert and oriented x 3.  Psych: Normal affect. Skin: Normal. Musculoskeletal: No gross deformities. Extremities: No clubbing or cyanosis.   ECG: Most recent ECG reviewed.      ASSESSMENT AND PLAN: 1. CAD with h/o NSTEMI and stents to LCx and LAD: Stable ischemic heart disease. Continue aspirin, Lipitor, metoprolol, and Brilinta.  2. Essential HTN: Well controlled. No changes.  3. Hyperlipidemia: Continue high intensity statin therapy with Lipitor 80 mg.  4. Tobacco abuse in remission: Remains nicotine-free.  5. Melena: Will check CBC.  Dispo: f/u 3-4 months.   Prentice Docker, M.D., F.A.C.C.

## 2015-03-27 ENCOUNTER — Other Ambulatory Visit: Payer: Self-pay | Admitting: Cardiovascular Disease

## 2015-03-27 NOTE — Telephone Encounter (Signed)
Sample of Brilinta / tg

## 2015-03-27 NOTE — Telephone Encounter (Signed)
4 bottles brilinta  Given 90 mg

## 2015-06-04 ENCOUNTER — Ambulatory Visit: Payer: Medicare HMO | Admitting: Cardiovascular Disease

## 2015-06-04 ENCOUNTER — Telehealth: Payer: Self-pay | Admitting: Cardiovascular Disease

## 2015-06-04 ENCOUNTER — Other Ambulatory Visit: Payer: Self-pay | Admitting: Cardiovascular Disease

## 2015-06-04 MED ORDER — ATORVASTATIN CALCIUM 80 MG PO TABS: 80.0000 mg | ORAL_TABLET | Freq: Every day | ORAL | Status: DC

## 2015-06-04 MED ORDER — METOPROLOL TARTRATE 25 MG PO TABS: 25.0000 mg | ORAL_TABLET | Freq: Two times a day (BID) | ORAL | Status: DC

## 2015-06-04 MED ORDER — TICAGRELOR 90 MG PO TABS: 90.0000 mg | ORAL_TABLET | Freq: Two times a day (BID) | ORAL | Status: DC

## 2015-06-04 MED ORDER — PANTOPRAZOLE SODIUM 20 MG PO TBEC: 20.0000 mg | DELAYED_RELEASE_TABLET | Freq: Every day | ORAL | Status: DC

## 2015-06-04 NOTE — Telephone Encounter (Signed)
Cardiac meds refilled.

## 2015-06-04 NOTE — Telephone Encounter (Signed)
Refilled today at 11:59 confirmed by fax     Pt complains of leg cramps and no energy wonder if if it is lipitor,will send to Dr Purvis Sheffield

## 2015-06-04 NOTE — Telephone Encounter (Signed)
Difficult to say. Can reduce to 40 mg to see if this improves symptoms.

## 2015-06-04 NOTE — Telephone Encounter (Signed)
Needs medication refills only has two days left on them.  [facility] in [place]

## 2015-06-04 NOTE — Telephone Encounter (Signed)
Needs refills out of medication. Left message this morning  [facility] in [place]. Please contact patient

## 2015-06-05 MED ORDER — ATORVASTATIN CALCIUM 40 MG PO TABS: 40.0000 mg | ORAL_TABLET | Freq: Every day | ORAL | Status: DC

## 2015-06-05 NOTE — Telephone Encounter (Signed)
Monica Thompson is returning a telephone call

## 2015-06-05 NOTE — Telephone Encounter (Signed)
PT made aware of dose change of Lipitor to 40 mg daily. She voiced understanding

## 2015-06-14 ENCOUNTER — Ambulatory Visit (INDEPENDENT_AMBULATORY_CARE_PROVIDER_SITE_OTHER): Payer: Medicare HMO | Admitting: Cardiovascular Disease

## 2015-06-14 ENCOUNTER — Encounter (INDEPENDENT_AMBULATORY_CARE_PROVIDER_SITE_OTHER): Payer: Self-pay

## 2015-06-14 ENCOUNTER — Encounter: Payer: Self-pay | Admitting: Cardiovascular Disease

## 2015-06-14 VITALS — BP 130/80 | HR 74 | Ht 63.0 in | Wt 288.4 lb

## 2015-06-14 DIAGNOSIS — I10 Essential (primary) hypertension: Secondary | ICD-10-CM

## 2015-06-14 DIAGNOSIS — F17201 Nicotine dependence, unspecified, in remission: Secondary | ICD-10-CM

## 2015-06-14 DIAGNOSIS — M25473 Effusion, unspecified ankle: Secondary | ICD-10-CM

## 2015-06-14 DIAGNOSIS — I2510 Atherosclerotic heart disease of native coronary artery without angina pectoris: Secondary | ICD-10-CM | POA: Diagnosis not present

## 2015-06-14 DIAGNOSIS — E785 Hyperlipidemia, unspecified: Secondary | ICD-10-CM

## 2015-06-14 MED ORDER — POTASSIUM CHLORIDE ER 10 MEQ PO TBCR: EXTENDED_RELEASE_TABLET | ORAL | Status: DC

## 2015-06-14 MED ORDER — FUROSEMIDE 20 MG PO TABS: ORAL_TABLET | ORAL | Status: DC

## 2015-06-14 NOTE — Progress Notes (Signed)
Patient ID: Monica Thompson, female   DOB: Apr 21, 1951, 65 y.o.   MRN: [id number]      SUBJECTIVE: The patient presents for routine cardiovascular follow-up. She has a history of a non-STEMI and underwent drug-eluting stent placement to the left circumflex on 11/30/14 with a staged intervention to the LAD on 2/22. She also has a history of hypertension, hyperlipidemia, and tobacco abuse. Echocardiogram demonstrated normal left ventricular systolic function, EF 50-55%.  She has been experiencing leg cramps and "lack of energy" and wondered if it was due to Lipitor, so I reduced the dose to 40 mg.  Since doing so, her leg cramps have subsided as have her headaches. Brilinta is quite expensive for her. She has occasional ankle swelling bilaterally and asks about taking Lasix as needed.  Review of Systems: As per "subjective", otherwise negative.  No Known Allergies  Current Outpatient Prescriptions  Medication Sig Dispense Refill  . Alum & Mag Hydroxide-Simeth (ANTACID & ANTIGAS PO) Take 1 tablet by mouth daily as needed (for acid/gas).    Marland Kitchen aspirin EC 81 MG tablet Take 1 tablet (81 mg total) by mouth daily.    Marland Kitchen atorvastatin (LIPITOR) 40 MG tablet Take 1 tablet (40 mg total) by mouth daily. 90 tablet 3  . metoprolol tartrate (LOPRESSOR) 25 MG tablet Take 1 tablet (25 mg total) by mouth 2 (two) times daily. 60 tablet 6  . nitroGLYCERIN (NITROSTAT) 0.4 MG SL tablet Place 1 tablet (0.4 mg total) under the tongue every 5 (five) minutes as needed for chest pain. 25 tablet 3  . pantoprazole (PROTONIX) 20 MG tablet Take 1 tablet (20 mg total) by mouth daily. 30 tablet 6  . ticagrelor (BRILINTA) 90 MG TABS tablet Take 1 tablet (90 mg total) by mouth 2 (two) times daily. 180 tablet 3   No current facility-administered medications for this visit.    Past Medical History  Diagnosis Date  . PONV (postoperative nausea and vomiting)   . COPD (chronic obstructive pulmonary disease)   . Non-ST elevation  (NSTEMI) myocardial infarction 11/2014  . CAD S/P percutaneous coronary angioplasty 11/2014    a. 11/2014 NSTEMI/PCI: LM nl, LAD 80p (3.5x18 Resolute DES), LCX 40p, 43m (3.5x15 Resolute DES), RCA 63m, EF 50%.  . Hyperlipidemia   . Tobacco abuse   . Essential hypertension     Past Surgical History  Procedure Laterality Date  . Abdominal hysterectomy    . Tonsillectomy    . Cholecystectomy N/A 09/12/2013    Procedure: LAPAROSCOPIC CHOLECYSTECTOMY;  Surgeon: Dalia Heading, MD;  Location: [facility];  Service: General;  Laterality: N/A;  . Left heart catheterization with coronary angiogram N/A 11/30/2014    Procedure: LEFT HEART CATHETERIZATION WITH CORONARY ANGIOGRAM;  Surgeon: Lennette Bihari, MD;  Location: [facility] CATH LAB;  Service: Cardiovascular;  Laterality: N/A;  . Percutaneous coronary stent intervention (pci-s) N/A 12/04/2014    Procedure: PERCUTANEOUS CORONARY STENT INTERVENTION (PCI-S);  Surgeon: Lennette Bihari, MD;  Location[facility] CATH LAB;  Service: Cardiovascular;  Laterality: N/A;    Social History   Social History  . Marital Status: Single    Spouse Name: N/A  . Number of Children: N/A  . Years of Education: N/A   Occupational History  . Not on file.   Social History Main Topics  . Smoking status: Former Smoker -- 0.50 packs/day    Types: Cigarettes    Quit date: 11/29/2014  . Smokeless tobacco: Not on file  . Alcohol Use: No  . Drug  Use: No  . Sexual Activity: Not on file   Other Topics Concern  . Not on file   Social History Narrative     Filed Vitals:   06/14/15 1117  BP: 130/80  Pulse: 74  Height:  (1.6 m)  Weight: 288 lb 6.4 oz (130.817 kg)  SpO2: 97%    PHYSICAL EXAM General: NAD HEENT: Normal. Neck: Difficult to assess JVP. Lungs: Diminished but clear, no rales or wheezes. CV: Regular rate and rhythm, normal S1/S2, no S3/S4, no murmur. Mild bilateral periankle edema.   Abdomen: Soft, nontender, obese.  Neurologic: Alert and oriented x 3.   Psych: Normal affect. Skin: Normal. Musculoskeletal: No gross deformities. Extremities: No clubbing or cyanosis.    ECG: Most recent ECG reviewed.      ASSESSMENT AND PLAN: 1. CAD with h/o NSTEMI and stents to LCx and LAD: Stable ischemic heart disease. Continue aspirin, Lipitor, metoprolol, and Brilinta (will see if she qualifies for patient assistance, and provide samples as well).  2. Essential HTN: Well controlled. No changes.  3. Hyperlipidemia: Continue high intensity statin therapy with Lipitor 40 mg.  4. Tobacco abuse in remission: Remains nicotine-free.  5. Ankle swelling: Will prescribe Lasix 20 mg with KCl 10 meq prn.  Dispo: f/u 6 months.   Prentice Docker, M.D., F.A.C.C.

## 2015-06-14 NOTE — Patient Instructions (Signed)
Your physician wants you to follow-up in: 6 months with Dr Reggy Eye will receive a reminder letter in the mail two months in advance. If you don't receive a letter, please call our office to schedule the follow-up appointment.   Take lasix 20 mg daily ONLY as needed for leg swelling  Take potassium 10 meq daily when you take Lasix     Thank you for choosing [facility] !

## 2015-07-10 ENCOUNTER — Other Ambulatory Visit: Payer: Self-pay | Admitting: Cardiovascular Disease

## 2015-07-10 NOTE — Telephone Encounter (Signed)
Patient says she has enough Brilinta to last until Friday, 9/30 and is asking if we have any samples she could have after that.

## 2015-07-10 NOTE — Telephone Encounter (Signed)
Only 2 bottles available,pt to bring documents for brilinta pt assistance foundation

## 2015-08-14 ENCOUNTER — Telehealth: Payer: Self-pay | Admitting: *Deleted

## 2015-08-14 NOTE — Telephone Encounter (Signed)
Spoke with [employer] about status of pt Brilinta prescription assistance program. Pt has been approved for Brilinta through 06/2016. Pt has been notified of approval and that she will need to call [employer] to refill her Brilinta when she gets low. Pt voiced understanding.

## 2015-11-14 IMAGING — CR DG CHEST 1V PORT
1 series · 2 of 2 positions shown · non-contrast
Comparison: 09/11/2013

CLINICAL DATA: Central chest pain and intermittent dyspnea,
worsening in severity over the last week

EXAM:
PORTABLE CHEST - 1 VIEW

[Series 1: portable · 0.17mm/px · 2 of 2 slices shown]
[im 1/2]
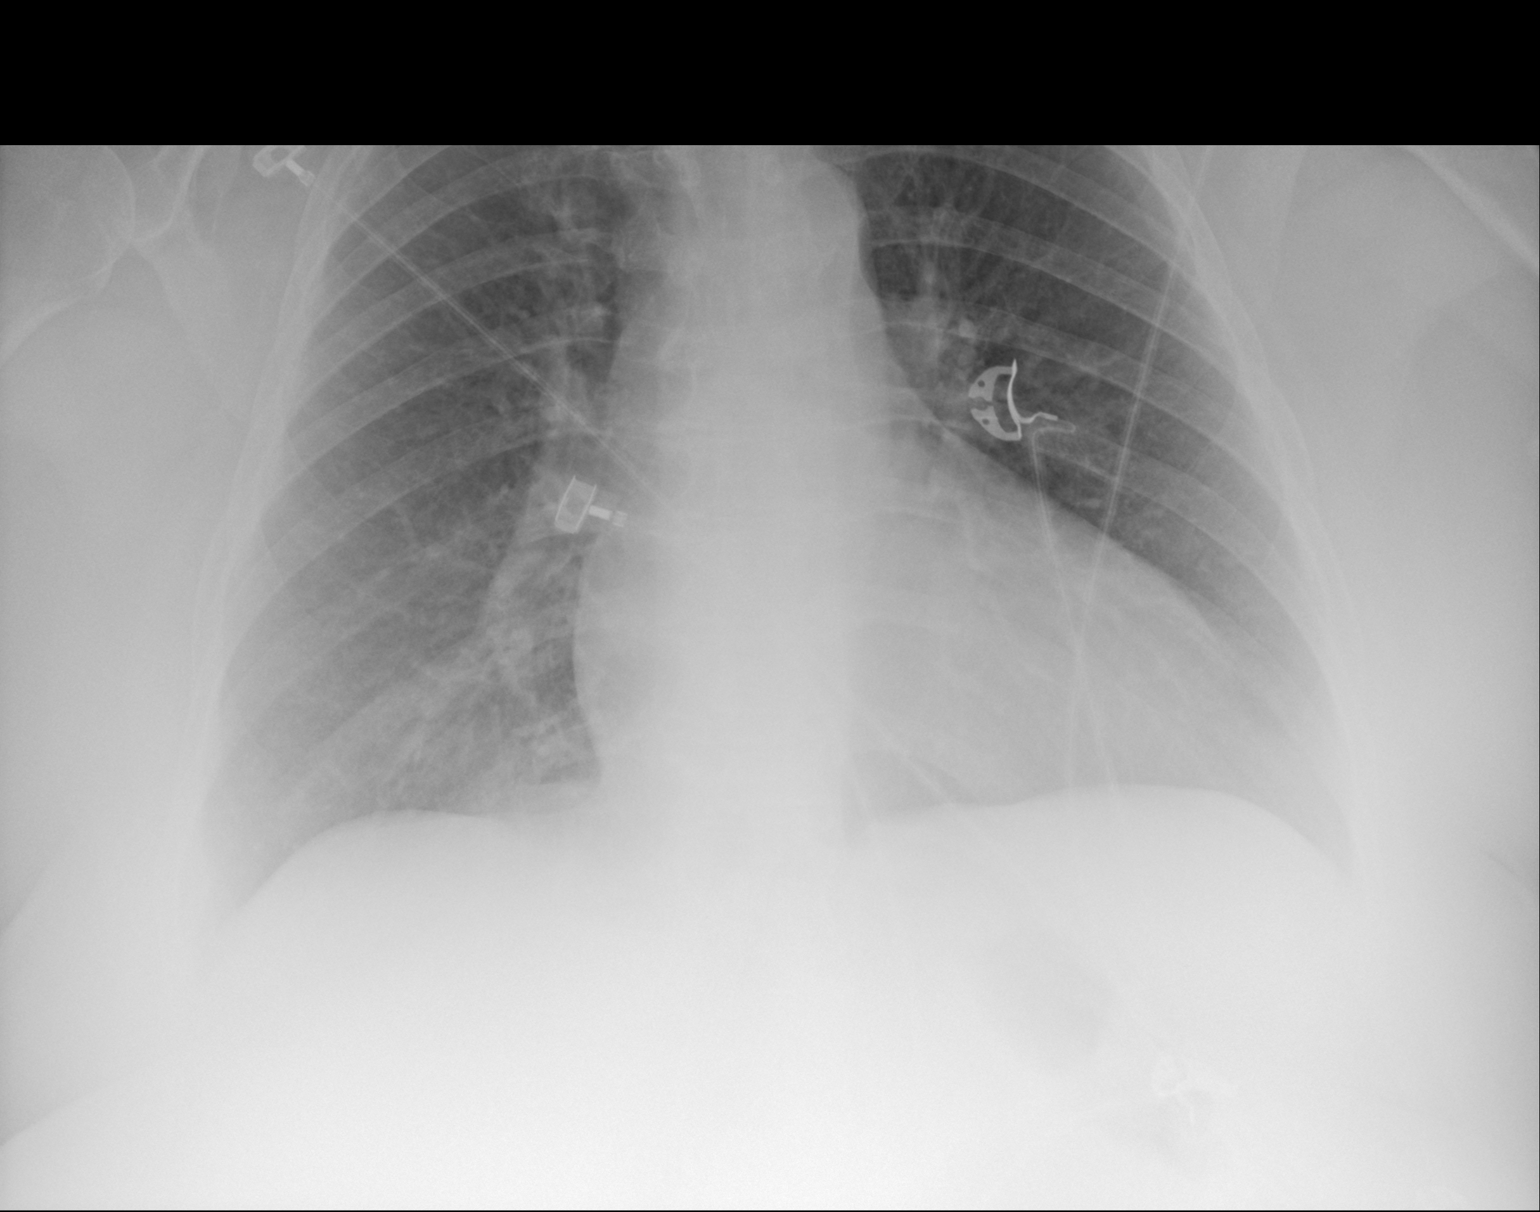
[im 2/2]
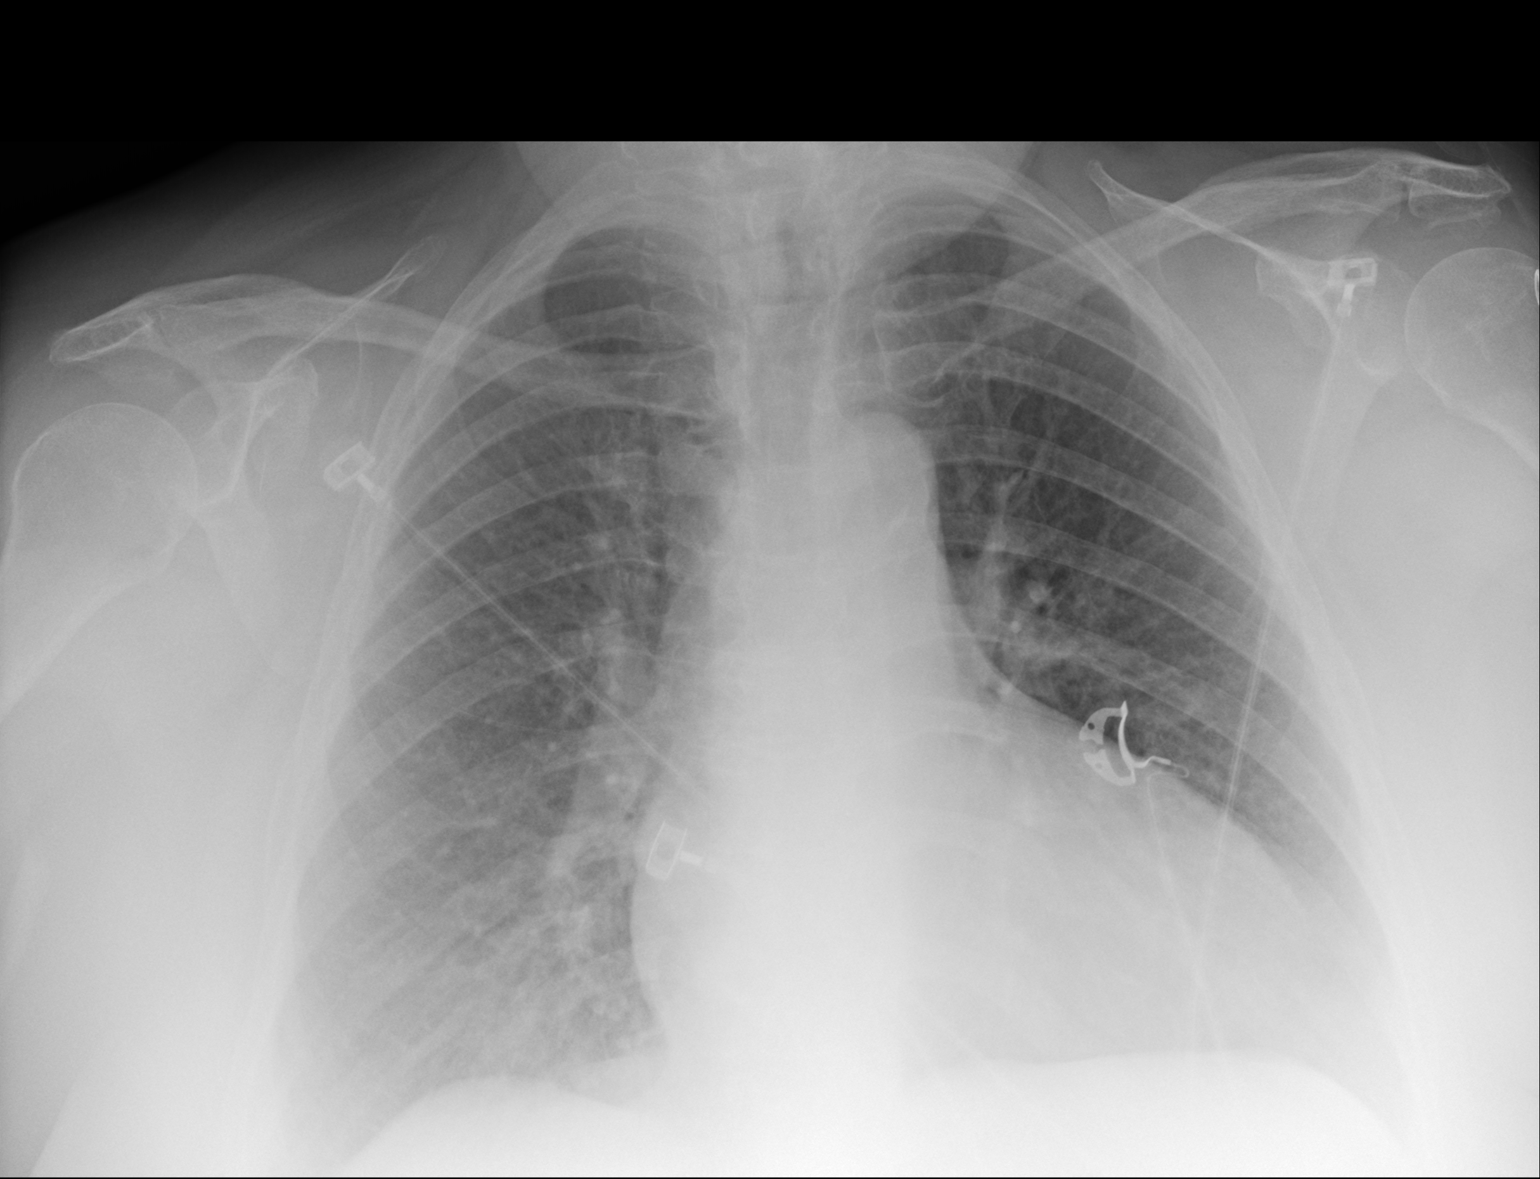

[2 of 2 positions shown; findings below may reference images not displayed]

FINDINGS: There is unchanged mild cardiomegaly. Hilar and mediastinal contours
are unchanged. The lungs are clear. There are no effusions.
Pulmonary vasculature is normal.
IMPRESSION: Unchanged cardiomegaly.  No acute findings

## 2015-12-05 IMAGING — CR DG CHEST 1V PORT
1 series · 1 of 1 positions shown · non-contrast
Comparison: 11/09/2014

CLINICAL DATA: Central chest pain radiating the left arm

EXAM:
PORTABLE CHEST - 1 VIEW

[portable]
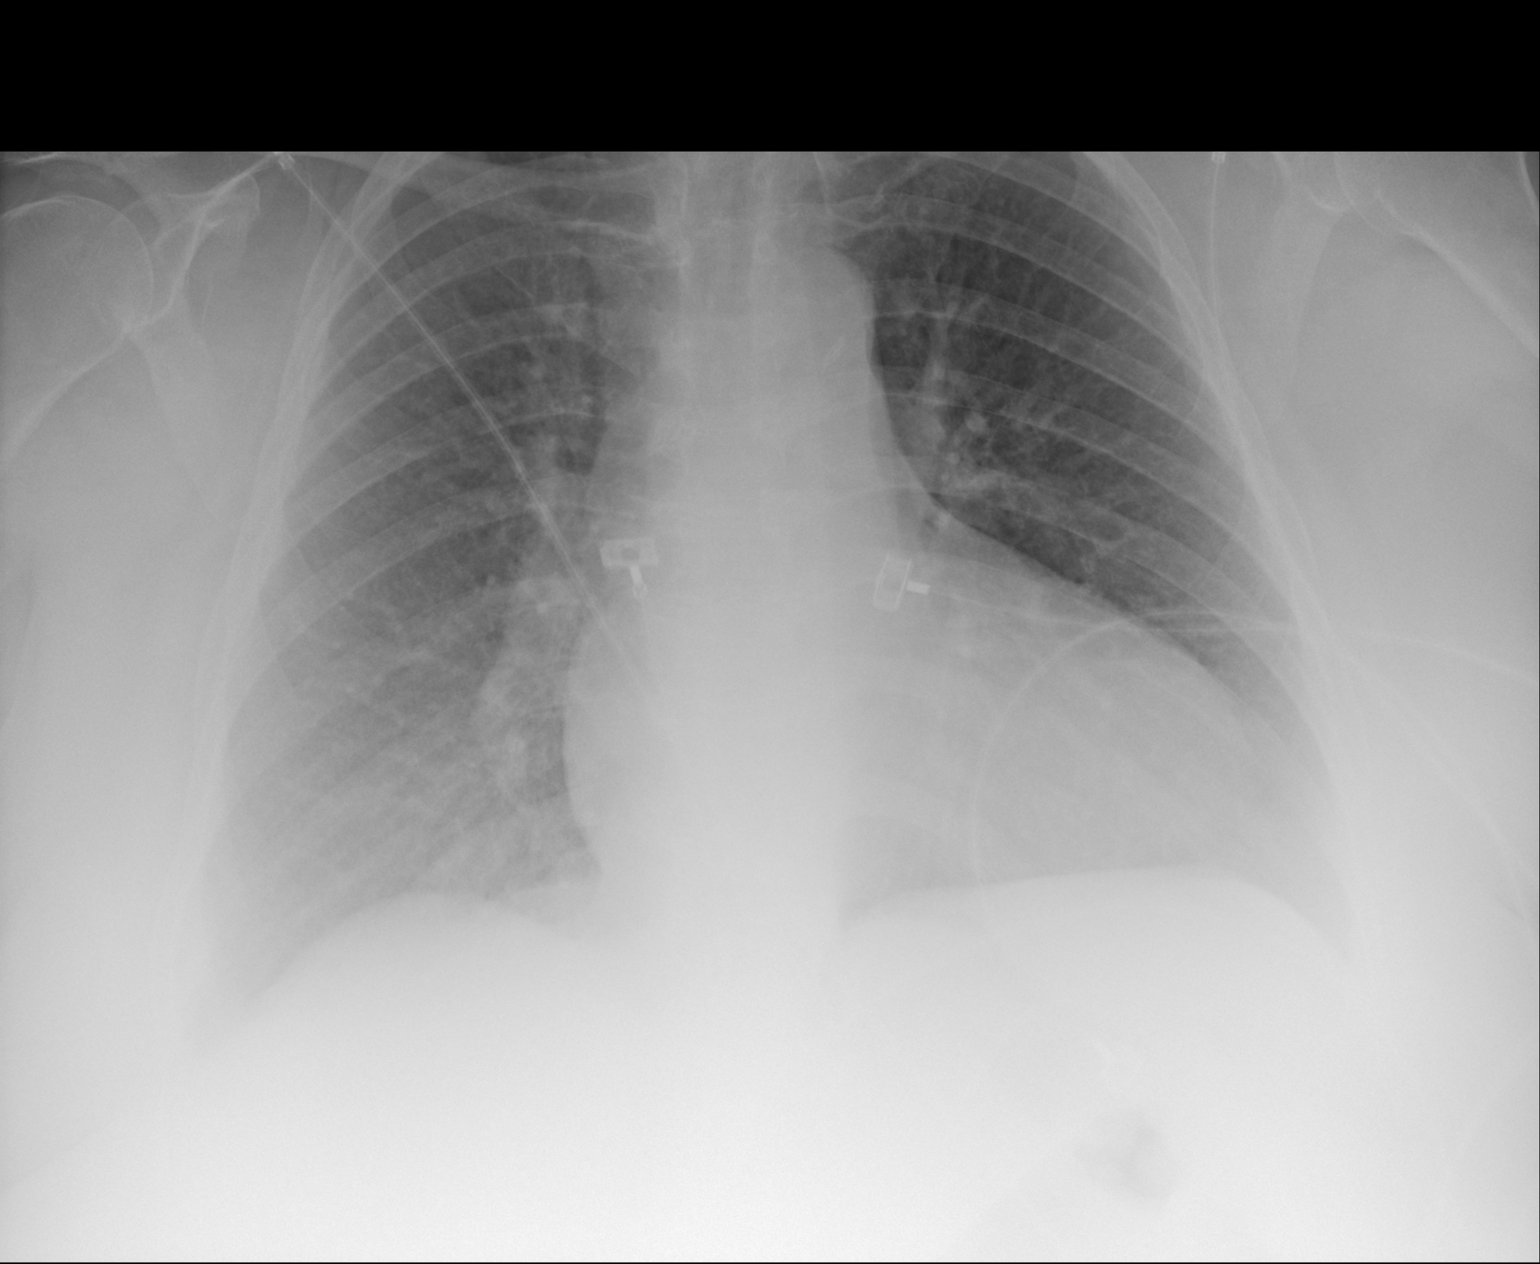

[1 of 1 positions shown; findings below may reference images not displayed]

FINDINGS: There is moderate cardiomegaly, unchanged. The lungs are clear.
There are no large effusions.
IMPRESSION: Cardiomegaly.

## 2015-12-19 ENCOUNTER — Ambulatory Visit (INDEPENDENT_AMBULATORY_CARE_PROVIDER_SITE_OTHER): Payer: Medicare HMO | Admitting: Cardiovascular Disease

## 2015-12-19 ENCOUNTER — Encounter: Payer: Self-pay | Admitting: Cardiovascular Disease

## 2015-12-19 VITALS — BP 126/76 | HR 75 | Ht 63.0 in | Wt 284.0 lb

## 2015-12-19 DIAGNOSIS — I2510 Atherosclerotic heart disease of native coronary artery without angina pectoris: Secondary | ICD-10-CM | POA: Diagnosis not present

## 2015-12-19 DIAGNOSIS — I10 Essential (primary) hypertension: Secondary | ICD-10-CM | POA: Diagnosis not present

## 2015-12-19 DIAGNOSIS — E785 Hyperlipidemia, unspecified: Secondary | ICD-10-CM | POA: Diagnosis not present

## 2015-12-19 MED ORDER — TICAGRELOR 60 MG PO TABS: 60.0000 mg | ORAL_TABLET | Freq: Two times a day (BID) | ORAL | Status: DC

## 2015-12-19 NOTE — Progress Notes (Signed)
Patient ID: Monica Thompson, female   DOB: 10-17-1950, 65 y.o.   MRN: [id number]      SUBJECTIVE: The patient presents for routine cardiovascular follow-up. She has a history of a non-STEMI and underwent drug-eluting stent placement to the left circumflex on 11/30/14 with a staged intervention to the LAD on 2/22. She also has a history of hypertension, hyperlipidemia, and tobacco abuse. Echocardiogram demonstrated normal left ventricular systolic function, EF 50-55%.  She denies exertional chest pain and shortness of breath. She has had some nosebleeds recently. She continues to abstain from cigarette smoking.   Review of Systems: As per "subjective", otherwise negative.  No Known Allergies  Current Outpatient Prescriptions  Medication Sig Dispense Refill  . albuterol (PROVENTIL HFA;VENTOLIN HFA) 108 (90 Base) MCG/ACT inhaler Inhale 1-2 puffs into the lungs every 6 (six) hours as needed for wheezing or shortness of breath.    Marland Kitchen. aspirin EC 81 MG tablet Take 1 tablet (81 mg total) by mouth daily.    Marland Kitchen. atorvastatin (LIPITOR) 40 MG tablet Take 1 tablet (40 mg total) by mouth daily. 90 tablet 3  . metoprolol tartrate (LOPRESSOR) 25 MG tablet Take 1 tablet (25 mg total) by mouth 2 (two) times daily. 60 tablet 6  . nitroGLYCERIN (NITROSTAT) 0.4 MG SL tablet Place 1 tablet (0.4 mg total) under the tongue every 5 (five) minutes as needed for chest pain. 25 tablet 3  . pantoprazole (PROTONIX) 20 MG tablet Take 1 tablet (20 mg total) by mouth daily. 30 tablet 6  . ticagrelor (BRILINTA) 90 MG TABS tablet Take 1 tablet (90 mg total) by mouth 2 (two) times daily. 180 tablet 3  . furosemide (LASIX) 20 MG tablet Take 20 mg daily ONLY as needed for leg swelling (Patient not taking: Reported on 12/19/2015) 60 tablet 3  . potassium chloride (K-DUR) 10 MEQ tablet Take 10 meq daily ONLY when you take Lasix (Patient not taking: Reported on 12/19/2015) 60 tablet 3   No current facility-administered medications for  this visit.    Past Medical History  Diagnosis Date  . PONV (postoperative nausea and vomiting)   . COPD (chronic obstructive pulmonary disease) (HCC)   . Non-ST elevation (NSTEMI) myocardial infarction (HCC) 11/2014  . CAD S/P percutaneous coronary angioplasty 11/2014    a. 11/2014 NSTEMI/PCI: LM nl, LAD 80p (3.5x18 Resolute DES), LCX 40p, 5173m (3.5x15 Resolute DES), RCA 7290m, EF 50%.  . Hyperlipidemia   . Tobacco abuse   . Essential hypertension     Past Surgical History  Procedure Laterality Date  . Abdominal hysterectomy    . Tonsillectomy    . Cholecystectomy N/A 09/12/2013    Procedure: LAPAROSCOPIC CHOLECYSTECTOMY;  Surgeon: Dalia HeadingMark A Jenkins, MD;  Location: [facility];  Service: General;  Laterality: N/A;  . Left heart catheterization with coronary angiogram N/A 11/30/2014    Procedure: LEFT HEART CATHETERIZATION WITH CORONARY ANGIOGRAM;  Surgeon: Lennette Biharihomas A Kelly, MD;  [facility];  Service: Cardiovascular;  Laterality: N/A;  . Percutaneous coronary stent intervention (pci-s) N/A 12/04/2014    Procedure: PERCUTANEOUS CORONARY STENT INTERVENTION (PCI-S);  Surgeon: Lennette Biharihomas A Kelly, MD;  [facility];  Service: Cardiovascular;  Laterality: N/A;    Social History   Social History  . Marital Status: Single    Spouse Name: N/A  . Number of Children: N/A  . Years of Education: N/A   Occupational History  . Not on file.   Social History Main Topics  . Smoking status: Former Smoker --  0.50 packs/day    Types: Cigarettes    Quit date: 11/29/2014  . Smokeless tobacco: Not on file  . Alcohol Use: No  . Drug Use: No  . Sexual Activity: Not on file   Other Topics Concern  . Not on file   Social History Narrative     Filed Vitals:   12/19/15 1148  BP: 126/76  Pulse: 75  Height:  (1.6 m)  Weight: 284 lb (128.822 kg)  SpO2: 96%    PHYSICAL EXAM General: NAD HEENT: Normal. Neck: Difficult to assess JVP. Lungs: Diminished but clear, no rales or  wheezes. CV: Distant heart tones. Regular rate and rhythm, normal S1/S2, no S3/S4, no murmur. Trace bilateral periankle edema.  Abdomen: Soft, nontender, obese.  Neurologic: Alert and oriented x 3.  Psych: Normal affect. Skin: Normal. Musculoskeletal: No gross deformities. Extremities: No clubbing or cyanosis.   ECG: Most recent ECG reviewed.    ASSESSMENT AND PLAN: 1. CAD with h/o NSTEMI and stents to LCx and LAD: Stable ischemic heart disease. Continue aspirin, Lipitor, metoprolol, and Brilinta (will reduce to 60 mg BID).  2. Essential HTN: Well controlled. No changes.  3. Hyperlipidemia: Continue high intensity statin therapy with Lipitor 40 mg.  Dispo: f/u 1 year.  Prentice Docker, M.D., F.A.C.C.

## 2015-12-19 NOTE — Patient Instructions (Signed)
Medication Instructions:  DECREASE BRILINTA  TO 60 MG TWO TIMES DAILY   Labwork: NONE  Testing/Procedures: NONE  Follow-Up: Your physician wants you to follow-up in: 1 YEAR WITH DR. Purvis SheffieldKONESWARAN.  You will receive a reminder letter in the mail two months in advance. If you don't receive a letter, please call our office to schedule the follow-up appointment.   Any Other Special Instructions Will Be Listed Below (If Applicable).     If you need a refill on your cardiac medications before your next appointment, please call your pharmacy.

## 2016-03-11 ENCOUNTER — Telehealth: Payer: Self-pay | Admitting: Cardiovascular Disease

## 2016-03-11 NOTE — Telephone Encounter (Signed)
Sent new rx for Dr. Purvis SheffieldKoneswaran to sign. [facility] once he signs it and they fax it back to me.

## 2016-03-11 NOTE — Telephone Encounter (Signed)
pls call the pt concerning her ticagrelor (BRILINTA) 60 MG TABS tablet [[id number]][[id number]]. Her pharmacy is needing a revised Rx

## 2016-10-23 ENCOUNTER — Telehealth: Payer: Self-pay | Admitting: Cardiology

## 2016-10-23 NOTE — Telephone Encounter (Signed)
Spoke with pt, let her know we have sent her assistance application in and will call to check on status. Will give samples.

## 2016-10-23 NOTE — Telephone Encounter (Signed)
Patient has questions regarding Brilinta assistance. / tg

## 2016-10-24 ENCOUNTER — Other Ambulatory Visit: Payer: Self-pay | Admitting: *Deleted

## 2016-10-24 ENCOUNTER — Telehealth: Payer: Self-pay | Admitting: Physician Assistant

## 2016-10-24 MED ORDER — TICAGRELOR 60 MG PO TABS: 60.0000 mg | ORAL_TABLET | Freq: Two times a day (BID) | ORAL | 0 refills | Status: DC

## 2016-10-24 NOTE — Telephone Encounter (Signed)
Error/tg °

## 2016-10-28 DIAGNOSIS — E8881 Metabolic syndrome: Secondary | ICD-10-CM | POA: Diagnosis not present

## 2016-10-28 DIAGNOSIS — Z6841 Body Mass Index (BMI) 40.0 and over, adult: Secondary | ICD-10-CM | POA: Diagnosis not present

## 2016-10-28 DIAGNOSIS — Z87891 Personal history of nicotine dependence: Secondary | ICD-10-CM | POA: Diagnosis not present

## 2016-10-28 DIAGNOSIS — M1710 Unilateral primary osteoarthritis, unspecified knee: Secondary | ICD-10-CM | POA: Diagnosis not present

## 2016-10-28 DIAGNOSIS — E7800 Pure hypercholesterolemia, unspecified: Secondary | ICD-10-CM | POA: Diagnosis not present

## 2016-10-28 DIAGNOSIS — Z299 Encounter for prophylactic measures, unspecified: Secondary | ICD-10-CM | POA: Diagnosis not present

## 2016-10-28 DIAGNOSIS — E559 Vitamin D deficiency, unspecified: Secondary | ICD-10-CM | POA: Diagnosis not present

## 2016-10-28 DIAGNOSIS — I672 Cerebral atherosclerosis: Secondary | ICD-10-CM | POA: Diagnosis not present

## 2016-12-03 DIAGNOSIS — I2510 Atherosclerotic heart disease of native coronary artery without angina pectoris: Secondary | ICD-10-CM | POA: Diagnosis not present

## 2016-12-03 DIAGNOSIS — E7800 Pure hypercholesterolemia, unspecified: Secondary | ICD-10-CM | POA: Diagnosis not present

## 2016-12-26 DIAGNOSIS — I672 Cerebral atherosclerosis: Secondary | ICD-10-CM | POA: Diagnosis not present

## 2016-12-26 DIAGNOSIS — E7800 Pure hypercholesterolemia, unspecified: Secondary | ICD-10-CM | POA: Diagnosis not present

## 2016-12-26 DIAGNOSIS — I10 Essential (primary) hypertension: Secondary | ICD-10-CM | POA: Diagnosis not present

## 2016-12-26 DIAGNOSIS — Z713 Dietary counseling and surveillance: Secondary | ICD-10-CM | POA: Diagnosis not present

## 2016-12-26 DIAGNOSIS — R21 Rash and other nonspecific skin eruption: Secondary | ICD-10-CM | POA: Diagnosis not present

## 2016-12-26 DIAGNOSIS — Z299 Encounter for prophylactic measures, unspecified: Secondary | ICD-10-CM | POA: Diagnosis not present

## 2016-12-26 DIAGNOSIS — M1710 Unilateral primary osteoarthritis, unspecified knee: Secondary | ICD-10-CM | POA: Diagnosis not present

## 2016-12-26 DIAGNOSIS — Z87891 Personal history of nicotine dependence: Secondary | ICD-10-CM | POA: Diagnosis not present

## 2016-12-26 DIAGNOSIS — Z6841 Body Mass Index (BMI) 40.0 and over, adult: Secondary | ICD-10-CM | POA: Diagnosis not present

## 2016-12-29 DIAGNOSIS — I2510 Atherosclerotic heart disease of native coronary artery without angina pectoris: Secondary | ICD-10-CM | POA: Diagnosis not present

## 2016-12-29 DIAGNOSIS — E7800 Pure hypercholesterolemia, unspecified: Secondary | ICD-10-CM | POA: Diagnosis not present

## 2017-01-01 ENCOUNTER — Encounter: Payer: Self-pay | Admitting: Cardiovascular Disease

## 2017-01-01 ENCOUNTER — Ambulatory Visit (INDEPENDENT_AMBULATORY_CARE_PROVIDER_SITE_OTHER): Payer: Medicare HMO | Admitting: Cardiovascular Disease

## 2017-01-01 VITALS — BP 128/76 | HR 81 | Ht 63.0 in | Wt 283.0 lb

## 2017-01-01 DIAGNOSIS — E7800 Pure hypercholesterolemia, unspecified: Secondary | ICD-10-CM

## 2017-01-01 DIAGNOSIS — I10 Essential (primary) hypertension: Secondary | ICD-10-CM | POA: Diagnosis not present

## 2017-01-01 DIAGNOSIS — I2510 Atherosclerotic heart disease of native coronary artery without angina pectoris: Secondary | ICD-10-CM

## 2017-01-01 NOTE — Patient Instructions (Signed)
Your physician wants you to follow-up in: 1 year with Dr Koneswaran You will receive a reminder letter in the mail two months in advance. If you don't receive a letter, please call our office to schedule the follow-up appointment.    Your physician recommends that you continue on your current medications as directed. Please refer to the Current Medication list given to you today.     Thank you for choosing [facility] !        

## 2017-01-01 NOTE — Progress Notes (Signed)
SUBJECTIVE: The patient presents for routine cardiovascular follow-up. She has a history of a non-STEMI and underwent drug-eluting stent placement to the left circumflex on 11/30/14 with a staged intervention to the LAD on 2/22. She also has a history of hypertension, hyperlipidemia, and tobacco abuse. Echocardiogram demonstrated normal left ventricular systolic function, EF 50-55%.  She maintains tobacco abstinence. She is dealing with seasonal allergies and a cold. She seldom has retrosternal chest pain and has not required nitroglycerin use. She seldom uses Lasix. She has chronic exertional dyspnea which has not gotten any worse. She denies palpitations and bleeding problems.   Review of Systems: As per "subjective", otherwise negative.  No Known Allergies  Current Outpatient Prescriptions  Medication Sig Dispense Refill  . albuterol (PROVENTIL HFA;VENTOLIN HFA) 108 (90 Base) MCG/ACT inhaler Inhale 1-2 puffs into the lungs every 6 (six) hours as needed for wheezing or shortness of breath.    Marland Kitchen. aspirin EC 81 MG tablet Take 1 tablet (81 mg total) by mouth daily.    Marland Kitchen. atorvastatin (LIPITOR) 40 MG tablet Take 1 tablet (40 mg total) by mouth daily. 90 tablet 3  . furosemide (LASIX) 20 MG tablet Take 20 mg daily ONLY as needed for leg swelling 60 tablet 3  . metoprolol tartrate (LOPRESSOR) 25 MG tablet Take 1 tablet (25 mg total) by mouth 2 (two) times daily. 60 tablet 6  . nitroGLYCERIN (NITROSTAT) 0.4 MG SL tablet Place 1 tablet (0.4 mg total) under the tongue every 5 (five) minutes as needed for chest pain. 25 tablet 3  . pantoprazole (PROTONIX) 20 MG tablet Take 1 tablet (20 mg total) by mouth daily. 30 tablet 6  . potassium chloride (K-DUR) 10 MEQ tablet Take 10 meq daily ONLY when you take Lasix 60 tablet 3  . ticagrelor (BRILINTA) 60 MG TABS tablet Take 1 tablet (60 mg total) by mouth 2 (two) times daily. 70 tablet 0  . traMADol (ULTRAM) 50 MG tablet Take 50 mg by mouth 2 (two)  times daily.     No current facility-administered medications for this visit.     Past Medical History:  Diagnosis Date  . CAD S/P percutaneous coronary angioplasty 11/2014   a. 11/2014 NSTEMI/PCI: LM nl, LAD 80p (3.5x18 Resolute DES), LCX 40p, 7282m (3.5x15 Resolute DES), RCA 4469m, EF 50%.  Marland Kitchen. COPD (chronic obstructive pulmonary disease) (HCC)   . Essential hypertension   . Hyperlipidemia   . Non-ST elevation (NSTEMI) myocardial infarction (HCC) 11/2014  . PONV (postoperative nausea and vomiting)   . Tobacco abuse     Past Surgical History:  Procedure Laterality Date  . ABDOMINAL HYSTERECTOMY    . CHOLECYSTECTOMY N/A 09/12/2013   Procedure: LAPAROSCOPIC CHOLECYSTECTOMY;  Surgeon: Dalia HeadingMark A Jenkins, MD;  Location: [facility];  Service: General;  Laterality: N/A;  . LEFT HEART CATHETERIZATION WITH CORONARY ANGIOGRAM N/A 11/30/2014   Procedure: LEFT HEART CATHETERIZATION WITH CORONARY ANGIOGRAM;  Surgeon: Lennette Biharihomas A Kelly, MD;  [facility];  Service: Cardiovascular;  Laterality: N/A;  . PERCUTANEOUS CORONARY STENT INTERVENTION (PCI-S) N/A 12/04/2014   Procedure: PERCUTANEOUS CORONARY STENT INTERVENTION (PCI-S);  Surgeon: Lennette Biharihomas A Kelly, MD;  [facility];  Service: Cardiovascular;  Laterality: N/A;  . TONSILLECTOMY      Social History   Social History  . Marital status: Single    Spouse name: N/A  . Number of children: N/A  . Years of education: N/A   Occupational History  . Not on file.   Social  History Main Topics  . Smoking status: Former Smoker    Packs/day: 0.50    Types: Cigarettes    Quit date: 11/29/2014  . Smokeless tobacco: Not on file  . Alcohol use No  . Drug use: No  . Sexual activity: Not on file   Other Topics Concern  . Not on file   Social History Narrative  . No narrative on file     Vitals:   01/01/17 1148  BP: 128/76  Pulse: 81  SpO2: 91%  Weight: 283 lb (128.4 kg)  Height: 5\' 3"  (1.6 m)    PHYSICAL EXAM General: NAD HEENT: Poor  dentition. Neck: Difficult to assess JVP. Lungs: Diminished but clear, no rales or wheezes. CV: Distant heart tones. Regular rate and rhythm, normal S1/S2, no S3/S4, no murmur. Trace bilateral periankle edema.  Abdomen: Soft, nontender, obese.  Neurologic: Alert and oriented x 3.  Psych: Normal affect. Skin: Normal. Musculoskeletal: No gross deformities. Extremities: No clubbing or cyanosis.     ECG: Most recent ECG reviewed.      ASSESSMENT AND PLAN:  1. CAD with h/o NSTEMI and stents to LCx and LAD: Stable ischemic heart disease. Continue aspirin, Lipitor, metoprolol, and Brilinta.  2. Essential HTN: Well controlled. No changes.  3. Hyperlipidemia: Continue high intensity statin therapy with Lipitor 40 mg. Will obtain copy of lipids from PCP.  4. Morbid obesity: Needs weight loss.   Dispo: f/u 1 year.  Prentice Docker, M.D., F.A.C.C.

## 2017-01-19 ENCOUNTER — Telehealth: Payer: Self-pay | Admitting: Cardiovascular Disease

## 2017-01-19 NOTE — Telephone Encounter (Signed)
Samples of Brilinta  / tg

## 2017-01-19 NOTE — Telephone Encounter (Signed)
Patient notified that samples are at front desk to be picked up. Pt will sign pt assistance paperwork and bring proof income.

## 2017-01-30 ENCOUNTER — Telehealth: Payer: Self-pay | Admitting: Adult Health

## 2017-01-30 NOTE — Telephone Encounter (Signed)
Samples of Brilinta . / tg

## 2017-01-30 NOTE — Telephone Encounter (Signed)
Spoke with pt. Reminded her to bring in her proof of income for pt assistance. Placed samples up front.

## 2017-02-13 ENCOUNTER — Telehealth: Payer: Self-pay | Admitting: Cardiovascular Disease

## 2017-02-13 MED ORDER — TICAGRELOR 60 MG PO TABS: 60.0000 mg | ORAL_TABLET | Freq: Two times a day (BID) | ORAL | 3 refills | Status: DC

## 2017-02-13 NOTE — Telephone Encounter (Signed)
Called Brilinta assistance program, they stated that pt needs to spend $ 363.63 more out of pocket this year before qualifying for the assistance program. Called pt, no answer, left message for pt to return call.

## 2017-02-13 NOTE — Telephone Encounter (Signed)
Patient calling regarding Brilinta and patient assistance. / tg

## 2017-02-13 NOTE — Telephone Encounter (Signed)
Spoke with pt. Let her know what the assistance program said. She voiced understanding and will call [facility] to see other options.

## 2017-02-16 ENCOUNTER — Other Ambulatory Visit: Payer: Self-pay

## 2017-02-16 MED ORDER — TICAGRELOR 60 MG PO TABS: 60.0000 mg | ORAL_TABLET | Freq: Two times a day (BID) | ORAL | 3 refills | Status: DC

## 2017-03-12 DIAGNOSIS — E7800 Pure hypercholesterolemia, unspecified: Secondary | ICD-10-CM | POA: Diagnosis not present

## 2017-03-12 DIAGNOSIS — I2510 Atherosclerotic heart disease of native coronary artery without angina pectoris: Secondary | ICD-10-CM | POA: Diagnosis not present

## 2017-03-27 DIAGNOSIS — Z299 Encounter for prophylactic measures, unspecified: Secondary | ICD-10-CM | POA: Diagnosis not present

## 2017-03-27 DIAGNOSIS — I2510 Atherosclerotic heart disease of native coronary artery without angina pectoris: Secondary | ICD-10-CM | POA: Diagnosis not present

## 2017-03-27 DIAGNOSIS — I10 Essential (primary) hypertension: Secondary | ICD-10-CM | POA: Diagnosis not present

## 2017-03-27 DIAGNOSIS — M7051 Other bursitis of knee, right knee: Secondary | ICD-10-CM | POA: Diagnosis not present

## 2017-03-27 DIAGNOSIS — E7800 Pure hypercholesterolemia, unspecified: Secondary | ICD-10-CM | POA: Diagnosis not present

## 2017-03-27 DIAGNOSIS — M1710 Unilateral primary osteoarthritis, unspecified knee: Secondary | ICD-10-CM | POA: Diagnosis not present

## 2017-03-27 DIAGNOSIS — E8881 Metabolic syndrome: Secondary | ICD-10-CM | POA: Diagnosis not present

## 2017-03-27 DIAGNOSIS — I672 Cerebral atherosclerosis: Secondary | ICD-10-CM | POA: Diagnosis not present

## 2017-03-27 DIAGNOSIS — Z6841 Body Mass Index (BMI) 40.0 and over, adult: Secondary | ICD-10-CM | POA: Diagnosis not present

## 2017-05-07 ENCOUNTER — Telehealth: Payer: Self-pay | Admitting: Adult Health

## 2017-05-07 NOTE — Telephone Encounter (Signed)
Patient has questions regarding patient assistance application. / tg  °

## 2017-05-07 NOTE — Telephone Encounter (Signed)
Returned pt call, no answer. Left message for pt to return call.  

## 2017-05-12 NOTE — Telephone Encounter (Addendum)
Patient notified that she would be given a one month supply from [employer]. Patient will call low income number at [phone]. One sample box of Brilinta placed at front desk for pick up. Lot# [id number], EXP: 5/20

## 2017-05-12 NOTE — Telephone Encounter (Signed)
Called patient. No answer. Left message to call back.  

## 2017-06-23 ENCOUNTER — Telehealth: Payer: Self-pay | Admitting: Cardiovascular Disease

## 2017-06-23 NOTE — Telephone Encounter (Signed)
Called concerning her Brilinta -would like for you to call her

## 2017-06-25 ENCOUNTER — Other Ambulatory Visit: Payer: Self-pay | Admitting: *Deleted

## 2017-06-25 MED ORDER — TICAGRELOR 60 MG PO TABS: 60.0000 mg | ORAL_TABLET | Freq: Two times a day (BID) | ORAL | 0 refills | Status: DC

## 2017-06-25 NOTE — Telephone Encounter (Signed)
Please give pt a call about her Brilinta

## 2017-06-25 NOTE — Telephone Encounter (Signed)
Returned pt call, she states that she cannot afford to get her Brilinta. We are out of samples of the 60 mg tablets at this time. The drug rep. Is mailing us some but I am not sure when they will be here. I advised the pt to bring the letter stating that she is not eligible for the LIS program. She voiced understanding. Jonita Albeeden has agreed to provide her with samples.

## 2017-07-08 ENCOUNTER — Telehealth: Payer: Self-pay | Admitting: *Deleted

## 2017-07-08 NOTE — Telephone Encounter (Signed)
Pt notified that one bottle of Brilinta has arrived in office.

## 2017-07-29 DIAGNOSIS — Z1211 Encounter for screening for malignant neoplasm of colon: Secondary | ICD-10-CM | POA: Diagnosis not present

## 2017-07-29 DIAGNOSIS — Z0000 Encounter for general adult medical examination without abnormal findings: Secondary | ICD-10-CM | POA: Diagnosis not present

## 2017-07-29 DIAGNOSIS — E7800 Pure hypercholesterolemia, unspecified: Secondary | ICD-10-CM | POA: Diagnosis not present

## 2017-07-29 DIAGNOSIS — Z7189 Other specified counseling: Secondary | ICD-10-CM | POA: Diagnosis not present

## 2017-07-29 DIAGNOSIS — R5383 Other fatigue: Secondary | ICD-10-CM | POA: Diagnosis not present

## 2017-07-29 DIAGNOSIS — Z299 Encounter for prophylactic measures, unspecified: Secondary | ICD-10-CM | POA: Diagnosis not present

## 2017-07-29 DIAGNOSIS — Z6841 Body Mass Index (BMI) 40.0 and over, adult: Secondary | ICD-10-CM | POA: Diagnosis not present

## 2017-07-29 DIAGNOSIS — Z1339 Encounter for screening examination for other mental health and behavioral disorders: Secondary | ICD-10-CM | POA: Diagnosis not present

## 2017-07-29 DIAGNOSIS — Z23 Encounter for immunization: Secondary | ICD-10-CM | POA: Diagnosis not present

## 2017-07-29 DIAGNOSIS — Z79899 Other long term (current) drug therapy: Secondary | ICD-10-CM | POA: Diagnosis not present

## 2017-07-29 DIAGNOSIS — E559 Vitamin D deficiency, unspecified: Secondary | ICD-10-CM | POA: Diagnosis not present

## 2017-07-29 DIAGNOSIS — Z1331 Encounter for screening for depression: Secondary | ICD-10-CM | POA: Diagnosis not present

## 2017-08-12 DIAGNOSIS — E7800 Pure hypercholesterolemia, unspecified: Secondary | ICD-10-CM | POA: Diagnosis not present

## 2017-08-12 DIAGNOSIS — I2510 Atherosclerotic heart disease of native coronary artery without angina pectoris: Secondary | ICD-10-CM | POA: Diagnosis not present

## 2017-08-17 ENCOUNTER — Telehealth: Payer: Self-pay | Admitting: *Deleted

## 2017-08-17 NOTE — Telephone Encounter (Signed)
Pt given two sample boxes of Brilinta

## 2017-11-10 ENCOUNTER — Other Ambulatory Visit: Payer: Self-pay | Admitting: Cardiovascular Disease

## 2017-12-08 DIAGNOSIS — Z87891 Personal history of nicotine dependence: Secondary | ICD-10-CM | POA: Diagnosis not present

## 2017-12-08 DIAGNOSIS — I10 Essential (primary) hypertension: Secondary | ICD-10-CM | POA: Diagnosis not present

## 2017-12-08 DIAGNOSIS — Z6841 Body Mass Index (BMI) 40.0 and over, adult: Secondary | ICD-10-CM | POA: Diagnosis not present

## 2017-12-08 DIAGNOSIS — Z299 Encounter for prophylactic measures, unspecified: Secondary | ICD-10-CM | POA: Diagnosis not present

## 2017-12-11 ENCOUNTER — Telehealth: Payer: Self-pay | Admitting: Physician Assistant

## 2017-12-11 NOTE — Telephone Encounter (Signed)
The patient called the answering service after-hours today. Needs refill on Brilinta, but in between the time she called us and when I returned her call, she was informed by the pharmacy they actually have an rx for her from a previous fill on file for her for the same dose so she no longer needs my assistance (is maintained on 60mg  BID).  I asked her to call us back if she has any issues obtaining this. She has appt coming up in 01/2018 with Dr. Purvis SheffieldKoneswaran. She thinks the fill that the pharmacy will provide is only for 30 days so probably needs another fill sent in, but she will check with pharmacy. I will forward to Dr. Purvis SheffieldKoneswaran and nursing staff to address further refills to bridge her until her appt, if he wants to continue med - please call patient early this week to address. The patient verbalized understanding and gratitude. Dayna Dunn PA-C

## 2017-12-14 NOTE — Telephone Encounter (Signed)
Brilinta can be discontinued at this time.

## 2017-12-14 NOTE — Telephone Encounter (Signed)
Tried to call pt. Several of the numbers listed in her chart are no longer in service. I called Rose PhiMichelle Usman's number and left a message for her to return my call. I will also mail letter.

## 2017-12-18 ENCOUNTER — Telehealth: Payer: Self-pay | Admitting: Cardiovascular Disease

## 2017-12-18 NOTE — Telephone Encounter (Signed)
Attempted to reach, got vm,[id number] 

## 2017-12-18 NOTE — Telephone Encounter (Signed)
Pt states

## 2017-12-18 NOTE — Telephone Encounter (Signed)
Laqueta LindenKoneswaran, Suresh A, MD  to Laurann Montanaunn, Dayna N, PA-C . Fonnie BirkenheadMcGhee, Lisa R, CMA     9:08 AM  Note    Brilinta can be discontinued at this time.      Patient notified she can stop Brilinta now

## 2017-12-18 NOTE — Telephone Encounter (Signed)
Please call [patient regarding questions about Brilinta/tg

## 2018-01-14 DIAGNOSIS — E7800 Pure hypercholesterolemia, unspecified: Secondary | ICD-10-CM | POA: Diagnosis not present

## 2018-01-14 DIAGNOSIS — I2510 Atherosclerotic heart disease of native coronary artery without angina pectoris: Secondary | ICD-10-CM | POA: Diagnosis not present

## 2018-01-28 ENCOUNTER — Ambulatory Visit: Payer: Medicare HMO | Admitting: Cardiovascular Disease

## 2018-01-28 ENCOUNTER — Encounter: Payer: Self-pay | Admitting: Cardiovascular Disease

## 2018-01-28 VITALS — BP 122/76 | HR 79 | Ht 63.0 in | Wt 287.0 lb

## 2018-01-28 DIAGNOSIS — I10 Essential (primary) hypertension: Secondary | ICD-10-CM | POA: Diagnosis not present

## 2018-01-28 DIAGNOSIS — E785 Hyperlipidemia, unspecified: Secondary | ICD-10-CM | POA: Diagnosis not present

## 2018-01-28 DIAGNOSIS — I25118 Atherosclerotic heart disease of native coronary artery with other forms of angina pectoris: Secondary | ICD-10-CM

## 2018-01-28 DIAGNOSIS — Z955 Presence of coronary angioplasty implant and graft: Secondary | ICD-10-CM

## 2018-01-28 NOTE — Progress Notes (Signed)
SUBJECTIVE: The patient presents for routine cardiovascular follow-up. She has a history of a non-STEMI and underwent drug-eluting stent placement to the left circumflex on 11/30/14 with a staged intervention to the LAD on 2/22. She also has a history of hypertension, hyperlipidemia, and tobacco abuse. Echocardiogram demonstrated normal left ventricular systolic function, EF 50-55%.  The patient denies any symptoms of chest pain, palpitations, shortness of breath, lightheadedness, dizziness, leg swelling, orthopnea, PND, and syncope.  ECG today shows sinus rhythm with late R wave transition.  There is low voltage.    Review of Systems: As per "subjective", otherwise negative.  No Known Allergies  Current Outpatient Medications  Medication Sig Dispense Refill  . albuterol (PROVENTIL HFA;VENTOLIN HFA) 108 (90 Base) MCG/ACT inhaler Inhale 1-2 puffs into the lungs every 6 (six) hours as needed for wheezing or shortness of breath.    Marland Kitchen. aspirin EC 81 MG tablet Take 1 tablet (81 mg total) by mouth daily.    Marland Kitchen. atorvastatin (LIPITOR) 40 MG tablet Take 1 tablet (40 mg total) by mouth daily. 90 tablet 3  . furosemide (LASIX) 20 MG tablet Take 20 mg daily ONLY as needed for leg swelling 60 tablet 3  . metoprolol tartrate (LOPRESSOR) 25 MG tablet Take 1 tablet (25 mg total) by mouth 2 (two) times daily. 60 tablet 6  . nitroGLYCERIN (NITROSTAT) 0.4 MG SL tablet Place 1 tablet (0.4 mg total) under the tongue every 5 (five) minutes as needed for chest pain. 25 tablet 3  . pantoprazole (PROTONIX) 20 MG tablet Take 1 tablet (20 mg total) by mouth daily. 30 tablet 6  . potassium chloride (K-DUR) 10 MEQ tablet Take 10 meq daily ONLY when you take Lasix 60 tablet 3  . traMADol (ULTRAM) 50 MG tablet Take 50 mg by mouth 2 (two) times daily.     No current facility-administered medications for this visit.     Past Medical History:  Diagnosis Date  . CAD S/P percutaneous coronary angioplasty 11/2014   a. 11/2014 NSTEMI/PCI: LM nl, LAD 80p (3.5x18 Resolute DES), LCX 40p, 6157m (3.5x15 Resolute DES), RCA 4030m, EF 50%.  Marland Kitchen. COPD (chronic obstructive pulmonary disease) (HCC)   . Essential hypertension   . Hyperlipidemia   . Non-ST elevation (NSTEMI) myocardial infarction (HCC) 11/2014  . PONV (postoperative nausea and vomiting)   . Tobacco abuse     Past Surgical History:  Procedure Laterality Date  . ABDOMINAL HYSTERECTOMY    . CHOLECYSTECTOMY N/A 09/12/2013   Procedure: LAPAROSCOPIC CHOLECYSTECTOMY;  Surgeon: Dalia HeadingMark A Jenkins, MD;  Location: [facility];  Service: General;  Laterality: N/A;  . LEFT HEART CATHETERIZATION WITH CORONARY ANGIOGRAM N/A 11/30/2014   Procedure: LEFT HEART CATHETERIZATION WITH CORONARY ANGIOGRAM;  Surgeon: Lennette Biharihomas A Kelly, MD;  [facility];  Service: Cardiovascular;  Laterality: N/A;  . PERCUTANEOUS CORONARY STENT INTERVENTION (PCI-S) N/A 12/04/2014   Procedure: PERCUTANEOUS CORONARY STENT INTERVENTION (PCI-S);  Surgeon: Lennette Biharihomas A Kelly, MD;  [facility];  Service: Cardiovascular;  Laterality: N/A;  . TONSILLECTOMY      Social History   Socioeconomic History  . Marital status: Single    Spouse name: Not on file  . Number of children: Not on file  . Years of education: Not on file  . Highest education level: Not on file  Occupational History  . Not on file  Social Needs  . Financial resource strain: Not on file  . Food insecurity:    Worry: Not on file  Inability: Not on file  . Transportation needs:    Medical: Not on file    Non-medical: Not on file  Tobacco Use  . Smoking status: Former Smoker    Packs/day: 0.50    Types: Cigarettes    Last attempt to quit: 11/29/2014    Years since quitting: 3.1  . Smokeless tobacco: Never Used  Substance and Sexual Activity  . Alcohol use: No    Alcohol/week: 0.0 oz  . Drug use: No  . Sexual activity: Not on file  Lifestyle  . Physical activity:    Days per week: Not on file    Minutes per  session: Not on file  . Stress: Not on file  Relationships  . Social connections:    Talks on phone: Not on file    Gets together: Not on file    Attends religious service: Not on file    Active member of club or organization: Not on file    Attends meetings of clubs or organizations: Not on file    Relationship status: Not on file  . Intimate partner violence:    Fear of current or ex partner: Not on file    Emotionally abused: Not on file    Physically abused: Not on file    Forced sexual activity: Not on file  Other Topics Concern  . Not on file  Social History Narrative  . Not on file     Vitals:   01/28/18 1250  BP: 122/76  Pulse: 79  SpO2: 96%  Weight: 287 lb (130.2 kg)  Height: 5\' 3"  (1.6 m)    Wt Readings from Last 3 Encounters:  01/28/18 287 lb (130.2 kg)  01/01/17 283 lb (128.4 kg)  12/19/15 284 lb (128.8 kg)     PHYSICAL EXAM General: NAD HEENT: Normal. Neck: No JVD, no thyromegaly. Lungs: Diminished but clear, no rales or wheezes. CV: Distant heart tones.  Regular rate and rhythm, normal S1/S2, no S3/S4, no murmur. No pretibial or periankle edema.  No carotid bruit.   Abdomen: Soft, nontender, no distention.  Neurologic: Alert and oriented.  Psych: Normal affect. Skin: Normal. Musculoskeletal: No gross deformities.    ECG: Most recent ECG reviewed.   Labs: Lab Results  Component Value Date/Time   K 4.2 12/05/2014 04:27 AM   BUN 9 12/05/2014 04:27 AM   CREATININE 0.83 12/05/2014 04:27 AM   ALT 18 11/09/2014 07:38 PM   HGB 12.3 02/12/2015 01:10 PM     Lipids: Lab Results  Component Value Date/Time   LDLCALC 95 12/01/2014 08:40 AM   CHOL 163 12/01/2014 08:40 AM   TRIG 184 (H) 12/01/2014 08:40 AM   HDL 31 (L) 12/01/2014 08:40 AM       ASSESSMENT AND PLAN:  1. CAD with h/o NSTEMI and stents to LCx and LAD: Stable ischemic heart disease. Continue aspirin, Lipitor, and metoprolol.  2. Essential HTN: Well controlled. No  changes.  3. Hyperlipidemia: Continue high intensity statin therapy with Lipitor 40 mg. Will obtain copy of lipids from PCP.  4. Morbid obesity: Needs weight loss.     Disposition: Follow up 1 year   Prentice Docker, M.D., F.A.C.C.

## 2018-01-28 NOTE — Patient Instructions (Signed)
Your physician wants you to follow-up in:  1 year with Dr.Koneswaran You will receive a reminder letter in the mail two months in advance. If you don't receive a letter, please call our office to schedule the follow-up appointment.    Your physician recommends that you continue on your current medications as directed. Please refer to the Current Medication list given to you today.    If you need a refill on your cardiac medications before your next appointment, please call your pharmacy.      No lab work or tests ordered today.      Thank you for choosing [facility] !        

## 2018-02-12 DIAGNOSIS — E7800 Pure hypercholesterolemia, unspecified: Secondary | ICD-10-CM | POA: Diagnosis not present

## 2018-02-12 DIAGNOSIS — I2510 Atherosclerotic heart disease of native coronary artery without angina pectoris: Secondary | ICD-10-CM | POA: Diagnosis not present

## 2018-04-08 DIAGNOSIS — I2510 Atherosclerotic heart disease of native coronary artery without angina pectoris: Secondary | ICD-10-CM | POA: Diagnosis not present

## 2018-04-08 DIAGNOSIS — E7800 Pure hypercholesterolemia, unspecified: Secondary | ICD-10-CM | POA: Diagnosis not present

## 2018-05-07 DIAGNOSIS — I2510 Atherosclerotic heart disease of native coronary artery without angina pectoris: Secondary | ICD-10-CM | POA: Diagnosis not present

## 2018-05-07 DIAGNOSIS — E7800 Pure hypercholesterolemia, unspecified: Secondary | ICD-10-CM | POA: Diagnosis not present

## 2018-05-11 DIAGNOSIS — Z6841 Body Mass Index (BMI) 40.0 and over, adult: Secondary | ICD-10-CM | POA: Diagnosis not present

## 2018-05-11 DIAGNOSIS — I10 Essential (primary) hypertension: Secondary | ICD-10-CM | POA: Diagnosis not present

## 2018-05-11 DIAGNOSIS — Z299 Encounter for prophylactic measures, unspecified: Secondary | ICD-10-CM | POA: Diagnosis not present

## 2018-05-11 DIAGNOSIS — I2510 Atherosclerotic heart disease of native coronary artery without angina pectoris: Secondary | ICD-10-CM | POA: Diagnosis not present

## 2018-05-11 DIAGNOSIS — J309 Allergic rhinitis, unspecified: Secondary | ICD-10-CM | POA: Diagnosis not present

## 2018-06-11 DIAGNOSIS — E7800 Pure hypercholesterolemia, unspecified: Secondary | ICD-10-CM | POA: Diagnosis not present

## 2018-06-11 DIAGNOSIS — I2510 Atherosclerotic heart disease of native coronary artery without angina pectoris: Secondary | ICD-10-CM | POA: Diagnosis not present

## 2018-08-02 DIAGNOSIS — E7800 Pure hypercholesterolemia, unspecified: Secondary | ICD-10-CM | POA: Diagnosis not present

## 2018-08-02 DIAGNOSIS — I2510 Atherosclerotic heart disease of native coronary artery without angina pectoris: Secondary | ICD-10-CM | POA: Diagnosis not present

## 2018-08-04 DIAGNOSIS — Z1331 Encounter for screening for depression: Secondary | ICD-10-CM | POA: Diagnosis not present

## 2018-08-04 DIAGNOSIS — Z299 Encounter for prophylactic measures, unspecified: Secondary | ICD-10-CM | POA: Diagnosis not present

## 2018-08-04 DIAGNOSIS — R5383 Other fatigue: Secondary | ICD-10-CM | POA: Diagnosis not present

## 2018-08-04 DIAGNOSIS — Z1339 Encounter for screening examination for other mental health and behavioral disorders: Secondary | ICD-10-CM | POA: Diagnosis not present

## 2018-08-04 DIAGNOSIS — M48061 Spinal stenosis, lumbar region without neurogenic claudication: Secondary | ICD-10-CM | POA: Diagnosis not present

## 2018-08-04 DIAGNOSIS — Z79899 Other long term (current) drug therapy: Secondary | ICD-10-CM | POA: Diagnosis not present

## 2018-08-04 DIAGNOSIS — I10 Essential (primary) hypertension: Secondary | ICD-10-CM | POA: Diagnosis not present

## 2018-08-04 DIAGNOSIS — Z0000 Encounter for general adult medical examination without abnormal findings: Secondary | ICD-10-CM | POA: Diagnosis not present

## 2018-08-04 DIAGNOSIS — E7800 Pure hypercholesterolemia, unspecified: Secondary | ICD-10-CM | POA: Diagnosis not present

## 2018-08-04 DIAGNOSIS — E559 Vitamin D deficiency, unspecified: Secondary | ICD-10-CM | POA: Diagnosis not present

## 2018-08-04 DIAGNOSIS — Z1211 Encounter for screening for malignant neoplasm of colon: Secondary | ICD-10-CM | POA: Diagnosis not present

## 2018-08-04 DIAGNOSIS — M1611 Unilateral primary osteoarthritis, right hip: Secondary | ICD-10-CM | POA: Diagnosis not present

## 2018-08-04 DIAGNOSIS — Z7189 Other specified counseling: Secondary | ICD-10-CM | POA: Diagnosis not present

## 2018-08-04 DIAGNOSIS — M47816 Spondylosis without myelopathy or radiculopathy, lumbar region: Secondary | ICD-10-CM | POA: Diagnosis not present

## 2018-08-04 DIAGNOSIS — M4316 Spondylolisthesis, lumbar region: Secondary | ICD-10-CM | POA: Diagnosis not present

## 2018-08-04 DIAGNOSIS — M25551 Pain in right hip: Secondary | ICD-10-CM | POA: Diagnosis not present

## 2018-08-04 DIAGNOSIS — I700 Atherosclerosis of aorta: Secondary | ICD-10-CM | POA: Diagnosis not present

## 2018-08-04 DIAGNOSIS — Z6841 Body Mass Index (BMI) 40.0 and over, adult: Secondary | ICD-10-CM | POA: Diagnosis not present

## 2018-08-30 DIAGNOSIS — E7800 Pure hypercholesterolemia, unspecified: Secondary | ICD-10-CM | POA: Diagnosis not present

## 2018-08-30 DIAGNOSIS — I2510 Atherosclerotic heart disease of native coronary artery without angina pectoris: Secondary | ICD-10-CM | POA: Diagnosis not present

## 2018-09-07 DIAGNOSIS — M1710 Unilateral primary osteoarthritis, unspecified knee: Secondary | ICD-10-CM | POA: Diagnosis not present

## 2018-09-07 DIAGNOSIS — Z299 Encounter for prophylactic measures, unspecified: Secondary | ICD-10-CM | POA: Diagnosis not present

## 2018-09-07 DIAGNOSIS — M1610 Unilateral primary osteoarthritis, unspecified hip: Secondary | ICD-10-CM | POA: Diagnosis not present

## 2018-09-07 DIAGNOSIS — I10 Essential (primary) hypertension: Secondary | ICD-10-CM | POA: Diagnosis not present

## 2018-09-07 DIAGNOSIS — Z6841 Body Mass Index (BMI) 40.0 and over, adult: Secondary | ICD-10-CM | POA: Diagnosis not present

## 2018-09-15 DIAGNOSIS — E2839 Other primary ovarian failure: Secondary | ICD-10-CM | POA: Diagnosis not present

## 2018-09-28 DIAGNOSIS — I2510 Atherosclerotic heart disease of native coronary artery without angina pectoris: Secondary | ICD-10-CM | POA: Diagnosis not present

## 2018-09-28 DIAGNOSIS — E7800 Pure hypercholesterolemia, unspecified: Secondary | ICD-10-CM | POA: Diagnosis not present

## 2018-12-07 DIAGNOSIS — E7800 Pure hypercholesterolemia, unspecified: Secondary | ICD-10-CM | POA: Diagnosis not present

## 2018-12-07 DIAGNOSIS — I2510 Atherosclerotic heart disease of native coronary artery without angina pectoris: Secondary | ICD-10-CM | POA: Diagnosis not present

## 2018-12-08 DIAGNOSIS — M1610 Unilateral primary osteoarthritis, unspecified hip: Secondary | ICD-10-CM | POA: Diagnosis not present

## 2018-12-08 DIAGNOSIS — Z299 Encounter for prophylactic measures, unspecified: Secondary | ICD-10-CM | POA: Diagnosis not present

## 2018-12-08 DIAGNOSIS — M1710 Unilateral primary osteoarthritis, unspecified knee: Secondary | ICD-10-CM | POA: Diagnosis not present

## 2018-12-08 DIAGNOSIS — Z6841 Body Mass Index (BMI) 40.0 and over, adult: Secondary | ICD-10-CM | POA: Diagnosis not present

## 2018-12-08 DIAGNOSIS — I10 Essential (primary) hypertension: Secondary | ICD-10-CM | POA: Diagnosis not present

## 2018-12-08 DIAGNOSIS — I2510 Atherosclerotic heart disease of native coronary artery without angina pectoris: Secondary | ICD-10-CM | POA: Diagnosis not present

## 2018-12-08 DIAGNOSIS — Z87891 Personal history of nicotine dependence: Secondary | ICD-10-CM | POA: Diagnosis not present

## 2018-12-08 DIAGNOSIS — Z2821 Immunization not carried out because of patient refusal: Secondary | ICD-10-CM | POA: Diagnosis not present

## 2019-01-20 ENCOUNTER — Ambulatory Visit: Payer: Medicare HMO | Admitting: Cardiovascular Disease

## 2019-02-02 ENCOUNTER — Telehealth: Payer: Self-pay

## 2019-02-02 NOTE — Telephone Encounter (Signed)
Medications current, pharmacy updated.

## 2019-02-03 ENCOUNTER — Encounter: Payer: Self-pay | Admitting: *Deleted

## 2019-02-03 ENCOUNTER — Telehealth (INDEPENDENT_AMBULATORY_CARE_PROVIDER_SITE_OTHER): Payer: Medicare HMO | Admitting: Cardiovascular Disease

## 2019-02-03 ENCOUNTER — Encounter: Payer: Self-pay | Admitting: Cardiovascular Disease

## 2019-02-03 VITALS — Ht 64.0 in | Wt 290.0 lb

## 2019-02-03 DIAGNOSIS — Z955 Presence of coronary angioplasty implant and graft: Secondary | ICD-10-CM

## 2019-02-03 DIAGNOSIS — I25118 Atherosclerotic heart disease of native coronary artery with other forms of angina pectoris: Secondary | ICD-10-CM | POA: Diagnosis not present

## 2019-02-03 DIAGNOSIS — I10 Essential (primary) hypertension: Secondary | ICD-10-CM

## 2019-02-03 DIAGNOSIS — E785 Hyperlipidemia, unspecified: Secondary | ICD-10-CM

## 2019-02-03 NOTE — Progress Notes (Signed)
Virtual Visit via Telephone Note   This visit type was conducted due to national recommendations for restrictions regarding the COVID-19 Pandemic (e.g. social distancing) in an effort to limit this patient's exposure and mitigate transmission in our community.  Due to her co-morbid illnesses, this patient is at least at moderate risk for complications without adequate follow up.  This format is felt to be most appropriate for this patient at this time.  The patient did not have access to video technology/had technical difficulties with video requiring transitioning to audio format only (telephone).  All issues noted in this document were discussed and addressed.  No physical exam could be performed with this format.  Please refer to the patient's chart for her  consent to telehealth for [facility].   Evaluation Performed:  Follow-up visit  Date:  02/03/2019   ID:  Monica Thompson, DOB 1951-09-15, MRN [id number]  Patient Location: Home Provider Location: Home  PCP:  Kirstie Peri, MD  Cardiologist:  Prentice Docker, MD  Electrophysiologist:  None   Chief Complaint:  CAD  History of Present Illness:    Monica Thompson is a 68 y.o. female with a history of a non-STEMI and underwent drug-eluting stent placement to the left circumflex on 11/30/14 with a staged intervention to the LAD on 2/22. She also has a history of hypertension, hyperlipidemia, and tobacco abuse. Echocardiogram demonstrated normal left ventricular systolic function, EF 50-55%.  The patient denies any symptoms of chest pain, palpitations, shortness of breath, lightheadedness, dizziness, orthopnea, PND, and syncope. She seldom has to take Lasix for b/l ankle/feet swelling. This improves with leg elevation. It is made worse when she eats chips.  The patient does not have symptoms concerning for COVID-19 infection (fever, chills, cough, or new shortness of breath).    Past Medical History:  Diagnosis Date  . CAD S/P  percutaneous coronary angioplasty 11/2014   a. 11/2014 NSTEMI/PCI: LM nl, LAD 80p (3.5x18 Resolute DES), LCX 40p, 43m (3.5x15 Resolute DES), RCA 33m, EF 50%.  Marland Kitchen COPD (chronic obstructive pulmonary disease) (HCC)   . Essential hypertension   . Hyperlipidemia   . Non-ST elevation (NSTEMI) myocardial infarction (HCC) 11/2014  . PONV (postoperative nausea and vomiting)   . Tobacco abuse    Past Surgical History:  Procedure Laterality Date  . ABDOMINAL HYSTERECTOMY    . CHOLECYSTECTOMY N/A 09/12/2013   Procedure: LAPAROSCOPIC CHOLECYSTECTOMY;  Surgeon: Dalia Heading, MD;  Location: [facility];  Service: General;  Laterality: N/A;  . LEFT HEART CATHETERIZATION WITH CORONARY ANGIOGRAM N/A 11/30/2014   Procedure: LEFT HEART CATHETERIZATION WITH CORONARY ANGIOGRAM;  Surgeon: Lennette Bihari, MD;  Location: [facility] CATH LAB;  Service: Cardiovascular;  Laterality: N/A;  . PERCUTANEOUS CORONARY STENT INTERVENTION (PCI-S) N/A 12/04/2014   Procedure: PERCUTANEOUS CORONARY STENT INTERVENTION (PCI-S);  Surgeon: Lennette Bihari, MD;  Location: [facility] CATH LAB;  Service: Cardiovascular;  Laterality: N/A;  . TONSILLECTOMY       Current Meds  Medication Sig  . albuterol (PROVENTIL HFA;VENTOLIN HFA) 108 (90 Base) MCG/ACT inhaler Inhale 1-2 puffs into the lungs every 6 (six) hours as needed for wheezing or shortness of breath.  Marland Kitchen aspirin EC 81 MG tablet Take 1 tablet (81 mg total) by mouth daily.  Marland Kitchen atorvastatin (LIPITOR) 40 MG tablet Take 1 tablet (40 mg total) by mouth daily.  . furosemide (LASIX) 20 MG tablet Take 20 mg daily ONLY as needed for leg swelling  . metoprolol tartrate (LOPRESSOR) 25 MG tablet Take 1  tablet (25 mg total) by mouth 2 (two) times daily.  . nitroGLYCERIN (NITROSTAT) 0.4 MG SL tablet Place 1 tablet (0.4 mg total) under the tongue every 5 (five) minutes as needed for chest pain.  . pantoprazole (PROTONIX) 20 MG tablet Take 1 tablet (20 mg total) by mouth daily.  . potassium chloride (K-DUR) 10 MEQ  tablet Take 10 meq daily ONLY when you take Lasix  . traMADol (ULTRAM) 50 MG tablet Take 50 mg by mouth 2 (two) times daily.     Allergies:   Patient has no known allergies.   Social History   Tobacco Use  . Smoking status: Former Smoker    Packs/day: 0.50    Types: Cigarettes    Last attempt to quit: 11/29/2014    Years since quitting: 4.1  . Smokeless tobacco: Never Used  Substance Use Topics  . Alcohol use: No    Alcohol/week: 0.0 standard drinks  . Drug use: No     Family Hx: The patient's family history includes Cancer in her father; Deep vein thrombosis in her father; Heart attack in her mother.  ROS:   Please see the history of present illness.     All other systems reviewed and are negative.   Prior CV studies:   The following studies were reviewed today:  NA  Labs/Other Tests and Data Reviewed:    EKG:  No ECG reviewed.  Recent Labs: No results found for requested labs within last 8760 hours.   Recent Lipid Panel Lab Results  Component Value Date/Time   CHOL 163 12/01/2014 08:40 AM   TRIG 184 (H) 12/01/2014 08:40 AM   HDL 31 (L) 12/01/2014 08:40 AM   CHOLHDL 5.3 12/01/2014 08:40 AM   LDLCALC 95 12/01/2014 08:40 AM    Wt Readings from Last 3 Encounters:  02/03/19 290 lb (131.5 kg)  01/28/18 287 lb (130.2 kg)  01/01/17 283 lb (128.4 kg)     Objective:    Vital Signs:  Ht 5\' 4"  (1.626 m)   Wt 290 lb (131.5 kg)   BMI 49.78 kg/m    VITAL SIGNS:  reviewed  ASSESSMENT & PLAN:    1. CAD with h/o NSTEMI and stents to LCx and LAD: Stable ischemic heart disease. Continue aspirin, Lipitor, and metoprolol.  2. Essential HTN: No changes.  3. Hyperlipidemia: Continue high intensity statin therapy with Lipitor 40 mg.Will obtain copy of lipids from PCP.  4. Morbid obesity: Needs weight loss.   COVID-19 Education: The signs and symptoms of COVID-19 were discussed with the patient and how to seek care for testing (follow up with PCP or arrange  E-visit).  The importance of social distancing was discussed today.  Time:   Today, I have spent 15 minutes with the patient with telehealth technology discussing the above problems.     Medication Adjustments/Labs and Tests Ordered: Current medicines are reviewed at length with the patient today.  Concerns regarding medicines are outlined above.   Tests Ordered: No orders of the defined types were placed in this encounter.   Medication Changes: No orders of the defined types were placed in this encounter.   Disposition:  Follow up in 1 year(s)  Signed, Prentice DockerSuresh , MD  02/03/2019 2:38 PM    [facility]

## 2019-02-04 DIAGNOSIS — E7800 Pure hypercholesterolemia, unspecified: Secondary | ICD-10-CM | POA: Diagnosis not present

## 2019-02-04 DIAGNOSIS — I2510 Atherosclerotic heart disease of native coronary artery without angina pectoris: Secondary | ICD-10-CM | POA: Diagnosis not present

## 2019-03-04 DIAGNOSIS — E7800 Pure hypercholesterolemia, unspecified: Secondary | ICD-10-CM | POA: Diagnosis not present

## 2019-03-04 DIAGNOSIS — I2510 Atherosclerotic heart disease of native coronary artery without angina pectoris: Secondary | ICD-10-CM | POA: Diagnosis not present

## 2019-04-14 DIAGNOSIS — E7800 Pure hypercholesterolemia, unspecified: Secondary | ICD-10-CM | POA: Diagnosis not present

## 2019-04-14 DIAGNOSIS — I2510 Atherosclerotic heart disease of native coronary artery without angina pectoris: Secondary | ICD-10-CM | POA: Diagnosis not present

## 2019-06-07 DIAGNOSIS — E7800 Pure hypercholesterolemia, unspecified: Secondary | ICD-10-CM | POA: Diagnosis not present

## 2019-06-07 DIAGNOSIS — I2510 Atherosclerotic heart disease of native coronary artery without angina pectoris: Secondary | ICD-10-CM | POA: Diagnosis not present

## 2019-07-22 DIAGNOSIS — I2510 Atherosclerotic heart disease of native coronary artery without angina pectoris: Secondary | ICD-10-CM | POA: Diagnosis not present

## 2019-07-22 DIAGNOSIS — E7800 Pure hypercholesterolemia, unspecified: Secondary | ICD-10-CM | POA: Diagnosis not present

## 2019-10-04 DIAGNOSIS — Z299 Encounter for prophylactic measures, unspecified: Secondary | ICD-10-CM | POA: Diagnosis not present

## 2019-10-04 DIAGNOSIS — I2510 Atherosclerotic heart disease of native coronary artery without angina pectoris: Secondary | ICD-10-CM | POA: Diagnosis not present

## 2019-10-04 DIAGNOSIS — I10 Essential (primary) hypertension: Secondary | ICD-10-CM | POA: Diagnosis not present

## 2019-10-04 DIAGNOSIS — Z6841 Body Mass Index (BMI) 40.0 and over, adult: Secondary | ICD-10-CM | POA: Diagnosis not present

## 2019-11-30 DIAGNOSIS — I2510 Atherosclerotic heart disease of native coronary artery without angina pectoris: Secondary | ICD-10-CM | POA: Diagnosis not present

## 2019-11-30 DIAGNOSIS — E7800 Pure hypercholesterolemia, unspecified: Secondary | ICD-10-CM | POA: Diagnosis not present

## 2020-01-31 DIAGNOSIS — I2510 Atherosclerotic heart disease of native coronary artery without angina pectoris: Secondary | ICD-10-CM | POA: Diagnosis not present

## 2020-01-31 DIAGNOSIS — E7800 Pure hypercholesterolemia, unspecified: Secondary | ICD-10-CM | POA: Diagnosis not present

## 2020-03-11 DIAGNOSIS — I2510 Atherosclerotic heart disease of native coronary artery without angina pectoris: Secondary | ICD-10-CM | POA: Diagnosis not present

## 2020-03-11 DIAGNOSIS — E7800 Pure hypercholesterolemia, unspecified: Secondary | ICD-10-CM | POA: Diagnosis not present

## 2020-05-11 DIAGNOSIS — I2510 Atherosclerotic heart disease of native coronary artery without angina pectoris: Secondary | ICD-10-CM | POA: Diagnosis not present

## 2020-05-11 DIAGNOSIS — E7800 Pure hypercholesterolemia, unspecified: Secondary | ICD-10-CM | POA: Diagnosis not present

## 2020-05-18 DIAGNOSIS — I10 Essential (primary) hypertension: Secondary | ICD-10-CM | POA: Diagnosis not present

## 2020-05-18 DIAGNOSIS — Z299 Encounter for prophylactic measures, unspecified: Secondary | ICD-10-CM | POA: Diagnosis not present

## 2020-05-18 DIAGNOSIS — E7800 Pure hypercholesterolemia, unspecified: Secondary | ICD-10-CM | POA: Diagnosis not present

## 2020-06-21 DIAGNOSIS — Z299 Encounter for prophylactic measures, unspecified: Secondary | ICD-10-CM | POA: Diagnosis not present

## 2020-06-21 DIAGNOSIS — E7800 Pure hypercholesterolemia, unspecified: Secondary | ICD-10-CM | POA: Diagnosis not present

## 2020-06-21 DIAGNOSIS — Z0000 Encounter for general adult medical examination without abnormal findings: Secondary | ICD-10-CM | POA: Diagnosis not present

## 2020-06-21 DIAGNOSIS — Z7189 Other specified counseling: Secondary | ICD-10-CM | POA: Diagnosis not present

## 2020-06-21 DIAGNOSIS — R5383 Other fatigue: Secondary | ICD-10-CM | POA: Diagnosis not present

## 2020-06-21 DIAGNOSIS — Z1331 Encounter for screening for depression: Secondary | ICD-10-CM | POA: Diagnosis not present

## 2020-06-21 DIAGNOSIS — Z6841 Body Mass Index (BMI) 40.0 and over, adult: Secondary | ICD-10-CM | POA: Diagnosis not present

## 2020-06-21 DIAGNOSIS — Z1339 Encounter for screening examination for other mental health and behavioral disorders: Secondary | ICD-10-CM | POA: Diagnosis not present

## 2020-06-21 DIAGNOSIS — E559 Vitamin D deficiency, unspecified: Secondary | ICD-10-CM | POA: Diagnosis not present

## 2020-07-12 DIAGNOSIS — I2510 Atherosclerotic heart disease of native coronary artery without angina pectoris: Secondary | ICD-10-CM | POA: Diagnosis not present

## 2020-07-12 DIAGNOSIS — E7800 Pure hypercholesterolemia, unspecified: Secondary | ICD-10-CM | POA: Diagnosis not present

## 2020-08-10 DIAGNOSIS — E7800 Pure hypercholesterolemia, unspecified: Secondary | ICD-10-CM | POA: Diagnosis not present

## 2020-08-10 DIAGNOSIS — I2510 Atherosclerotic heart disease of native coronary artery without angina pectoris: Secondary | ICD-10-CM | POA: Diagnosis not present

## 2020-09-11 DIAGNOSIS — I2510 Atherosclerotic heart disease of native coronary artery without angina pectoris: Secondary | ICD-10-CM | POA: Diagnosis not present

## 2020-09-11 DIAGNOSIS — E7800 Pure hypercholesterolemia, unspecified: Secondary | ICD-10-CM | POA: Diagnosis not present

## 2020-09-14 ENCOUNTER — Other Ambulatory Visit: Payer: Self-pay | Admitting: Internal Medicine

## 2020-09-14 DIAGNOSIS — Z1231 Encounter for screening mammogram for malignant neoplasm of breast: Secondary | ICD-10-CM

## 2020-10-11 DIAGNOSIS — I2510 Atherosclerotic heart disease of native coronary artery without angina pectoris: Secondary | ICD-10-CM | POA: Diagnosis not present

## 2020-10-11 DIAGNOSIS — E7800 Pure hypercholesterolemia, unspecified: Secondary | ICD-10-CM | POA: Diagnosis not present

## 2023-11-22 ENCOUNTER — Observation Stay (HOSPITAL_BASED_OUTPATIENT_CLINIC_OR_DEPARTMENT_OTHER): Payer: Medicare HMO

## 2023-11-22 ENCOUNTER — Observation Stay (HOSPITAL_COMMUNITY)
Admission: EM | Admit: 2023-11-22 | Discharge: 2023-11-27 | Disposition: A | Discharge status: Skilled Nursing Facility | Payer: Medicare HMO | Attending: Internal Medicine | Admitting: Internal Medicine

## 2023-11-22 ENCOUNTER — Emergency Department (HOSPITAL_COMMUNITY): Payer: Medicare HMO

## 2023-11-22 ENCOUNTER — Encounter (HOSPITAL_COMMUNITY): Payer: Self-pay

## 2023-11-22 ENCOUNTER — Other Ambulatory Visit: Payer: Self-pay

## 2023-11-22 DIAGNOSIS — E1140 Type 2 diabetes mellitus with diabetic neuropathy, unspecified: Secondary | ICD-10-CM | POA: Insufficient documentation

## 2023-11-22 DIAGNOSIS — E119 Type 2 diabetes mellitus without complications: Secondary | ICD-10-CM | POA: Insufficient documentation

## 2023-11-22 DIAGNOSIS — Z794 Long term (current) use of insulin: Secondary | ICD-10-CM | POA: Diagnosis not present

## 2023-11-22 DIAGNOSIS — I700 Atherosclerosis of aorta: Secondary | ICD-10-CM | POA: Diagnosis not present

## 2023-11-22 DIAGNOSIS — I517 Cardiomegaly: Secondary | ICD-10-CM | POA: Diagnosis not present

## 2023-11-22 DIAGNOSIS — N182 Chronic kidney disease, stage 2 (mild): Secondary | ICD-10-CM | POA: Insufficient documentation

## 2023-11-22 DIAGNOSIS — R55 Syncope and collapse: Secondary | ICD-10-CM | POA: Insufficient documentation

## 2023-11-22 DIAGNOSIS — R0602 Shortness of breath: Secondary | ICD-10-CM | POA: Diagnosis not present

## 2023-11-22 DIAGNOSIS — I129 Hypertensive chronic kidney disease with stage 1 through stage 4 chronic kidney disease, or unspecified chronic kidney disease: Secondary | ICD-10-CM | POA: Insufficient documentation

## 2023-11-22 DIAGNOSIS — I471 Supraventricular tachycardia, unspecified: Secondary | ICD-10-CM | POA: Diagnosis not present

## 2023-11-22 DIAGNOSIS — Z1152 Encounter for screening for COVID-19: Secondary | ICD-10-CM | POA: Insufficient documentation

## 2023-11-22 DIAGNOSIS — Z9861 Coronary angioplasty status: Secondary | ICD-10-CM | POA: Diagnosis not present

## 2023-11-22 DIAGNOSIS — R739 Hyperglycemia, unspecified: Secondary | ICD-10-CM | POA: Diagnosis not present

## 2023-11-22 DIAGNOSIS — I252 Old myocardial infarction: Secondary | ICD-10-CM | POA: Diagnosis not present

## 2023-11-22 DIAGNOSIS — Z8249 Family history of ischemic heart disease and other diseases of the circulatory system: Secondary | ICD-10-CM | POA: Insufficient documentation

## 2023-11-22 DIAGNOSIS — E1165 Type 2 diabetes mellitus with hyperglycemia: Secondary | ICD-10-CM | POA: Insufficient documentation

## 2023-11-22 DIAGNOSIS — R000 Tachycardia, unspecified: Principal | ICD-10-CM

## 2023-11-22 DIAGNOSIS — E1122 Type 2 diabetes mellitus with diabetic chronic kidney disease: Secondary | ICD-10-CM | POA: Diagnosis not present

## 2023-11-22 DIAGNOSIS — J438 Other emphysema: Secondary | ICD-10-CM

## 2023-11-22 DIAGNOSIS — E785 Hyperlipidemia, unspecified: Secondary | ICD-10-CM | POA: Diagnosis not present

## 2023-11-22 DIAGNOSIS — R079 Chest pain, unspecified: Secondary | ICD-10-CM

## 2023-11-22 DIAGNOSIS — Z955 Presence of coronary angioplasty implant and graft: Secondary | ICD-10-CM | POA: Insufficient documentation

## 2023-11-22 DIAGNOSIS — Z79899 Other long term (current) drug therapy: Secondary | ICD-10-CM | POA: Insufficient documentation

## 2023-11-22 DIAGNOSIS — I499 Cardiac arrhythmia, unspecified: Secondary | ICD-10-CM | POA: Diagnosis not present

## 2023-11-22 DIAGNOSIS — E66813 Obesity, class 3: Secondary | ICD-10-CM | POA: Insufficient documentation

## 2023-11-22 DIAGNOSIS — I4891 Unspecified atrial fibrillation: Secondary | ICD-10-CM | POA: Insufficient documentation

## 2023-11-22 DIAGNOSIS — I2510 Atherosclerotic heart disease of native coronary artery without angina pectoris: Secondary | ICD-10-CM | POA: Insufficient documentation

## 2023-11-22 DIAGNOSIS — Z6841 Body Mass Index (BMI) 40.0 and over, adult: Secondary | ICD-10-CM | POA: Diagnosis not present

## 2023-11-22 DIAGNOSIS — Z7984 Long term (current) use of oral hypoglycemic drugs: Secondary | ICD-10-CM | POA: Insufficient documentation

## 2023-11-22 DIAGNOSIS — R0789 Other chest pain: Secondary | ICD-10-CM | POA: Diagnosis not present

## 2023-11-22 DIAGNOSIS — Z87891 Personal history of nicotine dependence: Secondary | ICD-10-CM | POA: Diagnosis not present

## 2023-11-22 DIAGNOSIS — I959 Hypotension, unspecified: Secondary | ICD-10-CM | POA: Diagnosis not present

## 2023-11-22 DIAGNOSIS — E1142 Type 2 diabetes mellitus with diabetic polyneuropathy: Secondary | ICD-10-CM | POA: Diagnosis not present

## 2023-11-22 DIAGNOSIS — R110 Nausea: Secondary | ICD-10-CM | POA: Insufficient documentation

## 2023-11-22 DIAGNOSIS — E1151 Type 2 diabetes mellitus with diabetic peripheral angiopathy without gangrene: Secondary | ICD-10-CM | POA: Insufficient documentation

## 2023-11-22 DIAGNOSIS — R5381 Other malaise: Secondary | ICD-10-CM | POA: Diagnosis not present

## 2023-11-22 DIAGNOSIS — Z743 Need for continuous supervision: Secondary | ICD-10-CM | POA: Diagnosis not present

## 2023-11-22 DIAGNOSIS — I739 Peripheral vascular disease, unspecified: Secondary | ICD-10-CM

## 2023-11-22 DIAGNOSIS — J449 Chronic obstructive pulmonary disease, unspecified: Secondary | ICD-10-CM | POA: Diagnosis not present

## 2023-11-22 DIAGNOSIS — Z7982 Long term (current) use of aspirin: Secondary | ICD-10-CM | POA: Diagnosis not present

## 2023-11-22 DIAGNOSIS — N189 Chronic kidney disease, unspecified: Secondary | ICD-10-CM | POA: Insufficient documentation

## 2023-11-22 DIAGNOSIS — I469 Cardiac arrest, cause unspecified: Secondary | ICD-10-CM | POA: Diagnosis not present

## 2023-11-22 LAB — CBC WITH DIFFERENTIAL/PLATELET
Abs Immature Granulocytes: 0.03 10*3/uL (ref 0.00–0.07)
Basophils Absolute: 0 10*3/uL (ref 0.0–0.1)
Basophils Relative: 0 %
Eosinophils Absolute: 0.1 10*3/uL (ref 0.0–0.5)
Eosinophils Relative: 1 %
HCT: 46.8 % — ABNORMAL HIGH (ref 36.0–46.0)
Hemoglobin: 15.4 g/dL — ABNORMAL HIGH (ref 12.0–15.0)
Immature Granulocytes: 0 %
Lymphocytes Relative: 23 %
Lymphs Abs: 2 10*3/uL (ref 0.7–4.0)
MCH: 29.3 pg (ref 26.0–34.0)
MCHC: 32.9 g/dL (ref 30.0–36.0)
MCV: 89.1 fL (ref 80.0–100.0)
Monocytes Absolute: 0.5 10*3/uL (ref 0.1–1.0)
Monocytes Relative: 6 %
Neutro Abs: 6.2 10*3/uL (ref 1.7–7.7)
Neutrophils Relative %: 70 %
Platelets: 341 10*3/uL (ref 150–400)
RBC: 5.25 MIL/uL — ABNORMAL HIGH (ref 3.87–5.11)
RDW: 13.2 % (ref 11.5–15.5)
WBC: 8.8 10*3/uL (ref 4.0–10.5)
nRBC: 0 % (ref 0.0–0.2)

## 2023-11-22 LAB — ECHOCARDIOGRAM COMPLETE
Area-P 1/2: 2.66 cm2
Calc EF: 64 %
S' Lateral: 3.1 cm
Single Plane A2C EF: 62.7 %
Single Plane A4C EF: 65.5 %

## 2023-11-22 LAB — BASIC METABOLIC PANEL
Anion gap: 12 (ref 5–15)
BUN: 23 mg/dL (ref 8–23)
CO2: 22 mmol/L (ref 22–32)
Calcium: 9.2 mg/dL (ref 8.9–10.3)
Chloride: 103 mmol/L (ref 98–111)
Creatinine, Ser: 1 mg/dL (ref 0.44–1.00)
GFR, Estimated: 59 mL/min — ABNORMAL LOW (ref >60)
Glucose, Bld: 403 mg/dL — ABNORMAL HIGH (ref 70–99)
Potassium: 4.3 mmol/L (ref 3.5–5.1)
Sodium: 137 mmol/L (ref 135–145)

## 2023-11-22 LAB — TROPONIN I (HIGH SENSITIVITY)
Troponin I (High Sensitivity): 10 ng/L (ref <18)
Troponin I (High Sensitivity): 13 ng/L (ref <18)

## 2023-11-22 LAB — HEMOGLOBIN A1C
Hgb A1c MFr Bld: 10.4 % — ABNORMAL HIGH (ref 4.8–5.6)
Mean Plasma Glucose: 251.78 mg/dL

## 2023-11-22 LAB — CBG MONITORING, ED
Glucose-Capillary: 305 mg/dL — ABNORMAL HIGH (ref 70–99)
Glucose-Capillary: 228 mg/dL — ABNORMAL HIGH (ref 70–99)
Glucose-Capillary: 190 mg/dL — ABNORMAL HIGH (ref 70–99)
Glucose-Capillary: 206 mg/dL — ABNORMAL HIGH (ref 70–99)

## 2023-11-22 LAB — TSH: TSH: 2.884 u[IU]/mL (ref 0.350–4.500)

## 2023-11-22 LAB — RESP PANEL BY RT-PCR (RSV, FLU A&B, COVID)  RVPGX2
Influenza A by PCR: NEGATIVE
Influenza B by PCR: NEGATIVE
Resp Syncytial Virus by PCR: NEGATIVE
SARS Coronavirus 2 by RT PCR: NEGATIVE

## 2023-11-22 LAB — BRAIN NATRIURETIC PEPTIDE: B Natriuretic Peptide: 76.3 pg/mL (ref 0.0–100.0)

## 2023-11-22 MED ORDER — METFORMIN HCL 850 MG PO TABS: 850.0000 mg | ORAL_TABLET | Freq: Once | ORAL | Status: DC

## 2023-11-22 MED ORDER — ASPIRIN 81 MG PO TBEC
81.0000 mg | DELAYED_RELEASE_TABLET | Freq: Every day | ORAL | Status: DC
Start: 2023-11-22 — End: 2023-11-27
  Administered 2023-11-22 – 2023-11-27 (×6): 81 mg via ORAL
  Filled 2023-11-22 (×6): qty 1

## 2023-11-22 MED ORDER — HEPARIN SODIUM (PORCINE) 5000 UNIT/ML IJ SOLN
5000.0000 [IU] | Freq: Three times a day (TID) | INTRAMUSCULAR | Status: DC
Start: 2023-11-22 — End: 2023-11-27
  Administered 2023-11-22 – 2023-11-27 (×16): 5000 [IU] via SUBCUTANEOUS
  Filled 2023-11-22 (×16): qty 1

## 2023-11-22 MED ORDER — PERFLUTREN LIPID MICROSPHERE
1.0000 mL | INTRAVENOUS | Status: AC | PRN
  Administered 2023-11-22: 4 mL via INTRAVENOUS

## 2023-11-22 MED ORDER — ONDANSETRON HCL 4 MG PO TABS: 4.0000 mg | ORAL_TABLET | Freq: Four times a day (QID) | ORAL | Status: DC | PRN

## 2023-11-22 MED ORDER — INSULIN ASPART 100 UNIT/ML IJ SOLN
0.0000 [IU] | Freq: Every day | INTRAMUSCULAR | Status: DC
  Administered 2023-11-22: 2 [IU] via SUBCUTANEOUS
  Administered 2023-11-23: 3 [IU] via SUBCUTANEOUS
  Administered 2023-11-24: 5 [IU] via SUBCUTANEOUS
  Administered 2023-11-25: 3 [IU] via SUBCUTANEOUS

## 2023-11-22 MED ORDER — ACETAMINOPHEN 650 MG RE SUPP: 650.0000 mg | Freq: Four times a day (QID) | RECTAL | Status: DC | PRN

## 2023-11-22 MED ORDER — ACETAMINOPHEN 325 MG PO TABS
650.0000 mg | ORAL_TABLET | Freq: Four times a day (QID) | ORAL | Status: DC | PRN
  Administered 2023-11-22 – 2023-11-26 (×11): 650 mg via ORAL
  Filled 2023-11-22 (×9): qty 2

## 2023-11-22 MED ORDER — ONDANSETRON HCL 4 MG/2ML IJ SOLN
4.0000 mg | Freq: Four times a day (QID) | INTRAMUSCULAR | Status: DC | PRN
  Administered 2023-11-22 – 2023-11-26 (×2): 4 mg via INTRAVENOUS
  Filled 2023-11-22 (×2): qty 2

## 2023-11-22 MED ORDER — INSULIN ASPART 100 UNIT/ML IJ SOLN
0.0000 [IU] | Freq: Three times a day (TID) | INTRAMUSCULAR | Status: DC
  Administered 2023-11-22: 4 [IU] via SUBCUTANEOUS
  Administered 2023-11-22: 7 [IU] via SUBCUTANEOUS
  Administered 2023-11-22: 15 [IU] via SUBCUTANEOUS
  Administered 2023-11-23: 11 [IU] via SUBCUTANEOUS
  Administered 2023-11-23 – 2023-11-24 (×2): 7 [IU] via SUBCUTANEOUS
  Administered 2023-11-24: 11 [IU] via SUBCUTANEOUS
  Administered 2023-11-24 – 2023-11-26 (×5): 7 [IU] via SUBCUTANEOUS
  Administered 2023-11-26 (×2): 11 [IU] via SUBCUTANEOUS
  Administered 2023-11-27: 7 [IU] via SUBCUTANEOUS

## 2023-11-22 MED ORDER — ASPIRIN 81 MG PO CHEW
324.0000 mg | CHEWABLE_TABLET | Freq: Once | ORAL | Status: AC
  Administered 2023-11-22: 324 mg via ORAL
  Filled 2023-11-22: qty 4

## 2023-11-22 MED ORDER — INSULIN ASPART 100 UNIT/ML IJ SOLN
6.0000 [IU] | Freq: Once | INTRAMUSCULAR | Status: AC
  Administered 2023-11-22: 6 [IU] via SUBCUTANEOUS

## 2023-11-22 NOTE — ED Notes (Signed)
Patient provided sandwich and water.

## 2023-11-22 NOTE — Care Management Obs Status (Signed)
 MEDICARE OBSERVATION STATUS NOTIFICATION   Patient Details  Name: Monica Thompson MRN: [id number] Date of Birth: [birth year]   Medicare Observation Status Notification Given:  Yes    Ronni Colace, RN 11/22/2023, 9:37 AM

## 2023-11-22 NOTE — Assessment & Plan Note (Signed)
 Pt takes no regular medications. Start ASA 81 mg daily

## 2023-11-22 NOTE — ED Provider Notes (Signed)
 [place] EMERGENCY DEPARTMENT AT [facility] Provider Note   CSN: [id number] Arrival date & time: 11/22/23  0203     History  Chief Complaint  Patient presents with   Tachycardia    Monica Thompson is a 73 y.o. female.  The history is provided by the patient.  Chest Pain Pain location:  Substernal area Pain quality: dull   Pain severity:  Moderate Onset quality:  Sudden Timing:  Constant Progression:  Partially resolved Chronicity:  New Context: at rest   Relieved by:  Nothing Worsened by:  Nothing Ineffective treatments: vagal maneuvers as patient reportedly had a HR in the 180s. Associated symptoms: shortness of breath   Associated symptoms: no fever and no nausea   Risk factors: coronary artery disease and diabetes mellitus   Risk factors comment:  Has stents but has not seen a doctor since stents Patient with h/o NSTEMI who is not on any medications and has not seen a doctor since her stents 10 years ago presents with increased HR to 180s, CP and SOB that started at rest.      Past Medical History:  Diagnosis Date   CAD S/P percutaneous coronary angioplasty 11/13/2014   a. 11/2014 NSTEMI/PCI: LM nl, LAD 80p (3.5x18 Resolute DES), LCX 40p, 30m (3.5x15 Resolute DES), RCA 18m, EF 50%.   Cholecystitis, acute with cholelithiasis 09/10/2013   COPD (chronic obstructive pulmonary disease) (HCC)    Essential hypertension    Hyperlipidemia    Non-ST elevation (NSTEMI) myocardial infarction (HCC) 11/13/2014   NSTEMI (non-ST elevated myocardial infarction) (HCC) 11/30/2014   NSTEMI, initial episode of care (HCC) 11/30/2014   PONV (postoperative nausea and vomiting)    Tobacco abuse      Home Medications Prior to Admission medications   Medication Sig Start Date End Date Taking? Authorizing Provider  albuterol  (PROVENTIL  HFA;VENTOLIN  HFA) 108 (90 Base) MCG/ACT inhaler Inhale 1-2 puffs into the lungs every 6 (six) hours as needed for wheezing or shortness of  breath.    [provider]  aspirin  EC 81 MG tablet Take 1 tablet (81 mg total) by mouth daily. 12/05/14   Vivienne Lonni Ingle, NP  atorvastatin  (LIPITOR ) 40 MG tablet Take 1 tablet (40 mg total) by mouth daily. 06/05/15   Charls Pearla LABOR, MD  furosemide  (LASIX ) 20 MG tablet Take 20 mg daily ONLY as needed for leg swelling 06/14/15   Charls Pearla LABOR, MD  metoprolol  tartrate (LOPRESSOR ) 25 MG tablet Take 1 tablet (25 mg total) by mouth 2 (two) times daily. 06/04/15   Charls Pearla LABOR, MD  nitroGLYCERIN  (NITROSTAT ) 0.4 MG SL tablet Place 1 tablet (0.4 mg total) under the tongue every 5 (five) minutes as needed for chest pain. 12/05/14   Vivienne Lonni Ingle, NP  pantoprazole  (PROTONIX ) 20 MG tablet Take 1 tablet (20 mg total) by mouth daily. 06/04/15   Charls Pearla LABOR, MD  potassium chloride  (K-DUR) 10 MEQ tablet Take 10 meq daily ONLY when you take Lasix  06/14/15   Charls Pearla LABOR, MD  traMADol  (ULTRAM ) 50 MG tablet Take 50 mg by mouth 2 (two) times daily.    [provider]      Allergies    Patient has no known allergies.    Review of Systems   Review of Systems  Constitutional:  Negative for fever.  Eyes:  Negative for photophobia.  Respiratory:  Positive for shortness of breath.   Cardiovascular:  Positive for chest pain.  Gastrointestinal:  Negative for nausea.  All other systems reviewed and are negative.   Physical Exam Updated Vital Signs BP 122/70   Pulse 94   Temp 97.7 F (36.5 C) (Oral)   Resp 19   SpO2 93%  Physical Exam Vitals and nursing note reviewed.  Constitutional:      General: She is not in acute distress.    Appearance: Normal appearance. She is well-developed.  HENT:     Head: Normocephalic and atraumatic.     Nose: Nose normal.  Eyes:     Pupils: Pupils are equal, round, and reactive to light.  Cardiovascular:     Rate and Rhythm: Normal rate and regular rhythm.     Pulses: Normal pulses.     Heart sounds:  Normal heart sounds.  Pulmonary:     Effort: Pulmonary effort is normal. No respiratory distress.     Breath sounds: Rhonchi present. No rales.  Abdominal:     General: Bowel sounds are normal. There is no distension.     Palpations: Abdomen is soft.     Tenderness: There is no abdominal tenderness. There is no guarding or rebound.  Musculoskeletal:        General: Normal range of motion.     Cervical back: Normal range of motion and neck supple.  Skin:    General: Skin is warm and dry.     Capillary Refill: Capillary refill takes less than 2 seconds.     Findings: No erythema or rash.  Neurological:     General: No focal deficit present.     Mental Status: She is alert.     Deep Tendon Reflexes: Reflexes normal.  Psychiatric:        Mood and Affect: Mood normal.     ED Results / Procedures / Treatments   Labs (all labs ordered are listed, but only abnormal results are displayed) Results for orders placed or performed during the hospital encounter of 11/22/23  CBC with Differential   Collection Time: 11/22/23  2:12 AM  Result Value Ref Range   WBC 8.8 4.0 - 10.5 K/uL   RBC 5.25 (H) 3.87 - 5.11 MIL/uL   Hemoglobin 15.4 (H) 12.0 - 15.0 g/dL   HCT 53.1 (H) 63.9 - 53.9 %   MCV 89.1 80.0 - 100.0 fL   MCH 29.3 26.0 - 34.0 pg   MCHC 32.9 30.0 - 36.0 g/dL   RDW 86.7 88.4 - 84.4 %   Platelets 341 150 - 400 K/uL   nRBC 0.0 0.0 - 0.2 %   Neutrophils Relative % 70 %   Neutro Abs 6.2 1.7 - 7.7 K/uL   Lymphocytes Relative 23 %   Lymphs Abs 2.0 0.7 - 4.0 K/uL   Monocytes Relative 6 %   Monocytes Absolute 0.5 0.1 - 1.0 K/uL   Eosinophils Relative 1 %   Eosinophils Absolute 0.1 0.0 - 0.5 K/uL   Basophils Relative 0 %   Basophils Absolute 0.0 0.0 - 0.1 K/uL   Immature Granulocytes 0 %   Abs Immature Granulocytes 0.03 0.00 - 0.07 K/uL  Basic metabolic panel   Collection Time: 11/22/23  2:12 AM  Result Value Ref Range   Sodium 137 135 - 145 mmol/L   Potassium 4.3 3.5 - 5.1 mmol/L    Chloride 103 98 - 111 mmol/L   CO2 22 22 - 32 mmol/L   Glucose, Bld 403 (H) 70 - 99 mg/dL   BUN 23 8 - 23 mg/dL   Creatinine, Ser 8.99 0.44 - 1.00  mg/dL   Calcium  9.2 8.9 - 10.3 mg/dL   GFR, Estimated 59 (L) >60 mL/min   Anion gap 12 5 - 15  Brain natriuretic peptide   Collection Time: 11/22/23  2:12 AM  Result Value Ref Range   B Natriuretic Peptide 76.3 0.0 - 100.0 pg/mL  Troponin I (High Sensitivity)   Collection Time: 11/22/23  2:12 AM  Result Value Ref Range   Troponin I (High Sensitivity) 10 <18 ng/L  Resp panel by RT-PCR (RSV, Flu A&B, Covid) Anterior Nasal Swab   Collection Time: 11/22/23  2:15 AM   Specimen: Anterior Nasal Swab  Result Value Ref Range   SARS Coronavirus 2 by RT PCR NEGATIVE NEGATIVE   Influenza A by PCR NEGATIVE NEGATIVE   Influenza B by PCR NEGATIVE NEGATIVE   Resp Syncytial Virus by PCR NEGATIVE NEGATIVE  Troponin I (High Sensitivity)   Collection Time: 11/22/23  3:58 AM  Result Value Ref Range   Troponin I (High Sensitivity) 13 <18 ng/L   DG Chest Portable 1 View Result Date: 11/22/2023 CLINICAL DATA:  Shortness of breath. EXAM: PORTABLE CHEST 1 VIEW COMPARISON:  Chest 11/30/2014 FINDINGS: There is mild cardiomegaly. Central vessels are normal in caliber. The mediastinum is normally outlined. There is calcification of the transverse aorta. The lungs are clear. Moderate thoracic spondylosis. Compare: Unchanged. IMPRESSION: No evidence of acute chest disease. Mild cardiomegaly. Aortic atherosclerosis. Electronically Signed   By: Francis Quam M.D.   On: 11/22/2023 04:16    EKG EKG Interpretation Date/Time:  Sunday November 22 2023 02:10:20 EST Ventricular Rate:  111 PR Interval:    QRS Duration:  94 QT Interval:  345 QTC Calculation: 469 R Axis:   4  Text Interpretation: Sinus tachycardia Low voltage, precordial leads Consider anterior infarct Confirmed by Nettie, Malak Duchesneau ([id number]) on 11/22/2023 4:01:35 AM  Radiology DG Chest Portable 1  View Result Date: 11/22/2023 CLINICAL DATA:  Shortness of breath. EXAM: PORTABLE CHEST 1 VIEW COMPARISON:  Chest 11/30/2014 FINDINGS: There is mild cardiomegaly. Central vessels are normal in caliber. The mediastinum is normally outlined. There is calcification of the transverse aorta. The lungs are clear. Moderate thoracic spondylosis. Compare: Unchanged. IMPRESSION: No evidence of acute chest disease. Mild cardiomegaly. Aortic atherosclerosis. Electronically Signed   By: Francis Quam M.D.   On: 11/22/2023 04:16    Procedures Procedures    Medications Ordered in ED Medications  aspirin  chewable tablet 324 mg (324 mg Oral Given 11/22/23 0417)  insulin  aspart (novoLOG ) injection 6 Units (6 Units Subcutaneous Given 11/22/23 9587)    ED Course/ Medical Decision Making/ A&P                                 Medical Decision Making SOB and tachycardia   Amount and/or Complexity of Data Reviewed Independent Historian: EMS    Details: See above  External Data Reviewed: notes.    Details: Previous notes reviewed  Labs: ordered.    Details: First troponin is 10, negative covid and flu, normal white count 8.8, normal hemoglobin and platelet count, BNP 76.3, normal sodium 137, normal potassium markedly elevated glucose 403  Radiology: ordered and independent interpretation performed.    Details: Cephalization by me  ECG/medicine tests: ordered and independent interpretation performed. Decision-making details documented in ED Course.  Risk OTC drugs. Prescription drug management. Decision regarding hospitalization. Risk Details: Heart score 7, will need inpatient medication management     Final Clinical  Impression(s) / ED Diagnoses Final diagnoses:  Tachycardia  Chest pain, unspecified type  Hyperglycemia  PVD (peripheral vascular disease) (HCC)   The patient appears reasonably stabilized for admission considering the current resources, flow, and capabilities available in the ED at this  time, and I doubt any other [facility] requiring further screening and/or treatment in the ED prior to admission.  Rx / DC Orders ED Discharge Orders     None         Shanautica Forker, MD 11/22/23 9457

## 2023-11-22 NOTE — H&P (Signed)
 History and Physical    Monica Thompson FMW:[id number] DOB: 1950/10/31 DOA: 11/22/2023  DOS: the patient was seen and examined on 11/22/2023  PCP: Maree Isles, MD   Patient coming from: Home  I have personally briefly reviewed patient's old medical records in [facility]  HPI: 73 year old female with a prior history of coronary disease status post stent back in 2016, history of NSTEMI, morbid obesity, COPD, hypertension who has not seen a physician in at least 4 to 5 years who presents to the ER via EMS due to SVT.  She states that around 11:30 PM to midnight she was coming from the bathroom.  She felt short of breath.  She did not describe any palpitations.  Daughter called EMS.  Reportedly EMS found her in SVT.  After 3 vagal maneuvers she broke to sinus tachycardia.  She is brought to the ER for further evaluation.  Patient denies any recent fever or chills, no cough or diarrhea.  No nausea.  No vomiting.  On arrival to the ER temp 97.7 heart rate 111 blood pressure 147 already 85.  96% on room air.  White count 8.8, hemoglobin 15.4, platelets of 341 Sodium 137, potassium 4.3, chloride 103, bicarb 22, BUN of 23, creatinine 1.0, glucose of 403 BNP 76.3 COVID-negative, influenza negative, RSV negative Troponin I negative x 2 sets (10, 13)  [facility] consulted for admission.  Significant Events: Admitted 11/22/2023 for SVT   Significant Labs: White count 8.8, hemoglobin 15.4, platelets of 341 Sodium 137, potassium 4.3, chloride 103, bicarb 22, BUN of 23, creatinine 1.0, glucose of 403 BNP 76.3 COVID-negative, influenza negative, RSV negative Troponin I negative x 2 sets (10, 13)  Significant Imaging Studies: CXR No evidence of acute chest disease. Mild cardiomegaly. Aortic atherosclerosis.  Antibiotic Therapy: Anti-infectives (From admission, onward)    None       Procedures:   Consultants:    ED Course: labs unremarkable except for glucose of 403.    Review of Systems:  Review of Systems  Constitutional: Negative.   Eyes: Negative.   Respiratory:  Positive for shortness of breath.   Cardiovascular:  Positive for palpitations. Negative for chest pain.       Has some leg swelling but today her leg swelling is better than normal. Confirmed by family.  Gastrointestinal: Negative.   Genitourinary: Negative.   Musculoskeletal: Negative.   Skin:  Positive for rash.       Chronic rash of her bilateral feet.  Neurological: Negative.   Endo/Heme/Allergies:        Family states pt keeps her air conditioning on her room all the time. Even in winter. Temp set at 61 degree F.  Psychiatric/Behavioral: Negative.    All other systems reviewed and are negative.  Past Medical History:  Diagnosis Date   CAD S/P percutaneous coronary angioplasty 11/13/2014   a. 11/2014 NSTEMI/PCI: LM nl, LAD 80p (3.5x18 Resolute DES), LCX 40p, 47m (3.5x15 Resolute DES), RCA 51m, EF 50%.   Cholecystitis, acute with cholelithiasis 09/10/2013   COPD (chronic obstructive pulmonary disease) (HCC)    Essential hypertension    Hyperlipidemia    Non-ST elevation (NSTEMI) myocardial infarction [facility]) 11/13/2014   NSTEMI (non-ST elevated myocardial infarction) (HCC) 11/30/2014   NSTEMI, initial episode of care (HCC) 11/30/2014   PONV (postoperative nausea and vomiting)    Tobacco abuse     Past Surgical History:  Procedure Laterality Date   ABDOMINAL HYSTERECTOMY     CHOLECYSTECTOMY N/A 09/12/2013  Procedure: LAPAROSCOPIC CHOLECYSTECTOMY;  Surgeon: Oneil DELENA Budge, MD;  Location: [facility];  Service: General;  Laterality: N/A;   LEFT HEART CATHETERIZATION WITH CORONARY ANGIOGRAM N/A 11/30/2014   Procedure: LEFT HEART CATHETERIZATION WITH CORONARY ANGIOGRAM;  Surgeon: Debby DELENA Sor, MD;  Location: [facility] CATH LAB;  Service: Cardiovascular;  Laterality: N/A;   PERCUTANEOUS CORONARY STENT INTERVENTION (PCI-S) N/A 12/04/2014   Procedure: PERCUTANEOUS CORONARY STENT INTERVENTION  (PCI-S);  Surgeon: Debby DELENA Sor, MD;  Location: [facility] CATH LAB;  Service: Cardiovascular;  Laterality: N/A;   TONSILLECTOMY      reports that she quit smoking about 8 years ago. Her smoking use included cigarettes. She has never used smokeless tobacco. She reports that she does not drink alcohol  and does not use drugs.  No Known Allergies  Family History  Problem Relation Age of Onset   Heart attack Mother    Cancer Father    Deep vein thrombosis Father     Prior to Admission medications   Medication Sig Start Date End Date Taking? Authorizing Provider  albuterol  (PROVENTIL  HFA;VENTOLIN  HFA) 108 (90 Base) MCG/ACT inhaler Inhale 1-2 puffs into the lungs every 6 (six) hours as needed for wheezing or shortness of breath.    [provider]  aspirin  EC 81 MG tablet Take 1 tablet (81 mg total) by mouth daily. 12/05/14   Vivienne Lonni Ingle, NP  atorvastatin  (LIPITOR ) 40 MG tablet Take 1 tablet (40 mg total) by mouth daily. 06/05/15   Charls Pearla DELENA, MD  furosemide  (LASIX ) 20 MG tablet Take 20 mg daily ONLY as needed for leg swelling 06/14/15   Charls Pearla DELENA, MD  metoprolol  tartrate (LOPRESSOR ) 25 MG tablet Take 1 tablet (25 mg total) by mouth 2 (two) times daily. 06/04/15   Charls Pearla DELENA, MD  nitroGLYCERIN  (NITROSTAT ) 0.4 MG SL tablet Place 1 tablet (0.4 mg total) under the tongue every 5 (five) minutes as needed for chest pain. 12/05/14   Vivienne Lonni Ingle, NP  pantoprazole  (PROTONIX ) 20 MG tablet Take 1 tablet (20 mg total) by mouth daily. 06/04/15   Charls Pearla DELENA, MD  potassium chloride  (K-DUR) 10 MEQ tablet Take 10 meq daily ONLY when you take Lasix  06/14/15   Charls Pearla DELENA, MD  traMADol  (ULTRAM ) 50 MG tablet Take 50 mg by mouth 2 (two) times daily.    [provider]   Physical Exam: Vitals:   11/22/23 0530 11/22/23 0545 11/22/23 0556 11/22/23 0600  BP: 128/67 137/77  121/72  Pulse: 80 85  88  Resp: 16 17  17   Temp:   98.1 F  (36.7 C)   TempSrc:   Oral   SpO2: 95% 94%  94%   Physical Exam Vitals and nursing note reviewed.  Constitutional:      General: She is not in acute distress.    Appearance: She is obese. She is not toxic-appearing or diaphoretic.  HENT:     Head: Normocephalic and atraumatic.     Nose: Nose normal.     Mouth/Throat:     Comments: edentulous Eyes:     General: No scleral icterus. Cardiovascular:     Rate and Rhythm: Normal rate.     Pulses: Normal pulses.     Heart sounds: Normal heart sounds.  Pulmonary:     Effort: Pulmonary effort is normal.     Breath sounds: Normal breath sounds.  Abdominal:     General: Abdomen is protuberant. Bowel sounds are normal. There is no distension.  Palpations: Abdomen is soft.     Tenderness: There is no abdominal tenderness. There is no guarding or rebound.  Musculoskeletal:     Right lower leg: Edema present.     Left lower leg: Edema present.     Comments: Large fatty lipoma left upper biceps area  Skin:    General: Skin is warm and dry.     Capillary Refill: Capillary refill takes less than 2 seconds.     Findings: Rash present.     Comments: +1 pitting pretibial edema. Rash bilateral feet with fungus between her toes Pt and family state the current level of swelling is better than her normal.  Neurological:     Mental Status: She is alert and oriented to person, place, and time.   Labs on Admission: I have personally reviewed following labs and imaging studies  CBC: Recent Labs  Lab 11/22/23 0212  WBC 8.8  NEUTROABS 6.2  HGB 15.4*  HCT 46.8*  MCV 89.1  PLT 341   Basic Metabolic Panel: Recent Labs  Lab 11/22/23 0212  NA 137  K 4.3  CL 103  CO2 22  GLUCOSE 403*  BUN 23  CREATININE 1.00  CALCIUM  9.2   GFR: CrCl cannot be calculated (Unknown ideal weight.). Cardiac Enzymes: Recent Labs  Lab 11/22/23 0212 11/22/23 0358  TROPONINIHS 10 13   BNP (last 3 results) Recent Labs    11/22/23 0212  BNP 76.3    Radiological Exams on Admission: I have personally reviewed images DG Chest Portable 1 View Result Date: 11/22/2023 CLINICAL DATA:  Shortness of breath. EXAM: PORTABLE CHEST 1 VIEW COMPARISON:  Chest 11/30/2014 FINDINGS: There is mild cardiomegaly. Central vessels are normal in caliber. The mediastinum is normally outlined. There is calcification of the transverse aorta. The lungs are clear. Moderate thoracic spondylosis. Compare: Unchanged. IMPRESSION: No evidence of acute chest disease. Mild cardiomegaly. Aortic atherosclerosis. Electronically Signed   By: Francis Quam M.D.   On: 11/22/2023 04:16    EKG: My personal interpretation of EKG shows: sinus tachycardia    Assessment/Plan Principal Problem:   SVT (supraventricular tachycardia) (HCC) Active Problems:   COPD (chronic obstructive pulmonary disease) (HCC)   Hyperlipidemia   Obesity, Class III, BMI 40-49.9 (morbid obesity) (HCC)   CAD S/P percutaneous coronary angioplasty   Hyperglycemia  Assessment and Plan: * SVT (supraventricular tachycardia) (HCC) Admit med/tele obs bed. Troponin negative. Check tsh. Check echo. Consider low dose toprol -XL.  CAD S/P percutaneous coronary angioplasty Pt takes no regular medications. Start ASA 81 mg daily  Obesity, Class III, BMI 40-49.9 (morbid obesity) (HCC) Estimated body mass index is 49.78 kg/m as calculated from the following:   Height as of 02/03/19: 5' 4 (1.626 m).   Weight as of 02/03/19: 131.5 kg.  Hyperlipidemia Pt takes no regular medications. Check lipid panel  COPD (chronic obstructive pulmonary disease) (HCC) Stable.  Hyperglycemia Check A1c. Given her morbid obesity, I predict her A1c will be elevated. She will need f/u with PCP for hyperglycemia management. Add SSI for now.   DVT prophylaxis: SQ Heparin  Code Status: Full Code Family Communication: discussed with pt, dtr and son-in-law at bedside  Disposition Plan: return home  Consults called: none   Admission status: Observation, Telemetry bed   Camellia Door, DO [facility] 11/22/2023, 6:06 AM

## 2023-11-22 NOTE — Assessment & Plan Note (Signed)
 Stable

## 2023-11-22 NOTE — Hospital Course (Addendum)
 Same day note  73 year old female with past medical history of coronary artery disease status post stent, morbid obesity, COPD, hypertension who has not seen a physician in at least 4 to 5 years presented to hospital with shortness of breath.  EMS was called in and was noted to be in supraventricular tachycardia.  She got 3 vagal maneuvers improved sinus tachycardia and was brought into the hospital.  In the ED, patient was tachycardic.  Labs showed the hyperglycemia.  BNP 73.  COVID influenza and RSV was negative.  Troponin is within negative.  Chest x-ray was negative for any infiltrate.  Patient was then [facility] for further evaluation and treatment.  Assessment and Plan.  SVT (supraventricular tachycardia)  Troponin negative.  TSH of 2.8..  Will continue low dose toprol -XL.   2D echocardiogram with LV ejection fraction of 55 to 60% with grade 1 diastolic dysfunction.   CAD S/P percutaneous coronary angioplasty Pt takes no regular medications.  Continue aspirin .   Obesity, Class III, BMI 40-49.9 (morbid obesity)  Would benefit from weight loss as outpatient.  TSH of 2.8.   Hyperlipidemia Pt takes no regular medications.    COPD (chronic obstructive pulmonary disease) Stable.   Hyperglycemia likely new onset diabetes. Hemoglobin A1c of 10.4. Spoke about new diabetes.  Patient was extensively counseled regarding this.  Diabetic coordinator to be consulted.

## 2023-11-22 NOTE — Assessment & Plan Note (Addendum)
 Estimated body mass index is 49.78 kg/m as calculated from the following:   Height as of 02/03/19: 5\' 4"  (1.626 m).   Weight as of 02/03/19: 131.5 kg.

## 2023-11-22 NOTE — Progress Notes (Signed)
 Echocardiogram 2D Echocardiogram has been performed.  Ardis Fullwood N Anella Nakata,RDCS 11/22/2023, 3:38 PM

## 2023-11-22 NOTE — Assessment & Plan Note (Addendum)
 Admit med/tele obs bed. Troponin negative. Check tsh. Check echo. Consider low dose toprol -XL.

## 2023-11-22 NOTE — ED Notes (Signed)
 Patient states she has not used the bathroom and urinated in brief at this time.

## 2023-11-22 NOTE — Progress Notes (Signed)
 Same day note  73 year old female with past medical history of coronary artery disease status post stent, morbid obesity, COPD, hypertension who has not seen a physician in at least 4 to 5 years presented to hospital with shortness of breath.  EMS was called in and was noted to be in supraventricular tachycardia.  She got 3 vagal maneuvers improved sinus tachycardia and was brought into the hospital.  In the ED patient was tachycardic.  Labs showed the hyperglycemia.  BNP 73.  COVID influenza and RSV was negative.  Troponin is within negative.  Chest x-ray was negative for any infiltrate.   Patient seen and examined at bedside.  Patient was admitted to the hospital for shortness of breath.  At the time of my evaluation, patient complains of no chest pain dizziness palpitation has slightly improved  Physical examination reveals morbidly obese female, decreased breath sounds bilaterally.  Bilateral lower extremities with erythema with mild edema.  Laboratory data and imaging was reviewed  Assessment and Plan.  SVT (supraventricular tachycardia)  Troponin negative.  TSH of 2.8.. Consider low dose toprol -XL.  Check 2D echocardiogram.   CAD S/P percutaneous coronary angioplasty Pt takes no regular medications. Start ASA 81 mg daily   Obesity, Class III, BMI 40-49.9 (morbid obesity)  Would benefit from weight loss as outpatient.   Hyperlipidemia Pt takes no regular medications. Check lipid panel   COPD (chronic obstructive pulmonary disease) Stable.  New inhalers.   Hyperglycemia likely new onset diabetes. Hemo-globin A1c of 10.4 today.  Encouraged about diabetes and lifestyle modification with her. .  Diabetic coordinator will be consulted.  Will need to closely follow-up with PCP as outpatient.  Has not seen a doctor infusion.  No Charge  Signed,  Vernal Anselm Alstrom, MD [facility]

## 2023-11-22 NOTE — Assessment & Plan Note (Signed)
 Check A1c. Given her morbid obesity, I predict her A1c will be elevated. She will need f/u with PCP for hyperglycemia management. Add SSI for now.

## 2023-11-22 NOTE — ED Notes (Signed)
 Pt alert and oriented, resting comfortably with no signs of distress, pt eating, water provided.

## 2023-11-22 NOTE — Assessment & Plan Note (Signed)
 Pt takes no regular medications. Check lipid panel

## 2023-11-22 NOTE — ED Triage Notes (Signed)
 Patient BIB EMS from home due to tachycardia. EMS reports family called out due to abnormal breathing. EMS reports patient initially in SVT. 3 vagal manuevers were performed converting patient to sinus tachycardia in the rates of 110s. Family states patient refuses to go to Dr. And hasn't been for 10 years. Patient is A&Ox4.

## 2023-11-23 ENCOUNTER — Telehealth (HOSPITAL_COMMUNITY): Payer: Self-pay

## 2023-11-23 ENCOUNTER — Other Ambulatory Visit (HOSPITAL_COMMUNITY): Payer: Self-pay

## 2023-11-23 DIAGNOSIS — E119 Type 2 diabetes mellitus without complications: Secondary | ICD-10-CM | POA: Insufficient documentation

## 2023-11-23 DIAGNOSIS — E1122 Type 2 diabetes mellitus with diabetic chronic kidney disease: Secondary | ICD-10-CM | POA: Insufficient documentation

## 2023-11-23 DIAGNOSIS — I471 Supraventricular tachycardia, unspecified: Secondary | ICD-10-CM | POA: Diagnosis not present

## 2023-11-23 LAB — GLUCOSE, CAPILLARY
Glucose-Capillary: 238 mg/dL — ABNORMAL HIGH (ref 70–99)
Glucose-Capillary: 271 mg/dL — ABNORMAL HIGH (ref 70–99)
Glucose-Capillary: 267 mg/dL — ABNORMAL HIGH (ref 70–99)

## 2023-11-23 LAB — CBG MONITORING, ED: Glucose-Capillary: 205 mg/dL — ABNORMAL HIGH (ref 70–99)

## 2023-11-23 MED ORDER — METFORMIN HCL 500 MG PO TABS: 500.0000 mg | ORAL_TABLET | Freq: Two times a day (BID) | ORAL | 0 refills | Status: DC

## 2023-11-23 MED ORDER — METOPROLOL SUCCINATE ER 25 MG PO TB24: 12.5000 mg | ORAL_TABLET | Freq: Every day | ORAL | 2 refills | Status: DC

## 2023-11-23 MED ORDER — INSULIN GLARGINE-YFGN 100 UNIT/ML ~~LOC~~ SOLN
10.0000 [IU] | Freq: Every day | SUBCUTANEOUS | Status: DC
  Administered 2023-11-23 – 2023-11-27 (×5): 10 [IU] via SUBCUTANEOUS
  Filled 2023-11-23 (×5): qty 0.1

## 2023-11-23 MED ORDER — METOPROLOL SUCCINATE ER 25 MG PO TB24
12.5000 mg | ORAL_TABLET | Freq: Every day | ORAL | Status: DC
  Administered 2023-11-23 – 2023-11-24 (×2): 12.5 mg via ORAL
  Filled 2023-11-23 (×2): qty 1

## 2023-11-23 NOTE — Evaluation (Signed)
 Physical Therapy Evaluation Patient Details Name: Monica Thompson MRN: [id number] DOB: Apr 07, 1951 Today's Date: 11/23/2023  History of Present Illness  73 year old female who presents to the ER via EMS due to SVT. Prior history of coronary disease status post stent back in 2016, history of NSTEMI, morbid obesity, COPD, hypertension who has not seen a physician in at least 4 to 5 years.  Clinical Impression  Pt presents with admitting diagnosis above. Pt today was only able to stand EOB and take sidesteps with Min A. Pt appeared to be very anxious with mobility with noted tachypnea despite vital signs being stable. PTA pt reports she uses a SPC short household distances and "rents" a WC when needing to go further. Pt reports that she recently moved in with her daughter, SIL, and granddaughter who all work from home however pt does have 6 steps to enter. Patient will benefit from continued inpatient follow up therapy, <3 hours/day. PT will continue to follow.         If plan is discharge home, recommend the following: Two people to help with walking and/or transfers;A lot of help with bathing/dressing/bathroom;Assistance with cooking/housework;Direct supervision/assist for medications management;Assist for transportation;Help with stairs or ramp for entrance   Can travel by private vehicle   No    Equipment Recommendations Rolling Hedlund (2 wheels);Wheelchair (measurements PT);Wheelchair cushion (measurements PT);Hospital bed;BSC/3in1;Other (comment) (Per accepting facility)  Recommendations for Other Services  OT consult    Functional Status Assessment Patient has had a recent decline in their functional status and demonstrates the ability to make significant improvements in function in a reasonable and predictable amount of time.     Precautions / Restrictions Precautions Precautions: Fall Restrictions Weight Bearing Restrictions Per Provider Order: No      Mobility  Bed  Mobility Overal bed mobility: Needs Assistance Bed Mobility: Supine to Sit, Sit to Supine     Supine to sit: Min assist Sit to supine: Min assist   General bed mobility comments: Min A for trunk elevation    Transfers Overall transfer level: Needs assistance Equipment used: Rolling Leathers (2 wheels) Transfers: Sit to/from Stand Sit to Stand: Min assist           General transfer comment: Cues for hand placement. Min A to power up and take sidesteps.    Ambulation/Gait               General Gait Details: Deferred for safety.  Stairs            Wheelchair Mobility     Tilt Bed    Modified Rankin (Stroke Patients Only)       Balance Overall balance assessment: Needs assistance Sitting-balance support: Bilateral upper extremity supported, Feet supported Sitting balance-Leahy Scale: Poor Sitting balance - Comments: Pt needed to have bilateral support at all times. Postural control: Right lateral lean, Left lateral lean, Posterior lean Standing balance support: Bilateral upper extremity supported, Reliant on assistive device for balance, During functional activity Standing balance-Leahy Scale: Poor Standing balance comment: Reliant on RW                             Pertinent Vitals/Pain Pain Assessment Pain Assessment: No/denies pain    Home Living Family/patient expects to be discharged to:: Private residence Living Arrangements: Children;Other relatives Available Help at Discharge: Family;Available 24 hours/day Type of Home: House Home Access: Stairs to enter Entrance Stairs-Rails: Right;Left Entrance Stairs-Number of Steps: 6  Home Layout: One level Home Equipment: Cane - quad;Cane - single point;Toilet riser;Wheelchair - manual      Prior Function Prior Level of Function : Needs assist             Mobility Comments: Pt reports that she typically doesnt leave her house. SPC short distance around house or WC. ADLs  Comments: Pt reports mostly she can perform on her own however sometimes she needs help.     Extremity/Trunk Assessment   Upper Extremity Assessment Upper Extremity Assessment: Generalized weakness    Lower Extremity Assessment Lower Extremity Assessment: Generalized weakness    Cervical / Trunk Assessment Cervical / Trunk Assessment: Other exceptions (Large body habitus)  Communication   Communication Communication: No apparent difficulties  Cognition Arousal: Alert Behavior During Therapy: Anxious, Restless Overall Cognitive Status: Within Functional Limits for tasks assessed                                 General Comments: Pt appears to be very anxious with mobility and noted to have a high fear of falling.        General Comments General comments (skin integrity, edema, etc.): VSS on RA    Exercises     Assessment/Plan    PT Assessment Patient needs continued PT services  PT Problem List Decreased strength;Decreased range of motion;Decreased activity tolerance;Decreased balance;Decreased mobility;Decreased cognition;Decreased coordination;Decreased knowledge of use of DME;Decreased knowledge of precautions;Decreased safety awareness;Cardiopulmonary status limiting activity       PT Treatment Interventions DME instruction;Gait training;Stair training;Therapeutic activities;Functional mobility training;Therapeutic exercise;Balance training;Neuromuscular re-education;Cognitive remediation;Patient/family education    PT Goals (Current goals can be found in the Care Plan section)  Acute Rehab PT Goals Patient Stated Goal: to go home PT Goal Formulation: With patient Time For Goal Achievement: 12/07/23 Potential to Achieve Goals: Fair    Frequency Min 1X/week     Co-evaluation               AM-PAC PT "6 Clicks" Mobility  Outcome Measure Help needed turning from your back to your side while in a flat bed without using bedrails?: A  Little Help needed moving from lying on your back to sitting on the side of a flat bed without using bedrails?: A Little Help needed moving to and from a bed to a chair (including a wheelchair)?: A Lot Help needed standing up from a chair using your arms (e.g., wheelchair or bedside chair)?: A Little Help needed to walk in hospital room?: A Lot Help needed climbing 3-5 steps with a railing? : Total 6 Click Score: 14    End of Session Equipment Utilized During Treatment: Gait belt Activity Tolerance: Patient limited by fatigue;Other (comment) (limited by what appeared to be anxiety.) Patient left: in bed;with call bell/phone within reach;with bed alarm set Nurse Communication: Mobility status PT Visit Diagnosis: Other abnormalities of gait and mobility (R26.89)    Time: 6213-0865 PT Time Calculation (min) (ACUTE ONLY): 31 min   Charges:   PT Evaluation $PT Eval Moderate Complexity: 1 Mod PT Treatments $Therapeutic Activity: 8-22 mins PT General Charges $$ ACUTE PT VISIT: 1 Visit         Rasheem Figiel B, PT, DPT Acute Rehab Services [phone]   Bridgette Wolden 11/23/2023, 2:00 PM

## 2023-11-23 NOTE — Inpatient Diabetes Management (Addendum)
 Inpatient Diabetes Program Recommendations  AACE/ADA: New Consensus Statement on Inpatient Glycemic Control (2015)  Target Ranges:  Prepandial:   less than 140 mg/dL      Peak postprandial:   less than 180 mg/dL (1-2 hours)      Critically ill patients:  140 - 180 mg/dL   Lab Results  Component Value Date   GLUCAP 238 (H) 11/23/2023   HGBA1C 10.4 (H) 11/22/2023    Review of Glycemic Control  Diabetes history: New DM this admission/possibly new insulin   Current orders for Inpatient glycemic control:  Novolog  0-20 units tid + hs  A1c 10.4% this admission  Inpatient Diabetes Program Recommendations:    -   Start Semglee  10 units  Will see pt.  Spoke with pt in the room regarding A1c of 10.4% new diabetes diagnosis and the potential need for insulin  at time of discharge. I also spoke with pt daughters over the phone prior to visiting pt. Pt has a lot of diet indiscretion and has a love of potatoes. Pt daughters feel it would be difficult for their mom to have compliance with on a lot of "sticks" a day. Daughters reporting about pt weight and not being mobile. Both daughters are on Mounjaro and wanted to look into that option for weight loss for their mother. Would be hesitant to starts SGLT 2 with body habitus. Pt could benefit from CGM and would also need a glucometer at home.  Discussed diet in detail. Discussed glucose and A1c goals. Encouraged compliance with medication and glucose control outpatient to reduce common complications. Discussed s/s of hypo and hyperglycemia and treatment for both. Discussed when to check glucose. Briefly showed the insulin  pen.  Copay for injectables are high will need to discuss with pt and daughters.  -   Ozempic has been APPROVED  Copay is $482.83   Thanks, Eloise Hake RN, MSN, BC-ADM Inpatient Diabetes Coordinator Team Pager [phone] (8a-5p)

## 2023-11-23 NOTE — TOC Progression Note (Addendum)
 Transition of Care [facility]) - Progression Note    Patient Details  Name: Monica Thompson MRN: [id number] Date of Birth: 04/02/1951  Transition of Care [facility]) CM/SW Contact  Arron Big, [place] Phone Number: 11/23/2023, 3:42 PM  Clinical Narrative:   CSW called pts dtr, [name], who stated she is picking up her brother and sister before coming to the hospital to discuss PT recs for SNF for pt. [name] stated she will call CSW when they arrive.   4:26 PM CSW spoke with family about PT recs for SNF. Family wants CSW to discuss with pt. CSW met pt at bedside and discussed recs for STR. CSW explained that insurance typically pays for 14-20 days at 100%. Pt expressed verbal understanding and gave CSW permission to fax out referrals. CSW informed family of pts decision.   Family would like to discuss option of HH with RNCM. CSW notified RNCM.  TOC will continue to follow.   Expected Discharge Plan: Skilled Nursing Facility Barriers to Discharge: Continued Medical Work up  Expected Discharge Plan and Services In-house Referral: NA Discharge Planning Services: CM Consult Post Acute Care Choice: NA Living arrangements for the past 2 months: Single Family Home Expected Discharge Date: 11/23/23               DME Arranged: N/A DME Agency: NA                   Social Determinants of Health (SDOH) Interventions SDOH Screenings   Food Insecurity: No Food Insecurity (11/23/2023)  Housing: Low Risk  (11/23/2023)  Transportation Needs: No Transportation Needs (11/23/2023)  Utilities: Not At Risk (11/23/2023)  Social Connections: Socially Isolated (11/23/2023)  Tobacco Use: Medium Risk (11/22/2023)    Readmission Risk Interventions     No data to display

## 2023-11-23 NOTE — ED Notes (Signed)
Bgl 205

## 2023-11-23 NOTE — TOC Progression Note (Signed)
 Transition of Care [facility]) - Progression Note    Patient Details  Name: Monica Thompson MRN: [id number] Date of Birth: 03/31/1951  Transition of Care [facility]) CM/SW Contact  Jennett Model, RN Phone Number: 11/23/2023, 2:32 PM  Clinical Narrative:    Per pt eval rec SNF, she is new diabetic patient also.   Expected Discharge Plan: Skilled Nursing Facility Barriers to Discharge: Continued Medical Work up  Expected Discharge Plan and Services In-house Referral: NA Discharge Planning Services: CM Consult Post Acute Care Choice: NA Living arrangements for the past 2 months: Single Family Home Expected Discharge Date: 11/23/23               DME Arranged: N/A DME Agency: NA                   Social Determinants of Health (SDOH) Interventions SDOH Screenings   Food Insecurity: No Food Insecurity (11/23/2023)  Housing: Low Risk  (11/23/2023)  Transportation Needs: No Transportation Needs (11/23/2023)  Utilities: Not At Risk (11/23/2023)  Social Connections: Socially Isolated (11/23/2023)  Tobacco Use: Medium Risk (11/22/2023)    Readmission Risk Interventions     No data to display

## 2023-11-23 NOTE — Telephone Encounter (Signed)
 Pharmacy Patient Advocate Encounter  Received notification from [facility] that Prior Authorization for Ozempic has been APPROVED from 10/14/2023 to 10/12/2024. Ran test claim, Copay is $482.83. This test claim was processed through [facility]- copay amounts may vary at other pharmacies due to pharmacy/plan contracts, or as the patient moves through the different stages of their insurance plan.   PA #/Case ID/Reference #: NA

## 2023-11-23 NOTE — Progress Notes (Signed)
 Pt with bilateral redness with what appears to be cellulitis of bilat feet.  Wound/Ostomy RN  consulted.

## 2023-11-23 NOTE — NC FL2 (Signed)
 [place]  MEDICAID FL2 LEVEL OF CARE FORM     IDENTIFICATION  Patient Name: Monica Thompson Birthdate: 1950-11-13 Sex: female Admission Date (Current Location): 11/22/2023  [facility] and [place] Number:  Producer, television/film/video and Address:  [facility], [address] [place], [place] [address]      Provider Number: [id number]  Attending Physician Name and Address:  Rosena Conradi, MD  Relative Name and Phone Number:       Current Level of Care: Hospital Recommended Level of Care: Skilled Nursing Facility Prior Approval Number:    Date Approved/Denied:   PASRR Number: [id number]  Discharge Plan: SNF    Current Diagnoses: Patient Active Problem List   Diagnosis Date Noted   Type 2 diabetes mellitus with chronic kidney disease, without long-term current use of insulin  (HCC) 11/23/2023   SVT (supraventricular tachycardia) (HCC) 11/22/2023   Hyperglycemia 11/22/2023   Obesity, Class III, BMI 40-49.9 (morbid obesity) (HCC) 12/05/2014   COPD (chronic obstructive pulmonary disease) (HCC)    Hypertension    Hyperlipidemia    CAD S/P percutaneous coronary angioplasty 11/13/2014    Orientation RESPIRATION BLADDER Height & Weight     Self, Time, Situation, Place  Normal Continent Weight:   Height:     BEHAVIORAL SYMPTOMS/MOOD NEUROLOGICAL BOWEL NUTRITION STATUS      Continent Diet (see dc summary)  AMBULATORY STATUS COMMUNICATION OF NEEDS Skin   Extensive Assist Verbally Normal                       Personal Care Assistance Level of Assistance  Bathing, Feeding, Dressing Bathing Assistance: Maximum assistance Feeding assistance: Limited assistance Dressing Assistance: Maximum assistance     Functional Limitations Info  Sight, Hearing, Speech Sight Info: Adequate Hearing Info: Adequate Speech Info: Adequate    SPECIAL CARE FACTORS FREQUENCY  PT (By licensed PT), OT (By licensed OT)     PT Frequency: 5x week OT Frequency: 5x week             Contractures Contractures Info: Not present    Additional Factors Info  Code Status, Insulin  Sliding Scale Code Status Info: Full     Insulin  Sliding Scale Info: See dc summary       Current Medications (11/23/2023):  This is the current hospital active medication list Current Facility-Administered Medications  Medication Dose Route Frequency Provider Last Rate Last Admin   acetaminophen  (TYLENOL ) tablet 650 mg  650 mg Oral Q6H PRN Unk Garb, DO   650 mg at 11/22/23 1926   Or   acetaminophen  (TYLENOL ) suppository 650 mg  650 mg Rectal Q6H PRN Unk Garb, DO       aspirin  EC tablet 81 mg  81 mg Oral Daily Unk Garb, DO   81 mg at 11/23/23 1012   heparin  injection 5,000 Units  5,000 Units Subcutaneous Q8H Unk Garb, DO   5,000 Units at 11/23/23 1547   insulin  aspart (novoLOG ) injection 0-20 Units  0-20 Units Subcutaneous TID WC Unk Garb, DO   11 Units at 11/23/23 1548   insulin  aspart (novoLOG ) injection 0-5 Units  0-5 Units Subcutaneous QHS Unk Garb, DO   2 Units at 11/22/23 2245   insulin  glargine-yfgn (SEMGLEE ) injection 10 Units  10 Units Subcutaneous Daily Pokhrel, Laxman, MD   10 Units at 11/23/23 1547   metoprolol  succinate (TOPROL -XL) 24 hr tablet 12.5 mg  12.5 mg Oral Daily Pokhrel, Laxman, MD   12.5 mg at 11/23/23 1547  ondansetron  (ZOFRAN ) tablet 4 mg  4 mg Oral Q6H PRN Unk Garb, DO       Or   ondansetron  (ZOFRAN ) injection 4 mg  4 mg Intravenous Q6H PRN Unk Garb, DO   4 mg at 11/22/23 2026     Discharge Medications: Please see discharge summary for a list of discharge medications.  Relevant Imaging Results:  Relevant Lab Results:   Additional Information SS# [id number] [address] [place], LCSWA

## 2023-11-23 NOTE — Consult Note (Signed)
 [facility] Liaison Consult Note    11/23/2023  Monica Thompson 1951-07-29 [id number]  Insurance: Monica Thompson Medicare   Primary Care Provider: Theoplis Fix, MD with [facility], this provider is listed for the transition of care follow up appointments  and VBCI Transition of Care calls.  She states it's been a while since she has seen Dr. Mason Thompson and is hopeful he will continue to see her.   [facility] Liaison met patient at bedside at [facility].  Explained for bedside visit requested by nursing for patient who is potentially for discharge home today for as well. post hospital follow up support and care needs. Patient states it would be best for anyone to follow up with her daughter, Monica Thompson at [phone]  1203 PM:  Spoke with daughters via phone call with patient at the bedside with Monica Thompson and Monica Thompson family, they verbalized concerns for skin issues and that they know she is diagnosed with diabetes and have no knowledge of what the medications or checking her her blood sugars.  They had requested to speak with the attending doctor.  Followed up with inpatient TOC RN and unit RN about the concerns.   2:20 pm Reviewed of PT recommendations noted and Diabetes Coordinator has seen as well.   The patient was screened for referral for RN regarding post hospitalization with potential follow up needs of patient with new onset Diabetes and admitted in "OBS" with tachycardia as well.  The patient was assessed for potential Community Care Coordination service needs for post hospital transition for care coordination. Review of patient's electronic medical record reveals patient is for home and currently awaiting PT and Diabetes Coordinator.   Plan: [facility] Liaison will continue to follow progress and disposition to asess for post hospital community care coordination/management needs.  Referral request for community care coordination: Patient  states she wouldn't mind follow up. Pending disposition.   VBCI Community Care, Population Health does not replace or interfere with any arrangements made by the Inpatient Transition of Care team.   For questions contact:   Monica Cape, RN, BSN, CCM [place]  [facility],  [facility] Liaison Direct Dial: [phone] or secure chat Email: [email or web address]

## 2023-11-23 NOTE — Progress Notes (Signed)
 PROGRESS NOTE  Monica Thompson ZOX:[id number] DOB: 25-Jun-1951 DOA: 11/22/2023 PCP: Theoplis Fix, MD   LOS: 0 days   Brief narrative:  Same day note  73 year old female with past medical history of coronary artery disease status post stent, morbid obesity, COPD, hypertension who has not seen a physician in at least 4 to 5 years presented to hospital with shortness of breath.  EMS was called in and was noted to be in supraventricular tachycardia.  She got 3 vagal maneuvers improved sinus tachycardia and was brought into the hospital.  In the ED, patient was tachycardic.  Labs showed the hyperglycemia.  BNP 73.  COVID influenza and RSV was negative.  Troponin is within negative.  Chest x-ray was negative for any infiltrate.  Patient was then [facility] for further evaluation and treatment.    Assessment/Plan: Principal Problem:   SVT (supraventricular tachycardia) (HCC) Active Problems:   COPD (chronic obstructive pulmonary disease) (HCC)   Hyperlipidemia   Obesity, Class III, BMI 40-49.9 (morbid obesity) (HCC)   CAD S/P percutaneous coronary angioplasty   Hyperglycemia   Type 2 diabetes mellitus with chronic kidney disease, without long-term current use of insulin  (HCC)   SVT (supraventricular tachycardia)  Troponin negative.  TSH of 2.8..  Will continue low dose toprol -XL.   2D echocardiogram with LV ejection fraction of 55 to 60% with grade 1 diastolic dysfunction.   CAD S/P percutaneous coronary angioplasty Pt takes no regular medications.  Continue aspirin .   Obesity, Class III, BMI 40-49.9 (morbid obesity)  Would benefit from weight loss as outpatient.  TSH of 2.8.   Hyperlipidemia Pt takes no regular medications.    COPD (chronic obstructive pulmonary disease) Stable.   Hyperglycemia likely new onset diabetes. Hemoglobin A1c of 10.4. Spoke about new diabetes.  Patient was extensively counseled regarding this.  Diabetic coordinator to be consulted.  DVT  prophylaxis: heparin  injection 5,000 Units Start: 11/22/23 0600 SCDs Start: 11/22/23 0540   Disposition: Skilled nursing facility.  Status is: Observation  The patient will require care spanning > 2 midnights and should be moved to inpatient because: Need for skilled nursing facility, new diabetes, closer monitoring.    Code Status:     Code Status: Full Code  Family Communication:  Spoke with the patient's daughter on the phone and updated her on 09/21/2024  Consultants: None  Procedures: None  Anti-infectives:  None  Anti-infectives (From admission, onward)    None      Subjective: Today, patient was seen and examined at bedside.  Denies any chest pain, dyspnea, shortness of breath.  PT reported that she was very deconditioned needing therapy.  Patient's daughter concerned about her mobility.  Objective: Vitals:   11/23/23 0914 11/23/23 1000  BP:  110/65  Pulse:  69  Resp:  12  Temp: 97.7 F (36.5 C) 97.8 F (36.6 C)  SpO2:  91%    Intake/Output Summary (Last 24 hours) at 11/23/2023 1513 Last data filed at 11/23/2023 8119 Gross per 24 hour  Intake --  Output 400 ml  Net -400 ml   There were no vitals filed for this visit. There is no height or weight on file to calculate BMI.   Physical Exam:  GENERAL: Patient is alert awake and oriented. Not in obvious distress.  Obese. HENT: No scleral pallor or icterus. Pupils equally reactive to light. Oral mucosa is moist NECK: is supple, no gross swelling noted. CHEST: No crackles or wheezes.  Diminished breath sounds bilaterally. CVS: S1 and  S2 heard, no murmur. Regular rate and rhythm.  ABDOMEN: Soft, non-tender, bowel sounds are present. EXTREMITIES: Erythema of the legs. CNS: Cranial nerves are intact. No focal motor deficits. SKIN: warm and dry without rashes.  Erythema of the bilateral feet.  Data Review: I have personally reviewed the following laboratory data and studies,  CBC: Recent Labs  Lab  12/14/23 0212  WBC 8.8  NEUTROABS 6.2  HGB 15.4*  HCT 46.8*  MCV 89.1  PLT 341   Basic Metabolic Panel: Recent Labs  Lab 12/14/23 0212  NA 137  K 4.3  CL 103  CO2 22  GLUCOSE 403*  BUN 23  CREATININE 1.00  CALCIUM  9.2   Liver Function Tests: No results for input(s): "AST", "ALT", "ALKPHOS", "BILITOT", "PROT", "ALBUMIN" in the last 168 hours. No results for input(s): "LIPASE", "AMYLASE" in the last 168 hours. No results for input(s): "AMMONIA" in the last 168 hours. Cardiac Enzymes: No results for input(s): "CKTOTAL", "CKMB", "CKMBINDEX", "TROPONINI" in the last 168 hours. BNP (last 3 results) Recent Labs    14-Dec-2023 0212  BNP 76.3    ProBNP (last 3 results) No results for input(s): "PROBNP" in the last 8760 hours.  CBG: Recent Labs  Lab 2023-12-14 1126 12-14-23 1702 12-14-23 2149 11/23/23 1008 11/23/23 1109  GLUCAP 228* 190* 206* 205* 238*   Recent Results (from the past 240 hours)  Resp panel by RT-PCR (RSV, Flu A&B, Covid) Anterior Nasal Swab     Status: None   Collection Time: December 14, 2023  2:15 AM   Specimen: Anterior Nasal Swab  Result Value Ref Range Status   SARS Coronavirus 2 by RT PCR NEGATIVE NEGATIVE Final   Influenza A by PCR NEGATIVE NEGATIVE Final   Influenza B by PCR NEGATIVE NEGATIVE Final    Comment: (NOTE) The Xpert Xpress SARS-CoV-2/FLU/RSV plus assay is intended as an aid in the diagnosis of influenza from Nasopharyngeal swab specimens and should not be used as a sole basis for treatment. Nasal washings and aspirates are unacceptable for Xpert Xpress SARS-CoV-2/FLU/RSV testing.  Fact Sheet for Patients: BloggerCourse.com  Fact Sheet for Healthcare Providers: SeriousBroker.it  This test is not yet approved or cleared by the [place]  FDA and has been authorized for detection and/or diagnosis of SARS-CoV-2 by FDA under an Emergency Use Authorization (EUA). This EUA will remain in  effect (meaning this test can be used) for the duration of the COVID-19 declaration under Section 564(b)(1) of the Act, 21 U.S.C. section 360bbb-3(b)(1), unless the authorization is terminated or revoked.     Resp Syncytial Virus by PCR NEGATIVE NEGATIVE Final    Comment: (NOTE) Fact Sheet for Patients: BloggerCourse.com  Fact Sheet for Healthcare Providers: SeriousBroker.it  This test is not yet approved or cleared by the [place]  FDA and has been authorized for detection and/or diagnosis of SARS-CoV-2 by FDA under an Emergency Use Authorization (EUA). This EUA will remain in effect (meaning this test can be used) for the duration of the COVID-19 declaration under Section 564(b)(1) of the Act, 21 U.S.C. section 360bbb-3(b)(1), unless the authorization is terminated or revoked.  Performed at [facility], [address], [place], [place] [address]      Studies: ECHOCARDIOGRAM COMPLETE Result Date: 12-14-23    ECHOCARDIOGRAM REPORT   Patient Name:   Monica Thompson Date of Exam: Dec 14, 2023 Medical Rec #:  [id number]       Height:       64.0 in Accession #:    [id number]  Weight:       290.0 lb Date of Birth:  1951/07/15       BSA:          2.291 m Patient Age:    72 years        BP:           106/93 mmHg Patient Gender: F               HR:           70 bpm. Exam Location:  Inpatient Procedure: 2D Echo, Intracardiac Opacification Agent, Color Doppler and Cardiac            Doppler Indications:    supraventricular tachycardia (svt)  History:        Patient has prior history of Echocardiogram examinations, most                 recent 11/23/2014. CAD, COPD, Arrythmias:NSTEMI; Risk                 Factors:Hypertension, Dyslipidemia and Current Smoker.  Sonographer:    Travis Friedman RDCS Referring Phys: [phone] ERIC CHEN  Sonographer Comments: Technically difficult study due to poor echo windows, no subcostal window and patient is obese.  IMPRESSIONS  1. Images are limited.  2. Left ventricular ejection fraction, by estimation, is 55 to 60%. The left ventricle has normal function. The left ventricle has no regional wall motion abnormalities. Left ventricular diastolic parameters are consistent with Grade I diastolic dysfunction (impaired relaxation).  3. Right ventricular systolic function is low normal. The right ventricular size is normal. Tricuspid regurgitation signal is inadequate for assessing PA pressure.  4. A small pericardial effusion is present. The pericardial effusion is posterior to the left ventricle.  5. The mitral valve is grossly normal. Trivial mitral valve regurgitation.  6. The aortic valve was not well visualized. Aortic valve regurgitation is not visualized. Comparison(s): Prior images unable to be directly viewed. FINDINGS  Left Ventricle: Left ventricular ejection fraction, by estimation, is 55 to 60%. The left ventricle has normal function. The left ventricle has no regional wall motion abnormalities. The left ventricular internal cavity size was normal in size. There is  no left ventricular hypertrophy. Left ventricular diastolic parameters are consistent with Grade I diastolic dysfunction (impaired relaxation). Right Ventricle: The right ventricular size is normal. Right vetricular wall thickness was not well visualized. Right ventricular systolic function is low normal. Tricuspid regurgitation signal is inadequate for assessing PA pressure. Left Atrium: Left atrial size was normal in size. Right Atrium: Right atrial size was normal in size. Pericardium: A small pericardial effusion is present. The pericardial effusion is posterior to the left ventricle. Presence of epicardial fat layer. Mitral Valve: The mitral valve is grossly normal. Mild mitral annular calcification. Trivial mitral valve regurgitation. Tricuspid Valve: The tricuspid valve is grossly normal. Tricuspid valve regurgitation is trivial. Aortic Valve: The  aortic valve was not well visualized. Aortic valve regurgitation is not visualized. Pulmonic Valve: The pulmonic valve was grossly normal. Pulmonic valve regurgitation is trivial. Aorta: The aortic root and ascending aorta are structurally normal, with no evidence of dilitation. IAS/Shunts: The interatrial septum was not well visualized.  LEFT VENTRICLE PLAX 2D LVIDd:         4.90 cm      Diastology LVIDs:         3.10 cm      LV e' medial:    3.05 cm/s LV PW:  1.00 cm      LV E/e' medial:  20.7 LV IVS:        0.80 cm      LV e' lateral:   8.70 cm/s                             LV E/e' lateral: 7.3  LV Volumes (MOD) LV vol d, MOD A2C: 96.3 ml LV vol d, MOD A4C: 137.0 ml LV vol s, MOD A2C: 35.9 ml LV vol s, MOD A4C: 47.3 ml LV SV MOD A2C:     60.4 ml LV SV MOD A4C:     137.0 ml LV SV MOD BP:      77.2 ml RIGHT VENTRICLE RV Basal diam:  3.20 cm RV S prime:     12.60 cm/s TAPSE (M-mode): 2.0 cm LEFT ATRIUM             Index        RIGHT ATRIUM           Index LA diam:        4.70 cm 2.05 cm/m   RA Area:     17.40 cm LA Vol (A2C):   59.5 ml 25.97 ml/m  RA Volume:   43.90 ml  19.16 ml/m LA Vol (A4C):   34.3 ml 14.97 ml/m LA Biplane Vol: 50.3 ml 21.96 ml/m  AORTIC VALVE LVOT Vmax:   88.30 cm/s  AORTA Ao Root diam: 2.90 cm Ao Asc diam:  3.20 cm MITRAL VALVE MV Area (PHT): 2.66 cm MV Decel Time: 285 msec MV E velocity: 63.20 cm/s MV A velocity: 70.20 cm/s MV E/A ratio:  0.90 Teddie Favre MD Electronically signed by Teddie Favre MD Signature Date/Time: 11/22/2023/4:21:29 PM    Final    DG Chest Portable 1 View Result Date: 11/22/2023 CLINICAL DATA:  Shortness of breath. EXAM: PORTABLE CHEST 1 VIEW COMPARISON:  Chest 11/30/2014 FINDINGS: There is mild cardiomegaly. Central vessels are normal in caliber. The mediastinum is normally outlined. There is calcification of the transverse aorta. The lungs are clear. Moderate thoracic spondylosis. Compare: Unchanged. IMPRESSION: No evidence of acute chest disease.  Mild cardiomegaly. Aortic atherosclerosis. Electronically Signed   By: Denman Fischer M.D.   On: 11/22/2023 04:16      Rosena Conradi, MD  [facility] 11/23/2023  If 7PM-7AM, please contact night-coverage

## 2023-11-23 NOTE — Telephone Encounter (Signed)
 Pharmacy Patient Advocate Encounter   Received notification from Physician's Office that prior authorization for Ozempic (0.25 or 0.5 MG/DOSE) 2MG /3ML pen-injectors is required/requested.   Insurance verification completed.   The patient is insured through [facility] .   Per test claim: PA required; PA submitted to above mentioned insurance via CoverMyMeds Key/confirmation #/EOC [id number] Status is pending

## 2023-11-23 NOTE — Evaluation (Signed)
 Occupational Therapy Evaluation Patient Details Name: Monica Thompson MRN: [id number] DOB: [birth year] Today's Date: 11/23/2023   History of Present Illness Pt is a 73 y/o F presentign to ED on 2/9 with SOB, upon EMS arrival pt noted to be in SVT, 3 vagal maneuvers improved sinus tachycardia. CXR negative, admitted for SVT. PMH includes CAD s/p stent 2016, NSTEMI, morbid obesity, COPD, HTN   Clinical Impression   Pt reports living with her family at baseline who can provide assist, pt typically ind with ADLs but sponge bathes at baseline, pt walks short household distances with [employer] or quad cane, at times reports renting a w/c. Pt currently presents with pain in bil feet, needs set up - max A for ADLs, min A for bed mobility and min A for transfers with RW. Pt able to stand and take few steps at EOB before reporting fatigue, and states she's "not sure" if she could walk her typical distance to bathroom like she would at home. Pt presenting with impairments listed below, will follow acutely. Patient will benefit from continued inpatient follow up therapy, <3 hours/day to maximize safety/ind with ADL/functional mobility.        If plan is discharge home, recommend the following: A lot of help with walking and/or transfers;A lot of help with bathing/dressing/bathroom;Assistance with cooking/housework;Assist for transportation;Help with stairs or ramp for entrance    Functional Status Assessment  Patient has had a recent decline in their functional status and demonstrates the ability to make significant improvements in function in a reasonable and predictable amount of time.  Equipment Recommendations  Other (comment) (RW)    Recommendations for Other Services PT consult     Precautions / Restrictions Precautions Precautions: Fall Restrictions Weight Bearing Restrictions Per Provider Order: No      Mobility Bed Mobility Overal bed mobility: Needs Assistance Bed Mobility: Supine to Sit,  Sit to Supine     Supine to sit: Min assist Sit to supine: Min assist   General bed mobility comments: min A to assist trunk to EOB and assist BLE's into bed. Showed pt bed rail mount on [employer] that pt could use at home as bedrail if needed    Transfers Overall transfer level: Needs assistance Equipment used: Rolling [employer] (2 wheels) Transfers: Sit to/from Stand Sit to Stand: Min assist                  Balance Overall balance assessment: Needs assistance Sitting-balance support: Bilateral upper extremity supported, Feet supported Sitting balance-Leahy Scale: Fair Sitting balance - Comments: initially needing UE support and with posterior lean, improved with time and sat EOB CGA   Standing balance support: Bilateral upper extremity supported, Reliant on assistive device for balance, During functional activity Standing balance-Leahy Scale: Poor Standing balance comment: Reliant on RW; initially props forearms on RW due to fatigue                           ADL either performed or assessed with clinical judgement   ADL Overall ADL's : Needs assistance/impaired Eating/Feeding: Set up;Sitting   Grooming: Set up;Sitting   Upper Body Bathing: Moderate assistance;Sitting   Lower Body Bathing: Maximal assistance;Sitting/lateral leans   Upper Body Dressing : Moderate assistance;Sitting   Lower Body Dressing: Maximal assistance;Sitting/lateral leans   Toilet Transfer: Minimal assistance;Rolling [place] (2 wheels)   Toileting- Clothing Manipulation and Hygiene: Maximal assistance       Functional mobility during ADLs: Minimal assistance;Rolling Michelle (  2 wheels)       Vision   Vision Assessment?: Wears glasses for reading     Perception Perception: Not tested       Praxis Praxis: Not tested       Pertinent Vitals/Pain Pain Assessment Pain Assessment: Faces Pain Score: 4  Faces Pain Scale: Hurts little more Pain Location: bil feet Pain  Descriptors / Indicators: Burning Pain Intervention(s): Limited activity within patient's tolerance, Monitored during session, Repositioned     Extremity/Trunk Assessment Upper Extremity Assessment Upper Extremity Assessment: Generalized weakness   Lower Extremity Assessment Lower Extremity Assessment: Defer to PT evaluation   Cervical / Trunk Assessment Cervical / Trunk Assessment: Other exceptions (body habitus)   Communication Communication Communication: No apparent difficulties   Cognition Arousal: Alert Behavior During Therapy: WFL for tasks assessed/performed Overall Cognitive Status: Within Functional Limits for tasks assessed                                       General Comments  VSS on RA, replaced tele leads as they fell off with bed mobility    Exercises     Shoulder Instructions      Home Living Family/patient expects to be discharged to:: Private residence Living Arrangements: Children;Other relatives (daughter, son in law, granddaughter) Available Help at Discharge: Family;Available 24 hours/day Type of Home: House Home Access: Stairs to enter [employer] of Steps: 6 Entrance Stairs-Rails: Right;Left Home Layout: One level     Bathroom Shower/Tub: Sponge bathes at baseline;Tub/shower unit   Bathroom Toilet: Standard Bathroom Accessibility: No   Home Equipment: Cane - quad;Cane - single point;Toilet riser;Wheelchair - manual          Prior Functioning/Environment Prior Level of Function : Needs assist             Mobility Comments: SPC/quad cane for household mobility, at times reports rentng a w/c ADLs Comments: sponge bathes at baseline, assist as needed for ADLs, manages own meds, does not drive -- reports not leaving her home much        OT Problem List: Decreased strength;Decreased range of motion;Decreased activity tolerance;Impaired balance (sitting and/or standing);Obesity      OT  Treatment/Interventions: Self-care/ADL training;Therapeutic exercise;Energy conservation;DME and/or AE instruction;Therapeutic activities;Patient/family education;Balance training    OT Goals(Current goals can be found in the care plan section) Acute Rehab OT Goals Patient Stated Goal: none stated OT Goal Formulation: With patient Time For Goal Achievement: 12/07/23 Potential to Achieve Goals: Good ADL Goals Pt Will Perform Upper Body Dressing: with supervision;sitting Pt Will Perform Lower Body Dressing: sit to/from stand;sitting/lateral leans;with adaptive equipment;with min assist Pt Will Transfer to Toilet: with supervision;ambulating;regular height toilet Additional ADL Goal #1: pt will perform bed mobility wtih supervision in prep for ADLs  OT Frequency: Min 1X/week    Co-evaluation              AM-PAC OT "6 Clicks" Daily Activity     Outcome Measure Help from another person eating meals?: A Little Help from another person taking care of personal grooming?: A Little Help from another person toileting, which includes using toliet, bedpan, or urinal?: A Lot Help from another person bathing (including washing, rinsing, drying)?: A Lot Help from another person to put on and taking off regular upper body clothing?: A Little Help from another person to put on and taking off regular lower body clothing?: A Lot 6 Click  Score: 15   End of Session Equipment Utilized During Treatment: Gait belt;Rolling [place] (2 wheels) Nurse Communication: Mobility status  Activity Tolerance: Patient tolerated treatment well Patient left: in bed;with call bell/phone within reach;with bed alarm set  OT Visit Diagnosis: Unsteadiness on feet (R26.81);Other abnormalities of gait and mobility (R26.89);Muscle weakness (generalized) (M62.81)                Time: 4098-1191 OT Time Calculation (min): 37 min Charges:  OT General Charges $OT Visit: 1 Visit OT Evaluation $OT Eval Moderate Complexity: 1  Mod OT Treatments $Therapeutic Activity: 8-22 mins  Mahreen Schewe K, OTD, OTR/L SecureChat Preferred Acute Rehab [phone]  Antionette Kirks 11/23/2023, 4:33 PM

## 2023-11-23 NOTE — Consult Note (Signed)
 WOC consulted for bilateral LE cellulitis, if topical care for wounds (if present) needed requested images of patients's LEs to be placed in the patient's chart.   Lizzete Gough [facility], CNS, [employer] [phone]

## 2023-11-23 NOTE — TOC Initial Note (Addendum)
 Transition of Care [facility]) - Initial/Assessment Note    Patient Details  Name: Monica Thompson MRN: [id number] Date of Birth: 01/25/1951  [facility]) CM/SW Contact:    Jennett Model, RN Phone Number: 11/23/2023, 2:01 PM  Clinical Narrative:                 From home with daughter, has PCP, Dr.shah and insurance on file, states has no HH services in place at this time , has cane x 2 and w/chair at home.  States family member will transport them home at [employer] and family is support system, states gets medications from [place].    Pta self ambulatory with cane.  Looks like pt rec SNF.  Expected Discharge Plan: Home/Self Care Barriers to Discharge: No Barriers Identified   Patient Goals and CMS Choice Patient states their goals for this hospitalization and ongoing recovery are:: return home   Choice offered to / list presented to : NA      Expected Discharge Plan and Services In-house Referral: NA Discharge Planning Services: CM Consult Post Acute Care Choice: NA Living arrangements for the past 2 months: Single Family Home Expected Discharge Date: 11/23/23               DME Arranged: N/A DME Agency: NA                  Prior Living Arrangements/Services Living arrangements for the past 2 months: Single Family Home Lives with:: Adult Children (daughter) Patient language and need for interpreter reviewed:: Yes Do you feel safe going back to the place where you live?: Yes      Need for Family Participation in Patient Care: Yes (Comment) Care giver support system in place?: Yes (comment) Current home services: DME (cane x2 , w/chair) Criminal Activity/Legal Involvement Pertinent to Current Situation/Hospitalization: No - Comment as needed  Activities of Daily Living      Permission Sought/Granted Permission sought to share information with : Case Manager Permission granted to share information with : Yes, Verbal Permission Granted               Emotional Assessment Appearance:: Appears stated age Attitude/Demeanor/Rapport: Engaged Affect (typically observed): Appropriate Orientation: : Oriented to Self, Oriented to Place, Oriented to  Time, Oriented to Situation Alcohol  / Substance Use: Not Applicable Psych Involvement: No (comment)  Admission diagnosis:  SVT (supraventricular tachycardia) (HCC) [I47.10] PVD (peripheral vascular disease) (HCC) [I73.9] Tachycardia [R00.0] Hyperglycemia [R73.9] Chest pain, unspecified type [R07.9] Patient Active Problem List   Diagnosis Date Noted   Type 2 diabetes mellitus with chronic kidney disease, without long-term current use of insulin  (HCC) 11/23/2023   SVT (supraventricular tachycardia) (HCC) 11/22/2023   Hyperglycemia 11/22/2023   Obesity, Class III, BMI 40-49.9 (morbid obesity) (HCC) 12/05/2014   COPD (chronic obstructive pulmonary disease) (HCC)    Hypertension    Hyperlipidemia    CAD S/P percutaneous coronary angioplasty 11/13/2014   PCP:  Theoplis Fix, MD Pharmacy:   [facility] [place], [place] - [address] [place] [address] Phone: [phone] Fax: [phone]     Social Drivers of Health (SDOH) Social History: SDOH Screenings   Tobacco Use: Medium Risk (11/22/2023)   SDOH Interventions:     Readmission Risk Interventions     No data to display

## 2023-11-24 DIAGNOSIS — I471 Supraventricular tachycardia, unspecified: Secondary | ICD-10-CM | POA: Diagnosis not present

## 2023-11-24 LAB — CBC
HCT: 39.1 % (ref 36.0–46.0)
Hemoglobin: 12.7 g/dL (ref 12.0–15.0)
MCH: 29.3 pg (ref 26.0–34.0)
MCHC: 32.5 g/dL (ref 30.0–36.0)
MCV: 90.1 fL (ref 80.0–100.0)
Platelets: 283 10*3/uL (ref 150–400)
RBC: 4.34 MIL/uL (ref 3.87–5.11)
RDW: 13.3 % (ref 11.5–15.5)
WBC: 6.8 10*3/uL (ref 4.0–10.5)
nRBC: 0 % (ref 0.0–0.2)

## 2023-11-24 LAB — BASIC METABOLIC PANEL
Anion gap: 7 (ref 5–15)
BUN: 24 mg/dL — ABNORMAL HIGH (ref 8–23)
CO2: 28 mmol/L (ref 22–32)
Calcium: 8.7 mg/dL — ABNORMAL LOW (ref 8.9–10.3)
Chloride: 101 mmol/L (ref 98–111)
Creatinine, Ser: 0.96 mg/dL (ref 0.44–1.00)
GFR, Estimated: >60 mL/min (ref >60)
Glucose, Bld: 300 mg/dL — ABNORMAL HIGH (ref 70–99)
Potassium: 4.8 mmol/L (ref 3.5–5.1)
Sodium: 136 mmol/L (ref 135–145)

## 2023-11-24 LAB — GLUCOSE, CAPILLARY
Glucose-Capillary: 228 mg/dL — ABNORMAL HIGH (ref 70–99)
Glucose-Capillary: 243 mg/dL — ABNORMAL HIGH (ref 70–99)
Glucose-Capillary: 279 mg/dL — ABNORMAL HIGH (ref 70–99)
Glucose-Capillary: 285 mg/dL — ABNORMAL HIGH (ref 70–99)

## 2023-11-24 LAB — MAGNESIUM: Magnesium: 2 mg/dL (ref 1.7–2.4)

## 2023-11-24 MED ORDER — LIVING WELL WITH DIABETES BOOK
Freq: Once | Status: AC
  Filled 2023-11-24: qty 1

## 2023-11-24 MED ORDER — HYDROCERIN EX CREA
TOPICAL_CREAM | Freq: Two times a day (BID) | CUTANEOUS | Status: DC
  Administered 2023-11-25: 1 via TOPICAL
  Filled 2023-11-24: qty 113

## 2023-11-24 NOTE — Progress Notes (Addendum)
PROGRESS NOTE  RAIMA GEATHERS NWG:[id number] DOB: 07-04-1951 DOA: 11/22/2023 PCP: Kirstie Peri, MD   LOS: 0 days   Brief narrative:  Same day note  73 year old female with past medical history of coronary artery disease status post stent, morbid obesity, COPD, hypertension who has not seen a physician in at least 4 to 5 years presented to hospital with shortness of breath.  EMS was called in and was noted to be in supraventricular tachycardia.  She got 3 vagal maneuvers improved sinus tachycardia and was brought into the hospital.  In the ED, patient was tachycardic.  Labs showed the hyperglycemia.  BNP 71.  COVID influenza and RSV was negative.  Troponin is within negative.  Chest x-ray was negative for any infiltrate.  Patient was then [facility] for further evaluation and treatment.    Assessment/Plan: Principal Problem:   SVT (supraventricular tachycardia) (HCC) Active Problems:   COPD (chronic obstructive pulmonary disease) (HCC)   Hyperlipidemia   Obesity, Class III, BMI 40-49.9 (morbid obesity) (HCC)   CAD S/P percutaneous coronary angioplasty   Hyperglycemia   Type 2 diabetes mellitus with chronic kidney disease, without long-term current use of insulin (HCC)   SVT (supraventricular tachycardia)  Resolved. Troponin negative.  TSH of 2.8..  Will continue low dose toprol-XL.   2D echocardiogram with LV ejection fraction of 55 to 60% with grade 1 diastolic dysfunction.   CAD S/P percutaneous coronary angioplasty Pt takes no regular medications.  Continue aspirin, metoprolol   Obesity, Class III, BMI 40-49.9 (morbid obesity)  Would benefit from weight loss as outpatient.  TSH of 2.8.   Hyperlipidemia Pt takes no regular medications.  Will take fasting lipid profile   COPD (chronic obstructive pulmonary disease) Stable.   Hyperglycemia likely new onset diabetes. Hemoglobin A1c of 10.4.   Patient was extensively counseled regarding this.  Diabetic coordinator was  consulted.  Recommended insulin regimen for now.  Patient's daughter does not wish her to be on insulin for long-term  DVT prophylaxis: heparin injection 5,000 Units Start: 11/22/23 0600 SCDs Start: 11/22/23 0540   Disposition: Skilled nursing facility.  Medically stable for disposition.  Status is: Observation  The patient will require care spanning > 2 midnights and should be moved to inpatient because: Need for skilled nursing facility, new diabetes, closer monitoring.    Code Status:     Code Status: Full Code  Family Communication:  Spoke with the patient's daughter on the phone and updated her on 11/23/2023  Consultants: None  Procedures: None  Anti-infectives:  None  Anti-infectives (From admission, onward)    None      Subjective: Today, patient was seen and examined at bedside.  Complains of mild burning sensation of her feet.  Denies any nausea, vomiting, fever, chills or rigor.   Objective: Vitals:   11/24/23 0743 11/24/23 0915  BP: (!) 153/60 (!) 153/60  Pulse: 68 68  Resp: 19   Temp: 97.7 F (36.5 C)   SpO2: 94%     Intake/Output Summary (Last 24 hours) at 11/24/2023 1037 Last data filed at 11/24/2023 0650 Gross per 24 hour  Intake 940 ml  Output 400 ml  Net 540 ml   There were no vitals filed for this visit. There is no height or weight on file to calculate BMI.   Physical Exam:  GENERAL: Patient is alert awake and oriented. Not in obvious distress.  Obese. HENT: No scleral pallor or icterus. Pupils equally reactive to light. Oral mucosa is moist  NECK: is supple, no gross swelling noted. CHEST: No crackles or wheezes.  Diminished breath sounds bilaterally. CVS: S1 and S2 heard, no murmur. Regular rate and rhythm.  ABDOMEN: Soft, non-tender, bowel sounds are present. EXTREMITIES: Erythema of the bilateral foot with some scaling. CNS: Cranial nerves are intact. No focal motor deficits. SKIN: warm and dry bilateral lower extremity erythema  with some scaling.  Data Review: I have personally reviewed the following laboratory data and studies,  CBC: Recent Labs  Lab 11/22/23 0212 11/24/23 0229  WBC 8.8 6.8  NEUTROABS 6.2  --   HGB 15.4* 12.7  HCT 46.8* 39.1  MCV 89.1 90.1  PLT 341 283   Basic Metabolic Panel: Recent Labs  Lab 11/22/23 0212 11/24/23 0229  NA 137 136  K 4.3 4.8  CL 103 101  CO2 22 28  GLUCOSE 403* 300*  BUN 23 24*  CREATININE 1.00 0.96  CALCIUM 9.2 8.7*  MG  --  2.0   Liver Function Tests: No results for input(s): "AST", "ALT", "ALKPHOS", "BILITOT", "PROT", "ALBUMIN" in the last 168 hours. No results for input(s): "LIPASE", "AMYLASE" in the last 168 hours. No results for input(s): "AMMONIA" in the last 168 hours. Cardiac Enzymes: No results for input(s): "CKTOTAL", "CKMB", "CKMBINDEX", "TROPONINI" in the last 168 hours. BNP (last 3 results) Recent Labs    11/22/23 0212  BNP 76.3    ProBNP (last 3 results) No results for input(s): "PROBNP" in the last 8760 hours.  CBG: Recent Labs  Lab 11/23/23 1008 11/23/23 1109 11/23/23 1546 11/23/23 2151 11/24/23 0623  GLUCAP 205* 238* 271* 267* 228*   Recent Results (from the past 240 hours)  Resp panel by RT-PCR (RSV, Flu A&B, Covid) Anterior Nasal Swab     Status: None   Collection Time: 11/22/23  2:15 AM   Specimen: Anterior Nasal Swab  Result Value Ref Range Status   SARS Coronavirus 2 by RT PCR NEGATIVE NEGATIVE Final   Influenza A by PCR NEGATIVE NEGATIVE Final   Influenza B by PCR NEGATIVE NEGATIVE Final    Comment: (NOTE) The Xpert Xpress SARS-CoV-2/FLU/RSV plus assay is intended as an aid in the diagnosis of influenza from Nasopharyngeal swab specimens and should not be used as a sole basis for treatment. Nasal washings and aspirates are unacceptable for Xpert Xpress SARS-CoV-2/FLU/RSV testing.  Fact Sheet for Patients: BloggerCourse.com  Fact Sheet for Healthcare  Providers: SeriousBroker.it  This test is not yet approved or cleared by the [place] FDA and has been authorized for detection and/or diagnosis of SARS-CoV-2 by FDA under an Emergency Use Authorization (EUA). This EUA will remain in effect (meaning this test can be used) for the duration of the COVID-19 declaration under Section 564(b)(1) of the Act, 21 U.S.C. section 360bbb-3(b)(1), unless the authorization is terminated or revoked.     Resp Syncytial Virus by PCR NEGATIVE NEGATIVE Final    Comment: (NOTE) Fact Sheet for Patients: BloggerCourse.com  Fact Sheet for Healthcare Providers: SeriousBroker.it  This test is not yet approved or cleared by the [place] FDA and has been authorized for detection and/or diagnosis of SARS-CoV-2 by FDA under an Emergency Use Authorization (EUA). This EUA will remain in effect (meaning this test can be used) for the duration of the COVID-19 declaration under Section 564(b)(1) of the Act, 21 U.S.C. section 360bbb-3(b)(1), unless the authorization is terminated or revoked.  Performed at [facility], [address], [place], [place] [address]      Studies: ECHOCARDIOGRAM COMPLETE  Result Date: 11/22/2023    ECHOCARDIOGRAM REPORT   Patient Name:   NICEY KRAH Date of Exam: 11/22/2023 Medical Rec #:  [id number]       Height:       64.0 in Accession #:    [id number]      Weight:       290.0 lb Date of Birth:  10-Oct-1951       BSA:          2.291 m Patient Age:    72 years        BP:           106/93 mmHg Patient Gender: F               HR:           70 bpm. Exam Location:  Inpatient Procedure: 2D Echo, Intracardiac Opacification Agent, Color Doppler and Cardiac            Doppler Indications:    supraventricular tachycardia (svt)  History:        Patient has prior history of Echocardiogram examinations, most                 recent 11/23/2014. CAD, COPD,  Arrythmias:NSTEMI; Risk                 Factors:Hypertension, Dyslipidemia and Current Smoker.  Sonographer:    Raeford Razor RDCS Referring Phys: [phone] ERIC CHEN  Sonographer Comments: Technically difficult study due to poor echo windows, no subcostal window and patient is obese. IMPRESSIONS  1. Images are limited.  2. Left ventricular ejection fraction, by estimation, is 55 to 60%. The left ventricle has normal function. The left ventricle has no regional wall motion abnormalities. Left ventricular diastolic parameters are consistent with Grade I diastolic dysfunction (impaired relaxation).  3. Right ventricular systolic function is low normal. The right ventricular size is normal. Tricuspid regurgitation signal is inadequate for assessing PA pressure.  4. A small pericardial effusion is present. The pericardial effusion is posterior to the left ventricle.  5. The mitral valve is grossly normal. Trivial mitral valve regurgitation.  6. The aortic valve was not well visualized. Aortic valve regurgitation is not visualized. Comparison(s): Prior images unable to be directly viewed. FINDINGS  Left Ventricle: Left ventricular ejection fraction, by estimation, is 55 to 60%. The left ventricle has normal function. The left ventricle has no regional wall motion abnormalities. The left ventricular internal cavity size was normal in size. There is  no left ventricular hypertrophy. Left ventricular diastolic parameters are consistent with Grade I diastolic dysfunction (impaired relaxation). Right Ventricle: The right ventricular size is normal. Right vetricular wall thickness was not well visualized. Right ventricular systolic function is low normal. Tricuspid regurgitation signal is inadequate for assessing PA pressure. Left Atrium: Left atrial size was normal in size. Right Atrium: Right atrial size was normal in size. Pericardium: A small pericardial effusion is present. The pericardial effusion is posterior to the left  ventricle. Presence of epicardial fat layer. Mitral Valve: The mitral valve is grossly normal. Mild mitral annular calcification. Trivial mitral valve regurgitation. Tricuspid Valve: The tricuspid valve is grossly normal. Tricuspid valve regurgitation is trivial. Aortic Valve: The aortic valve was not well visualized. Aortic valve regurgitation is not visualized. Pulmonic Valve: The pulmonic valve was grossly normal. Pulmonic valve regurgitation is trivial. Aorta: The aortic root and ascending aorta are structurally normal, with no evidence of dilitation. IAS/Shunts: The interatrial septum was not well visualized.  LEFT VENTRICLE PLAX 2D LVIDd:         4.90 cm      Diastology LVIDs:         3.10 cm      LV e' medial:    3.05 cm/s LV PW:         1.00 cm      LV E/e' medial:  20.7 LV IVS:        0.80 cm      LV e' lateral:   8.70 cm/s                             LV E/e' lateral: 7.3  LV Volumes (MOD) LV vol d, MOD A2C: 96.3 ml LV vol d, MOD A4C: 137.0 ml LV vol s, MOD A2C: 35.9 ml LV vol s, MOD A4C: 47.3 ml LV SV MOD A2C:     60.4 ml LV SV MOD A4C:     137.0 ml LV SV MOD BP:      77.2 ml RIGHT VENTRICLE RV Basal diam:  3.20 cm RV S prime:     12.60 cm/s TAPSE (M-mode): 2.0 cm LEFT ATRIUM             Index        RIGHT ATRIUM           Index LA diam:        4.70 cm 2.05 cm/m   RA Area:     17.40 cm LA Vol (A2C):   59.5 ml 25.97 ml/m  RA Volume:   43.90 ml  19.16 ml/m LA Vol (A4C):   34.3 ml 14.97 ml/m LA Biplane Vol: 50.3 ml 21.96 ml/m  AORTIC VALVE LVOT Vmax:   88.30 cm/s  AORTA Ao Root diam: 2.90 cm Ao Asc diam:  3.20 cm MITRAL VALVE MV Area (PHT): 2.66 cm MV Decel Time: 285 msec MV E velocity: 63.20 cm/s MV A velocity: 70.20 cm/s MV E/A ratio:  0.90 Nona Dell MD Electronically signed by Nona Dell MD Signature Date/Time: 11/22/2023/4:21:29 PM    Final       Joycelyn Das, MD  [facility] 11/24/2023  If 7PM-7AM, please contact night-coverage

## 2023-11-24 NOTE — Plan of Care (Signed)

## 2023-11-24 NOTE — TOC Progression Note (Addendum)
Transition of Care [facility]) - Progression Note    Patient Details  Name: Monica Thompson MRN: [id number] Date of Birth: 11/07/1950  Transition of Care [facility]) CM/SW Contact  Michaela Corner, [place] Phone Number: 11/24/2023, 2:55 PM  Clinical Narrative:   CSW spoke with [name], pts dtr, about bed offers at this time. CSW explained ins auth process and [name] expressed verbal understanding. CSW will email [employer] of [email or web address] rated bed offers.  4:05 PM CSW went to pts room to present [email or web address] rated accepting bed offers. Upon entry, pt was sleeping and CSW left list of facilities on pts bedside table.   TOC will continue to follow.   Expected Discharge Plan: Skilled Nursing Facility Barriers to Discharge: Continued Medical Work up  Expected Discharge Plan and Services In-house Referral: NA Discharge Planning Services: CM Consult Post Acute Care Choice: NA Living arrangements for the past 2 months: Single Family Home Expected Discharge Date: 11/23/23               DME Arranged: N/A DME Agency: NA                   Social Determinants of Health (SDOH) Interventions SDOH Screenings   Food Insecurity: No Food Insecurity (11/23/2023)  Housing: Low Risk  (11/23/2023)  Transportation Needs: No Transportation Needs (11/23/2023)  Utilities: Not At Risk (11/23/2023)  Social Connections: Socially Isolated (11/23/2023)  Tobacco Use: Medium Risk (11/22/2023)    Readmission Risk Interventions     No data to display

## 2023-11-24 NOTE — Plan of Care (Signed)
Problem: Education: Goal: Ability to describe self-care measures that may prevent or decrease complications (Diabetes Survival Skills Education) will improve Outcome: Progressing Goal: Individualized Educational Video(s) Outcome: Progressing   Problem: Coping: Goal: Ability to adjust to condition or change in health will improve Outcome: Progressing

## 2023-11-24 NOTE — Inpatient Diabetes Management (Addendum)
Inpatient Diabetes Program Recommendations  AACE/ADA: New Consensus Statement on Inpatient Glycemic Control (2015)  Target Ranges:  Prepandial:   less than 140 mg/dL      Peak postprandial:   less than 180 mg/dL (1-2 hours)      Critically ill patients:  140 - 180 mg/dL   Lab Results  Component Value Date   GLUCAP 228 (H) 11/24/2023   HGBA1C 10.4 (H) 11/22/2023    Review of Glycemic Control  Latest Reference Range & Units 11/23/23 10:08 11/23/23 11:09 11/23/23 15:46 11/23/23 21:51 11/24/23 06:23  Glucose-Capillary 70 - 99 mg/dL 454 (H) 098 (H) 119 (H) 267 (H) 228 (H)   Diabetes history: New Diabetes Diagnosis  Current orders for Inpatient glycemic control:  Semglee 10 units Novolog 0-20 units tid + hs  A1c 10.4% this admission  Inpatient Diabetes Program Recommendations:   -   Increase Semglee to 20 units (0.2 units/kg)  Discharge Recommendations: Other recommendations: Freestyle Libre 3 Sensor order # [id number], Reader order # [id number] Long acting recommendations: Insulin Glargine (LANTUS) Solostar Pen 20 units Daily  Supply/Referral recommendations: Glucometer Test strips Lancet device Lancets Pen needles - standard   Use Adult Diabetes Insulin Treatment Post Discharge order set.  Copay for injectables are high will need to discuss with pt and daughters.  -   Ozempic has been APPROVED  Copay is $482.83   Will follow up with pt again today.  Went to see pt again and explained about her needing insulin and that the copay for Ozempic was >$400. Pt reports that is too much to pay for it. Did mention to pt that her CGM would be covered by insurance with $0 copay. Gave pt sample of freestyle libre to take home with instructions on how to apply and download and operate the CGM.  Thanks,  Christena Deem RN, MSN, BC-ADM Inpatient Diabetes Coordinator Team Pager [phone] (8a-5p)

## 2023-11-25 DIAGNOSIS — I471 Supraventricular tachycardia, unspecified: Secondary | ICD-10-CM | POA: Diagnosis not present

## 2023-11-25 LAB — BASIC METABOLIC PANEL
Anion gap: 12 (ref 5–15)
BUN: 20 mg/dL (ref 8–23)
CO2: 23 mmol/L (ref 22–32)
Calcium: 8.7 mg/dL — ABNORMAL LOW (ref 8.9–10.3)
Chloride: 100 mmol/L (ref 98–111)
Creatinine, Ser: 0.71 mg/dL (ref 0.44–1.00)
GFR, Estimated: >60 mL/min (ref >60)
Glucose, Bld: 262 mg/dL — ABNORMAL HIGH (ref 70–99)
Potassium: 4.2 mmol/L (ref 3.5–5.1)
Sodium: 135 mmol/L (ref 135–145)

## 2023-11-25 LAB — LIPID PANEL
Cholesterol: 239 mg/dL — ABNORMAL HIGH (ref 0–200)
HDL: 37 mg/dL — ABNORMAL LOW (ref >40)
LDL Cholesterol: UNDETERMINED mg/dL (ref 0–99)
Total CHOL/HDL Ratio: 6.5 {ratio}
Triglycerides: 434 mg/dL — ABNORMAL HIGH (ref <150)
VLDL: UNDETERMINED mg/dL (ref 0–40)

## 2023-11-25 LAB — CBC
HCT: 40.5 % (ref 36.0–46.0)
Hemoglobin: 13.1 g/dL (ref 12.0–15.0)
MCH: 28.7 pg (ref 26.0–34.0)
MCHC: 32.3 g/dL (ref 30.0–36.0)
MCV: 88.6 fL (ref 80.0–100.0)
Platelets: 290 10*3/uL (ref 150–400)
RBC: 4.57 MIL/uL (ref 3.87–5.11)
RDW: 13.2 % (ref 11.5–15.5)
WBC: 6.4 10*3/uL (ref 4.0–10.5)
nRBC: 0 % (ref 0.0–0.2)

## 2023-11-25 LAB — GLUCOSE, CAPILLARY
Glucose-Capillary: 229 mg/dL — ABNORMAL HIGH (ref 70–99)
Glucose-Capillary: 234 mg/dL — ABNORMAL HIGH (ref 70–99)
Glucose-Capillary: 224 mg/dL — ABNORMAL HIGH (ref 70–99)
Glucose-Capillary: 281 mg/dL — ABNORMAL HIGH (ref 70–99)

## 2023-11-25 LAB — LDL CHOLESTEROL, DIRECT: Direct LDL: 162 mg/dL — ABNORMAL HIGH (ref 0–99)

## 2023-11-25 MED ORDER — MICONAZOLE NITRATE 2 % EX CREA
TOPICAL_CREAM | Freq: Two times a day (BID) | CUTANEOUS | Status: DC
  Administered 2023-11-25 – 2023-11-26 (×2): 1 via TOPICAL
  Filled 2023-11-25 (×2): qty 28.4

## 2023-11-25 MED ORDER — METOPROLOL SUCCINATE ER 25 MG PO TB24
25.0000 mg | ORAL_TABLET | Freq: Every day | ORAL | Status: DC
  Administered 2023-11-25 – 2023-11-27 (×3): 25 mg via ORAL
  Filled 2023-11-25 (×3): qty 1

## 2023-11-25 MED ORDER — GABAPENTIN 100 MG PO CAPS
100.0000 mg | ORAL_CAPSULE | Freq: Every day | ORAL | Status: DC
  Administered 2023-11-25 – 2023-11-26 (×2): 100 mg via ORAL
  Filled 2023-11-25 (×2): qty 1

## 2023-11-25 MED ORDER — ATORVASTATIN CALCIUM 10 MG PO TABS
20.0000 mg | ORAL_TABLET | Freq: Every day | ORAL | Status: DC
  Administered 2023-11-25 – 2023-11-27 (×3): 20 mg via ORAL
  Filled 2023-11-25 (×3): qty 2

## 2023-11-25 NOTE — TOC Progression Note (Addendum)
Transition of Care [facility]) - Progression Note    Patient Details  Name: Monica Thompson MRN: [id number] Date of Birth: 18-Dec-1950  [facility] CM/SW Contact  Michaela Corner, [place] Phone Number: 11/25/2023, 10:32 AM  Clinical Narrative:   CSW spoke with [name], pts dtr, about SNF choice. Family has chosen [facility] at this time.   10:33 AM CSW messaged PT for updated note for ins auth.   11:17AM CSW met pt at bedside, she was on the phone with her dtr [name], and spoke with them about [name]'s choice for SNF ([facility]). [name] stated the family is okay with [facility] and pt agreed as well. CSW explained insurance auth process. [name] asked about private room availability for pt and CSW reached out to [facility] to ask, awaiting response.   Pt stated she use to have Medicaid and is not sure if it is still active, if not she would like to reapply. CSW reached out to financial counseling to check in.   12:34 PM Per [name], they have a private room available today. CSW is awaiting updated PT note to submit for ins auth.   3:20 PM  Ins auth approved; Certification number [id number]; Dates approved 11/27/23 - 12/03/2023.  3:28 PM CSW called [name] and informed her that pts ins Berkley Harvey has been approved. CSW informed [name] that per financial counseling pt has family planning medicaid (id # [id number]).   TOC will continue to follow.   Expected Discharge Plan: Skilled Nursing Facility Barriers to Discharge: Continued Medical Work up  Expected Discharge Plan and Services In-house Referral: NA Discharge Planning Services: CM Consult Post Acute Care Choice: NA Living arrangements for the past 2 months: Single Family Home Expected Discharge Date: 11/23/23               DME Arranged: N/A DME Agency: NA                   Social Determinants of Health (SDOH) Interventions SDOH Screenings   Food Insecurity: No Food Insecurity (11/23/2023)  Housing:  Low Risk  (11/23/2023)  Transportation Needs: No Transportation Needs (11/23/2023)  Utilities: Not At Risk (11/23/2023)  Social Connections: Socially Isolated (11/23/2023)  Tobacco Use: Medium Risk (11/22/2023)    Readmission Risk Interventions     No data to display

## 2023-11-25 NOTE — Progress Notes (Signed)
PROGRESS NOTE  Monica Thompson:[id number] DOB: [birth year] DOA: 11/22/2023 PCP: Kirstie Peri, MD   LOS: 0 days   Brief narrative:  Same day note  73 year old female with past medical history of coronary artery disease status post stent, morbid obesity, COPD, hypertension who has not seen a physician in at least 4 to 5 years presented to hospital with shortness of breath.  EMS was called in and was noted to be in supraventricular tachycardia.  She got 3 vagal maneuvers improved sinus tachycardia and was brought into the hospital.  In the ED, patient was tachycardic.  Labs showed the hyperglycemia.  BNP 73.  COVID influenza and RSV was negative.  Troponin is within negative.  Chest x-ray was negative for any infiltrate.  Patient was then [facility] for further evaluation and treatment.    Assessment/Plan: Principal Problem:   SVT (supraventricular tachycardia) (HCC) Active Problems:   COPD (chronic obstructive pulmonary disease) (HCC)   Hyperlipidemia   Obesity, Class III, BMI 40-49.9 (morbid obesity) (HCC)   CAD S/P percutaneous coronary angioplasty   Hyperglycemia   Type 2 diabetes mellitus with chronic kidney disease, without long-term current use of insulin (HCC)   SVT (supraventricular tachycardia)  Resolved. Troponin negative.  TSH of 2.8..  Will continue low dose toprol-XL.   2D echocardiogram with LV ejection fraction of 55 to 60% with grade 1 diastolic dysfunction.   CAD S/P percutaneous coronary angioplasty Pt takes no regular medications.  Continue aspirin, metoprolol   Obesity, Class III, BMI 40-49.9 (morbid obesity)  Would benefit from weight loss as outpatient.  TSH of 2.8.   Hyperlipidemia Pt takes no regular medications.  Will take fasting lipid profile   COPD (chronic obstructive pulmonary disease) Stable.   Hyperglycemia likely new onset diabetes. Hemoglobin A1c of 10.4.   Patient was extensively counseled regarding this.  Diabetic coordinator was  consulted.  Recommended insulin regimen for now.  Patient's daughter does not wish her to be on insulin for long-term  Bilateral redness of feet with scaling.  Neuropathy. Will add fungal cream and eucerin. Will see with progress  DVT prophylaxis: heparin injection 5,000 Units Start: 11/22/23 0600 SCDs Start: 11/22/23 0540   Disposition: Skilled nursing facility.  Medically stable for disposition.  Status is: Observation  The patient will require care spanning > 2 midnights and should be moved to inpatient because: Need for skilled nursing facility, new diabetes, closer monitoring.    Code Status:     Code Status: Full Code  Family Communication:  Spoke with the patient's daughter  on 11/23/2023  Consultants: None  Procedures: None  Anti-infectives:  None  Anti-infectives (From admission, onward)    None      Subjective: Today, patient was seen and examined at bedside.  Complains of mild understanding and burning around aches but better than yesterday.  Denies any shortness of breath chest pain dizziness lightheadedness or dyspnea.  \ Objective: Vitals:   11/25/23 0521 11/25/23 0736  BP: (!) 156/79 (!) 154/72  Pulse: 67 62  Resp: 15 20  Temp: 98.8 F (37.1 C) 97.9 F (36.6 C)  SpO2: 96% 96%    Intake/Output Summary (Last 24 hours) at 11/25/2023 1041 Last data filed at 11/25/2023 0800 Gross per 24 hour  Intake 480 ml  Output 800 ml  Net -320 ml   Filed Weights   11/25/23 0521  Weight: (!) 145.8 kg   Body mass index is 55.17 kg/m.   Physical Exam:  GENERAL: Patient is alert  awake and oriented. Not in obvious distress.  Obese. HENT: No scleral pallor or icterus. Pupils equally reactive to light. Oral mucosa is moist NECK: is supple, no gross swelling noted. CHEST: No crackles or wheezes.  Diminished breath sounds bilaterally. CVS: S1 and S2 heard, no murmur. Regular rate and rhythm.  ABDOMEN: Soft, non-tender, bowel sounds are  present. EXTREMITIES: Erythema of the bilateral foot with some scaling. CNS: Cranial nerves are intact. No focal motor deficits. SKIN: warm and dry, bilateral lower extremity erythema with some scaling.  Data Review: I have personally reviewed the following laboratory data and studies,  CBC: Recent Labs  Lab 11/22/23 0212 11/24/23 0229 11/25/23 0255  WBC 8.8 6.8 6.4  NEUTROABS 6.2  --   --   HGB 15.4* 12.7 13.1  HCT 46.8* 39.1 40.5  MCV 89.1 90.1 88.6  PLT 341 283 290   Basic Metabolic Panel: Recent Labs  Lab 11/22/23 0212 11/24/23 0229 11/25/23 0255  NA 137 136 135  K 4.3 4.8 4.2  CL 103 101 100  CO2 22 28 23   GLUCOSE 403* 300* 262*  BUN 23 24* 20  CREATININE 1.00 0.96 0.71  CALCIUM 9.2 8.7* 8.7*  MG  --  2.0  --    Liver Function Tests: No results for input(s): "AST", "ALT", "ALKPHOS", "BILITOT", "PROT", "ALBUMIN" in the last 168 hours. No results for input(s): "LIPASE", "AMYLASE" in the last 168 hours. No results for input(s): "AMMONIA" in the last 168 hours. Cardiac Enzymes: No results for input(s): "CKTOTAL", "CKMB", "CKMBINDEX", "TROPONINI" in the last 168 hours. BNP (last 3 results) Recent Labs    11/22/23 0212  BNP 76.3    ProBNP (last 3 results) No results for input(s): "PROBNP" in the last 8760 hours.  CBG: Recent Labs  Lab 11/24/23 0623 11/24/23 1128 11/24/23 1527 11/24/23 2057 11/25/23 0559  GLUCAP 228* 243* 279* 285* 229*   Recent Results (from the past 240 hours)  Resp panel by RT-PCR (RSV, Flu A&B, Covid) Anterior Nasal Swab     Status: None   Collection Time: 11/22/23  2:15 AM   Specimen: Anterior Nasal Swab  Result Value Ref Range Status   SARS Coronavirus 2 by RT PCR NEGATIVE NEGATIVE Final   Influenza A by PCR NEGATIVE NEGATIVE Final   Influenza B by PCR NEGATIVE NEGATIVE Final    Comment: (NOTE) The Xpert Xpress SARS-CoV-2/FLU/RSV plus assay is intended as an aid in the diagnosis of influenza from Nasopharyngeal swab  specimens and should not be used as a sole basis for treatment. Nasal washings and aspirates are unacceptable for Xpert Xpress SARS-CoV-2/FLU/RSV testing.  Fact Sheet for Patients: BloggerCourse.com  Fact Sheet for Healthcare Providers: SeriousBroker.it  This test is not yet approved or cleared by the [place] FDA and has been authorized for detection and/or diagnosis of SARS-CoV-2 by FDA under an Emergency Use Authorization (EUA). This EUA will remain in effect (meaning this test can be used) for the duration of the COVID-19 declaration under Section 564(b)(1) of the Act, 21 U.S.C. section 360bbb-3(b)(1), unless the authorization is terminated or revoked.     Resp Syncytial Virus by PCR NEGATIVE NEGATIVE Final    Comment: (NOTE) Fact Sheet for Patients: BloggerCourse.com  Fact Sheet for Healthcare Providers: SeriousBroker.it  This test is not yet approved or cleared by the [place] FDA and has been authorized for detection and/or diagnosis of SARS-CoV-2 by FDA under an Emergency Use Authorization (EUA). This EUA will remain in effect (meaning this test can be  used) for the duration of the COVID-19 declaration under Section 564(b)(1) of the Act, 21 U.S.C. section 360bbb-3(b)(1), unless the authorization is terminated or revoked.  Performed at [facility], [address], [place], [place] [address]      Studies: No results found.     Joycelyn Das, MD  [facility] 11/25/2023  If 7PM-7AM, please contact night-coverage

## 2023-11-25 NOTE — Plan of Care (Signed)
Problem: Education: Goal: Ability to describe self-care measures that may prevent or decrease complications (Diabetes Survival Skills Education) will improve 11/25/2023 1649 by Martie Round, RN Outcome: Progressing 11/25/2023 1648 by Martie Round, RN Outcome: Progressing 11/25/2023 1647 by Martie Round, RN Outcome: Progressing Goal: Individualized Educational Video(s) 11/25/2023 1649 by Martie Round, RN Outcome: Progressing 11/25/2023 1648 by Martie Round, RN Outcome: Progressing 11/25/2023 1647 by Martie Round, RN Outcome: Progressing   Problem: Coping: Goal: Ability to adjust to condition or change in health will improve 11/25/2023 1649 by Martie Round, RN Outcome: Progressing 11/25/2023 1648 by Martie Round, RN Outcome: Progressing 11/25/2023 1647 by Martie Round, RN Outcome: Progressing   Problem: Fluid Volume: Goal: Ability to maintain a balanced intake and output will improve 11/25/2023 1649 by Martie Round, RN Outcome: Progressing 11/25/2023 1648 by Martie Round, RN Outcome: Progressing 11/25/2023 1647 by Martie Round, RN Outcome: Progressing   Problem: Health Behavior/Discharge Planning: Goal: Ability to identify and utilize available resources and services will improve 11/25/2023 1649 by Martie Round, RN Outcome: Progressing 11/25/2023 1648 by Martie Round, RN Outcome: Progressing 11/25/2023 1647 by Martie Round, RN Outcome: Progressing Goal: Ability to manage health-related needs will improve 11/25/2023 1649 by Martie Round, RN Outcome: Progressing 11/25/2023 1648 by Martie Round, RN Outcome: Progressing 11/25/2023 1647 by Martie Round, RN Outcome: Progressing   Problem: Metabolic: Goal: Ability to maintain appropriate glucose levels will improve 11/25/2023 1649 by Martie Round, RN Outcome: Progressing 11/25/2023 1648 by Martie Round, RN Outcome: Progressing 11/25/2023 1647 by Martie Round, RN Outcome: Progressing   Problem: Nutritional: Goal: Maintenance of adequate nutrition will improve 11/25/2023 1649 by Martie Round, RN Outcome: Progressing 11/25/2023 1648 by Martie Round, RN Outcome: Progressing 11/25/2023 1647 by Martie Round, RN Outcome: Progressing Goal: Progress toward achieving an optimal weight will improve 11/25/2023 1649 by Martie Round, RN Outcome: Progressing 11/25/2023 1648 by Martie Round, RN Outcome: Progressing 11/25/2023 1647 by Martie Round, RN Outcome: Progressing   Problem: Skin Integrity: Goal: Risk for impaired skin integrity will decrease 11/25/2023 1649 by Martie Round, RN Outcome: Progressing 11/25/2023 1648 by Martie Round, RN Outcome: Progressing 11/25/2023 1647 by Martie Round, RN Outcome: Progressing   Problem: Tissue Perfusion: Goal: Adequacy of tissue perfusion will improve 11/25/2023 1649 by Martie Round, RN Outcome: Progressing 11/25/2023 1648 by Martie Round, RN Outcome: Progressing 11/25/2023 1647 by Martie Round, RN Outcome: Progressing   Problem: Education: Goal: Knowledge of General Education information will improve Description: Including pain rating scale, medication(s)/side effects and non-pharmacologic comfort measures 11/25/2023 1649 by Martie Round, RN Outcome: Progressing 11/25/2023 1648 by Martie Round, RN Outcome: Progressing 11/25/2023 1647 by Martie Round, RN Outcome: Progressing   Problem: Health Behavior/Discharge Planning: Goal: Ability to manage health-related needs will improve 11/25/2023 1649 by Martie Round, RN Outcome: Progressing 11/25/2023 1648 by Martie Round, RN Outcome: Progressing 11/25/2023 1647 by Martie Round, RN Outcome: Progressing   Problem: Clinical Measurements: Goal: Ability to maintain clinical measurements within normal limits will improve 11/25/2023 1649 by Martie Round, RN Outcome: Progressing 11/25/2023  1648 by Martie Round, RN Outcome: Progressing 11/25/2023 1647 by Martie Round, RN Outcome: Progressing Goal: Will remain free from infection 11/25/2023 1649 by Martie Round, RN Outcome: Progressing 11/25/2023 1648 by Martie Round, RN Outcome: Progressing 11/25/2023 1647 by  Martie Round, RN Outcome: Progressing Goal: Diagnostic test results will improve 11/25/2023 1649 by Martie Round, RN Outcome: Progressing 11/25/2023 1648 by Martie Round, RN Outcome: Progressing 11/25/2023 1647 by Martie Round, RN Outcome: Progressing Goal: Respiratory complications will improve 11/25/2023 1649 by Martie Round, RN Outcome: Progressing 11/25/2023 1648 by Martie Round, RN Outcome: Progressing 11/25/2023 1647 by Martie Round, RN Outcome: Progressing Goal: Cardiovascular complication will be avoided 11/25/2023 1649 by Martie Round, RN Outcome: Progressing 11/25/2023 1648 by Martie Round, RN Outcome: Progressing 11/25/2023 1647 by Martie Round, RN Outcome: Progressing   Problem: Activity: Goal: Risk for activity intolerance will decrease 11/25/2023 1649 by Martie Round, RN Outcome: Progressing 11/25/2023 1648 by Martie Round, RN Outcome: Progressing 11/25/2023 1647 by Martie Round, RN Outcome: Progressing   Problem: Nutrition: Goal: Adequate nutrition will be maintained 11/25/2023 1649 by Martie Round, RN Outcome: Progressing 11/25/2023 1648 by Martie Round, RN Outcome: Progressing 11/25/2023 1647 by Martie Round, RN Outcome: Progressing   Problem: Coping: Goal: Level of anxiety will decrease 11/25/2023 1649 by Martie Round, RN Outcome: Progressing 11/25/2023 1648 by Martie Round, RN Outcome: Progressing 11/25/2023 1647 by Martie Round, RN Outcome: Progressing   Problem: Elimination: Goal: Will not experience complications related to bowel motility 11/25/2023 1649 by Martie Round, RN Outcome:  Progressing 11/25/2023 1648 by Martie Round, RN Outcome: Progressing 11/25/2023 1647 by Martie Round, RN Outcome: Progressing Goal: Will not experience complications related to urinary retention 11/25/2023 1649 by Martie Round, RN Outcome: Progressing 11/25/2023 1648 by Martie Round, RN Outcome: Progressing 11/25/2023 1647 by Martie Round, RN Outcome: Progressing   Problem: Pain Managment: Goal: General experience of comfort will improve and/or be controlled 11/25/2023 1649 by Martie Round, RN Outcome: Progressing 11/25/2023 1648 by Martie Round, RN Outcome: Progressing 11/25/2023 1647 by Martie Round, RN Outcome: Progressing   Problem: Safety: Goal: Ability to remain free from injury will improve 11/25/2023 1649 by Martie Round, RN Outcome: Progressing 11/25/2023 1648 by Martie Round, RN Outcome: Progressing 11/25/2023 1647 by Martie Round, RN Outcome: Progressing   Problem: Skin Integrity: Goal: Risk for impaired skin integrity will decrease 11/25/2023 1649 by Martie Round, RN Outcome: Progressing 11/25/2023 1648 by Martie Round, RN Outcome: Progressing 11/25/2023 1647 by Martie Round, RN Outcome: Progressing

## 2023-11-25 NOTE — Progress Notes (Signed)
Physical Therapy Treatment Patient Details Name: Monica Thompson MRN: [id number] DOB: [birth year] Today's Date: 11/25/2023   History of Present Illness Pt is a 73 y/o F presentign to ED on 2/9 with SOB, upon EMS arrival pt noted to be in SVT, 3 vagal maneuvers improved sinus tachycardia. CXR negative, admitted for SVT. PMH includes CAD s/p stent 2016, NSTEMI, morbid obesity, COPD, HTN    PT Comments  Pt pleasant and agreeable to PT session. She was able to engage in gait training this session, completing three bouts of ~44ft using RW with MinA and a chair follow. Pt quickly fatigues with ambulation and would intermittently prop B forearms on RW. Facilitated seated rest and cued PLB to help pt with recovery. Will continue to follow acutely and advance functional mobility. Pt will benefit from post-acute rehab, <3hrs/day of therapy.    If plan is discharge home, recommend the following: Two people to help with walking and/or transfers;A lot of help with bathing/dressing/bathroom;Assistance with cooking/housework;Direct supervision/assist for medications management;Assist for transportation;Help with stairs or ramp for entrance   Can travel by private vehicle     Yes  Equipment Recommendations  Rolling Hilyard (2 wheels);Wheelchair (measurements PT);Wheelchair cushion (measurements PT);Hospital bed;BSC/3in1    Recommendations for Other Services       Precautions / Restrictions Precautions Precautions: Fall Restrictions Weight Bearing Restrictions Per Provider Order: No     Mobility  Bed Mobility Overal bed mobility: Needs Assistance Bed Mobility: Supine to Sit     Supine to sit: HOB elevated, Used rails, Min assist, Mod assist     General bed mobility comments: Pt required minA to bring BLE off bed, modA at trunk to achieve upright posture, and she scooted to EOB with BUE support.    Transfers Overall transfer level: Needs assistance Equipment used: Rolling Hobdy (2  wheels) Transfers: Sit to/from Stand Sit to Stand: Min assist           General transfer comment: Cued pt on hand placement and increased fwd lean prior to powering up. Good eccentric control observed with sitting.    Ambulation/Gait Ambulation/Gait assistance: Min assist Gait Distance (Feet): 15 Feet (x3, seated rest after each bout) Assistive device: Rolling Alberg (2 wheels) Gait Pattern/deviations: Step-to pattern, Wide base of support, Decreased weight shift to left Gait velocity: Decreased Gait velocity interpretation: <1.31 ft/sec, indicative of household ambulator   General Gait Details: Pt took short slow steps with a WBOS. She frequently would lean on RW so forearms were resting along grips and trunk was flex d/t fatigue. Encouraged seated rest breaks and cued PLB to help with recovery.   Stairs             Wheelchair Mobility     Tilt Bed    Modified Rankin (Stroke Patients Only)       Balance Overall balance assessment: Needs assistance Sitting-balance support: Bilateral upper extremity supported, Feet supported Sitting balance-Leahy Scale: Fair Sitting balance - Comments: Pt sat EOB with supervision.   Standing balance support: Bilateral upper extremity supported, Reliant on assistive device for balance, During functional activity Standing balance-Leahy Scale: Poor Standing balance comment: Pt relies on RW when standing and quickly fatigues. MinA for stability.                            Communication Communication Communication: No apparent difficulties  Cognition Arousal: Alert Behavior During Therapy: WFL for tasks assessed/performed   PT - Cognitive impairments: No  apparent impairments                         Following commands: Intact      Cueing Cueing Techniques: Verbal cues, Tactile cues  Exercises      General Comments General comments (skin integrity, edema, etc.): VSS on RA      Pertinent Vitals/Pain  Pain Assessment Pain Assessment: 0-10 Pain Score: 4  Pain Location: B hips Pain Descriptors / Indicators: Aching, Discomfort Pain Intervention(s): Monitored during session    Home Living                          Prior Function            PT Goals (current goals can now be found in the care plan section) Acute Rehab PT Goals Patient Stated Goal: Move easier Progress towards PT goals: Progressing toward goals    Frequency    Min 1X/week      PT Plan      Co-evaluation              AM-PAC PT "6 Clicks" Mobility   Outcome Measure  Help needed turning from your back to your side while in a flat bed without using bedrails?: A Little Help needed moving from lying on your back to sitting on the side of a flat bed without using bedrails?: A Lot Help needed moving to and from a bed to a chair (including a wheelchair)?: A Lot Help needed standing up from a chair using your arms (e.g., wheelchair or bedside chair)?: A Little Help needed to walk in hospital room?: Total Help needed climbing 3-5 steps with a railing? : Total 6 Click Score: 12    End of Session Equipment Utilized During Treatment: Gait belt Activity Tolerance: Patient limited by fatigue Patient left: in chair;with call bell/phone within reach;with chair alarm set Nurse Communication: Mobility status PT Visit Diagnosis: Other abnormalities of gait and mobility (R26.89)     Time: 2440-1027 PT Time Calculation (min) (ACUTE ONLY): 24 min  Charges:    $Gait Training: 23-37 mins PT General Charges $$ ACUTE PT VISIT: 1 Visit                     Cheri Guppy, PT, DPT Acute Rehabilitation Services Office: [phone] Secure Chat Preferred   Richardson Chiquito 11/25/2023, 2:33 PM

## 2023-11-26 ENCOUNTER — Observation Stay (HOSPITAL_BASED_OUTPATIENT_CLINIC_OR_DEPARTMENT_OTHER): Payer: Medicare HMO

## 2023-11-26 DIAGNOSIS — I471 Supraventricular tachycardia, unspecified: Secondary | ICD-10-CM | POA: Diagnosis not present

## 2023-11-26 DIAGNOSIS — I739 Peripheral vascular disease, unspecified: Secondary | ICD-10-CM

## 2023-11-26 LAB — GLUCOSE, CAPILLARY
Glucose-Capillary: 242 mg/dL — ABNORMAL HIGH (ref 70–99)
Glucose-Capillary: 259 mg/dL — ABNORMAL HIGH (ref 70–99)
Glucose-Capillary: 282 mg/dL — ABNORMAL HIGH (ref 70–99)
Glucose-Capillary: 162 mg/dL — ABNORMAL HIGH (ref 70–99)

## 2023-11-26 LAB — BASIC METABOLIC PANEL
Anion gap: 10 (ref 5–15)
BUN: 22 mg/dL (ref 8–23)
CO2: 25 mmol/L (ref 22–32)
Calcium: 8.8 mg/dL — ABNORMAL LOW (ref 8.9–10.3)
Chloride: 100 mmol/L (ref 98–111)
Creatinine, Ser: 1.04 mg/dL — ABNORMAL HIGH (ref 0.44–1.00)
GFR, Estimated: 56 mL/min — ABNORMAL LOW (ref >60)
Glucose, Bld: 243 mg/dL — ABNORMAL HIGH (ref 70–99)
Potassium: 4.3 mmol/L (ref 3.5–5.1)
Sodium: 135 mmol/L (ref 135–145)

## 2023-11-26 LAB — VAS US ABI WITH/WO TBI
Left ABI: 0.85
Right ABI: 0.94

## 2023-11-26 NOTE — Progress Notes (Signed)
ABI's have been completed. Preliminary results can be found in CV Proc through chart review.   11/26/23 11:18 AM Olen Cordial RVT

## 2023-11-26 NOTE — Progress Notes (Signed)
PROGRESS NOTE  Monica Thompson UJW:[id number] DOB: 1951-05-27 DOA: 11/22/2023 PCP: Kirstie Peri, MD   LOS: 0 days   Brief narrative:  Same day note  73 year old female with past medical history of coronary artery disease status post stent, morbid obesity, COPD, hypertension who has not seen a physician in at least 4 to 5 years presented to hospital with shortness of breath.  EMS was called in and was noted to be in supraventricular tachycardia.  She got 3 vagal maneuvers improved sinus tachycardia and was brought into the hospital.  In the ED, patient was tachycardic.  Labs showed the hyperglycemia.  BNP 73.  COVID influenza and RSV was negative.  Troponin is within negative.  Chest x-ray was negative for any infiltrate.     Assessment/Plan: Principal Problem:   SVT (supraventricular tachycardia) (HCC) Active Problems:   COPD (chronic obstructive pulmonary disease) (HCC)   Hyperlipidemia   Obesity, Class III, BMI 40-49.9 (morbid obesity) (HCC)   CAD S/P percutaneous coronary angioplasty   Hyperglycemia   Type 2 diabetes mellitus with chronic kidney disease, without long-term current use of insulin (HCC)   SVT (supraventricular tachycardia)  Resolved. Troponin negative.  TSH of 2.8.Marland Kitchen  Thinly with Toprol XL low-dose.  2D echocardiogram with LV ejection fraction of 55 to 60% with grade 1 diastolic dysfunction.   CAD S/P percutaneous coronary angioplasty Pt takes no regular medications.  Continue aspirin, metoprolol   Obesity, Class III, BMI 40-49.9 (morbid obesity)  Would benefit from weight loss as outpatient.  TSH of 2.8.   Hyperlipidemia Pt takes no regular medications.  Will take fasting lipid profile   COPD (chronic obstructive pulmonary disease) Stable.   Hyperglycemia likely new onset diabetes. Hemoglobin A1c of 10.4.  Long-acting insulin.  No recording it on board.  Bilateral redness of feet with scaling.  Neuropathy. Continue Eucerin fungal cream and low-dose  Neurontin at nighttime.  Patient states that it feels better now.  Will check ABI of the lower extremities.  DVT prophylaxis: heparin injection 5,000 Units Start: 11/22/23 0600 SCDs Start: 11/22/23 0540   Disposition: Skilled nursing facility.  Medically stable for disposition.  Status is: Observation  The patient will require care spanning > 2 midnights and should be moved to inpatient because: Need for skilled nursing facility, new diabetes, closer monitoring.    Code Status:     Code Status: Full Code  Family Communication:  Spoke with the patient's daughter  on 11/23/2023  Consultants: None  Procedures: None  Anti-infectives:  None  Anti-infectives (From admission, onward)    None      Subjective: Today, patient was seen and examined at bedside.  Complains of no chest pain or dyspnea.  Burning in the leg has gone down some. \ Objective: Vitals:   11/26/23 0807 11/26/23 0951  BP: 130/63 (!) 135/57  Pulse:  63  Resp: 12   Temp: 97.7 F (36.5 C)   SpO2:      Intake/Output Summary (Last 24 hours) at 11/26/2023 1007 Last data filed at 11/26/2023 0807 Gross per 24 hour  Intake --  Output 2000 ml  Net -2000 ml   Filed Weights   11/25/23 0521 11/26/23 0449  Weight: (!) 145.8 kg (!) 144.6 kg   Body mass index is 54.72 kg/m.   Physical Exam:  GENERAL: Patient is alert awake and oriented. Not in obvious distress.  Morbidly obese. HENT: No scleral pallor or icterus. Pupils equally reactive to light. Oral mucosa is moist NECK: is supple, no  gross swelling noted. CHEST: No crackles or wheezes.  Diminished breath sounds bilaterally. CVS: S1 and S2 heard, no murmur. Regular rate and rhythm.  ABDOMEN: Soft, non-tender, bowel sounds are present. EXTREMITIES: Erythema of the bilateral foot with some scaling. CNS: Cranial nerves are intact. No focal motor deficits. SKIN: warm and dry, bilateral lower extremity erythema with some scaling.  Cool to touch.  Data  Review: I have personally reviewed the following laboratory data and studies,  CBC: Recent Labs  Lab 11/22/23 0212 11/24/23 0229 11/25/23 0255  WBC 8.8 6.8 6.4  NEUTROABS 6.2  --   --   HGB 15.4* 12.7 13.1  HCT 46.8* 39.1 40.5  MCV 89.1 90.1 88.6  PLT 341 283 290   Basic Metabolic Panel: Recent Labs  Lab 11/22/23 0212 11/24/23 0229 11/25/23 0255 11/26/23 0303  NA 137 136 135 135  K 4.3 4.8 4.2 4.3  CL 103 101 100 100  CO2 22 28 23 25   GLUCOSE 403* 300* 262* 243*  BUN 23 24* 20 22  CREATININE 1.00 0.96 0.71 1.04*  CALCIUM 9.2 8.7* 8.7* 8.8*  MG  --  2.0  --   --    Liver Function Tests: No results for input(s): "AST", "ALT", "ALKPHOS", "BILITOT", "PROT", "ALBUMIN" in the last 168 hours. No results for input(s): "LIPASE", "AMYLASE" in the last 168 hours. No results for input(s): "AMMONIA" in the last 168 hours. Cardiac Enzymes: No results for input(s): "CKTOTAL", "CKMB", "CKMBINDEX", "TROPONINI" in the last 168 hours. BNP (last 3 results) Recent Labs    11/22/23 0212  BNP 76.3    ProBNP (last 3 results) No results for input(s): "PROBNP" in the last 8760 hours.  CBG: Recent Labs  Lab 11/25/23 0559 11/25/23 1155 11/25/23 1615 11/25/23 2055 11/26/23 0620  GLUCAP 229* 234* 224* 281* 242*   Recent Results (from the past 240 hours)  Resp panel by RT-PCR (RSV, Flu A&B, Covid) Anterior Nasal Swab     Status: None   Collection Time: 11/22/23  2:15 AM   Specimen: Anterior Nasal Swab  Result Value Ref Range Status   SARS Coronavirus 2 by RT PCR NEGATIVE NEGATIVE Final   Influenza A by PCR NEGATIVE NEGATIVE Final   Influenza B by PCR NEGATIVE NEGATIVE Final    Comment: (NOTE) The Xpert Xpress SARS-CoV-2/FLU/RSV plus assay is intended as an aid in the diagnosis of influenza from Nasopharyngeal swab specimens and should not be used as a sole basis for treatment. Nasal washings and aspirates are unacceptable for Xpert Xpress SARS-CoV-2/FLU/RSV testing.  Fact  Sheet for Patients: BloggerCourse.com  Fact Sheet for Healthcare Providers: SeriousBroker.it  This test is not yet approved or cleared by the [place] FDA and has been authorized for detection and/or diagnosis of SARS-CoV-2 by FDA under an Emergency Use Authorization (EUA). This EUA will remain in effect (meaning this test can be used) for the duration of the COVID-19 declaration under Section 564(b)(1) of the Act, 21 U.S.C. section 360bbb-3(b)(1), unless the authorization is terminated or revoked.     Resp Syncytial Virus by PCR NEGATIVE NEGATIVE Final    Comment: (NOTE) Fact Sheet for Patients: BloggerCourse.com  Fact Sheet for Healthcare Providers: SeriousBroker.it  This test is not yet approved or cleared by the [place] FDA and has been authorized for detection and/or diagnosis of SARS-CoV-2 by FDA under an Emergency Use Authorization (EUA). This EUA will remain in effect (meaning this test can be used) for the duration of the COVID-19 declaration under Section 564(b)(1)  of the Act, 21 U.S.C. section 360bbb-3(b)(1), unless the authorization is terminated or revoked.  Performed at [facility], [address], [place], [place] [address]      Studies: No results found.     Joycelyn Das, MD  [facility] 11/26/2023  If 7PM-7AM, please contact night-coverage

## 2023-11-26 NOTE — Plan of Care (Signed)

## 2023-11-26 NOTE — Inpatient Diabetes Management (Signed)
Inpatient Diabetes Program Recommendations  AACE/ADA: New Consensus Statement on Inpatient Glycemic Control (2015)  Target Ranges:  Prepandial:   less than 140 mg/dL      Peak postprandial:   less than 180 mg/dL (1-2 hours)      Critically ill patients:  140 - 180 mg/dL   Lab Results  Component Value Date   GLUCAP 242 (H) 11/26/2023   HGBA1C 10.4 (H) 11/22/2023    Review of Glycemic Control  Latest Reference Range & Units 11/23/23 10:08 11/23/23 11:09 11/23/23 15:46 11/23/23 21:51 11/24/23 06:23  Glucose-Capillary 70 - 99 mg/dL 161 (H) 096 (H) 045 (H) 267 (H) 228 (H)   Diabetes history: New Diabetes Diagnosis  Current orders for Inpatient glycemic control:  Semglee 10 units Novolog 0-20 units tid + hs  A1c 10.4% this admission  Inpatient Diabetes Program Recommendations:   -   Increase Semglee to 20 units (0.2 units/kg)    Discharge Recommendations: Other recommendations: Freestyle Libre 3 Sensor order # [id number], Reader order # [id number] Long acting recommendations: Insulin Glargine (LANTUS) Solostar Pen 20 units Daily  Supply/Referral recommendations: Glucometer Test strips Lancet device Lancets Pen needles - standard   Use Adult Diabetes Insulin Treatment Post Discharge order set.  Copay for injectables are high will need to discuss with pt and daughters.  -   Ozempic has been APPROVED  Copay is $482.83   Tried looking at coupon cards for Ozempic to help with the cost. Pt does not qualify due to being on governmental insurance (ie. Medicare)  Thanks,  Christena Deem RN, MSN, BC-ADM Inpatient Diabetes Coordinator Team Pager [phone] (8a-5p)

## 2023-11-27 ENCOUNTER — Observation Stay (HOSPITAL_BASED_OUTPATIENT_CLINIC_OR_DEPARTMENT_OTHER)
Admission: EM | Admit: 2023-11-27 | Discharge: 2023-11-30 | Disposition: A | Discharge status: Skilled Nursing Facility | Payer: Medicare HMO | Source: Home / Self Care | Attending: Emergency Medicine | Admitting: Emergency Medicine

## 2023-11-27 DIAGNOSIS — I499 Cardiac arrhythmia, unspecified: Principal | ICD-10-CM | POA: Insufficient documentation

## 2023-11-27 DIAGNOSIS — G629 Polyneuropathy, unspecified: Secondary | ICD-10-CM

## 2023-11-27 DIAGNOSIS — R739 Hyperglycemia, unspecified: Secondary | ICD-10-CM | POA: Diagnosis not present

## 2023-11-27 DIAGNOSIS — E119 Type 2 diabetes mellitus without complications: Secondary | ICD-10-CM | POA: Diagnosis not present

## 2023-11-27 DIAGNOSIS — I129 Hypertensive chronic kidney disease with stage 1 through stage 4 chronic kidney disease, or unspecified chronic kidney disease: Secondary | ICD-10-CM | POA: Insufficient documentation

## 2023-11-27 DIAGNOSIS — I471 Supraventricular tachycardia, unspecified: Principal | ICD-10-CM | POA: Diagnosis present

## 2023-11-27 DIAGNOSIS — N182 Chronic kidney disease, stage 2 (mild): Secondary | ICD-10-CM | POA: Diagnosis not present

## 2023-11-27 DIAGNOSIS — Z6841 Body Mass Index (BMI) 40.0 and over, adult: Secondary | ICD-10-CM

## 2023-11-27 DIAGNOSIS — Z7982 Long term (current) use of aspirin: Secondary | ICD-10-CM | POA: Insufficient documentation

## 2023-11-27 DIAGNOSIS — E1122 Type 2 diabetes mellitus with diabetic chronic kidney disease: Secondary | ICD-10-CM | POA: Insufficient documentation

## 2023-11-27 DIAGNOSIS — Z79899 Other long term (current) drug therapy: Secondary | ICD-10-CM | POA: Insufficient documentation

## 2023-11-27 DIAGNOSIS — R42 Dizziness and giddiness: Secondary | ICD-10-CM | POA: Diagnosis not present

## 2023-11-27 DIAGNOSIS — Z743 Need for continuous supervision: Secondary | ICD-10-CM | POA: Diagnosis not present

## 2023-11-27 DIAGNOSIS — R531 Weakness: Secondary | ICD-10-CM | POA: Diagnosis not present

## 2023-11-27 DIAGNOSIS — R55 Syncope and collapse: Secondary | ICD-10-CM | POA: Insufficient documentation

## 2023-11-27 DIAGNOSIS — Z794 Long term (current) use of insulin: Secondary | ICD-10-CM | POA: Diagnosis not present

## 2023-11-27 DIAGNOSIS — Z7984 Long term (current) use of oral hypoglycemic drugs: Secondary | ICD-10-CM | POA: Insufficient documentation

## 2023-11-27 DIAGNOSIS — R5381 Other malaise: Secondary | ICD-10-CM | POA: Insufficient documentation

## 2023-11-27 DIAGNOSIS — I517 Cardiomegaly: Secondary | ICD-10-CM | POA: Diagnosis not present

## 2023-11-27 DIAGNOSIS — E785 Hyperlipidemia, unspecified: Secondary | ICD-10-CM | POA: Insufficient documentation

## 2023-11-27 DIAGNOSIS — I2510 Atherosclerotic heart disease of native coronary artery without angina pectoris: Secondary | ICD-10-CM | POA: Diagnosis not present

## 2023-11-27 DIAGNOSIS — E1140 Type 2 diabetes mellitus with diabetic neuropathy, unspecified: Secondary | ICD-10-CM | POA: Insufficient documentation

## 2023-11-27 DIAGNOSIS — G609 Hereditary and idiopathic neuropathy, unspecified: Secondary | ICD-10-CM | POA: Diagnosis not present

## 2023-11-27 DIAGNOSIS — R0602 Shortness of breath: Secondary | ICD-10-CM | POA: Diagnosis not present

## 2023-11-27 DIAGNOSIS — I700 Atherosclerosis of aorta: Secondary | ICD-10-CM | POA: Diagnosis not present

## 2023-11-27 DIAGNOSIS — J449 Chronic obstructive pulmonary disease, unspecified: Secondary | ICD-10-CM | POA: Diagnosis present

## 2023-11-27 DIAGNOSIS — R000 Tachycardia, unspecified: Secondary | ICD-10-CM | POA: Diagnosis not present

## 2023-11-27 DIAGNOSIS — E7849 Other hyperlipidemia: Secondary | ICD-10-CM | POA: Diagnosis not present

## 2023-11-27 DIAGNOSIS — I10 Essential (primary) hypertension: Secondary | ICD-10-CM | POA: Diagnosis present

## 2023-11-27 DIAGNOSIS — Z9861 Coronary angioplasty status: Secondary | ICD-10-CM | POA: Diagnosis not present

## 2023-11-27 DIAGNOSIS — J410 Simple chronic bronchitis: Secondary | ICD-10-CM | POA: Diagnosis not present

## 2023-11-27 DIAGNOSIS — Z7401 Bed confinement status: Secondary | ICD-10-CM | POA: Diagnosis not present

## 2023-11-27 DIAGNOSIS — Z87891 Personal history of nicotine dependence: Secondary | ICD-10-CM | POA: Insufficient documentation

## 2023-11-27 DIAGNOSIS — R110 Nausea: Secondary | ICD-10-CM | POA: Insufficient documentation

## 2023-11-27 LAB — GLUCOSE, CAPILLARY: Glucose-Capillary: 220 mg/dL — ABNORMAL HIGH (ref 70–99)

## 2023-11-27 MED ORDER — ONDANSETRON HCL 4 MG/2ML IJ SOLN
4.0000 mg | Freq: Once | INTRAMUSCULAR | Status: AC
  Administered 2023-11-28: 4 mg via INTRAVENOUS
  Filled 2023-11-27: qty 2

## 2023-11-27 MED ORDER — METFORMIN HCL 500 MG PO TABS: 500.0000 mg | ORAL_TABLET | Freq: Two times a day (BID) | ORAL | Status: DC

## 2023-11-27 MED ORDER — MICONAZOLE NITRATE 2 % EX CREA: TOPICAL_CREAM | Freq: Two times a day (BID) | CUTANEOUS | 0 refills | Status: DC

## 2023-11-27 MED ORDER — METOPROLOL SUCCINATE ER 25 MG PO TB24: 25.0000 mg | ORAL_TABLET | Freq: Every day | ORAL | Status: DC

## 2023-11-27 MED ORDER — INSULIN GLARGINE-YFGN 100 UNIT/ML ~~LOC~~ SOPN: 10.0000 [IU] | PEN_INJECTOR | Freq: Every day | SUBCUTANEOUS | Status: DC

## 2023-11-27 MED ORDER — HYDROCERIN EX CREA: 1.0000 | TOPICAL_CREAM | Freq: Two times a day (BID) | CUTANEOUS | Status: DC

## 2023-11-27 MED ORDER — GABAPENTIN 100 MG PO CAPS: 100.0000 mg | ORAL_CAPSULE | Freq: Every day | ORAL | Status: DC

## 2023-11-27 MED ORDER — ATORVASTATIN CALCIUM 20 MG PO TABS: 20.0000 mg | ORAL_TABLET | Freq: Every day | ORAL | Status: DC

## 2023-11-27 NOTE — Discharge Summary (Signed)
Physician Discharge Summary  Monica Thompson:[id number] DOB: 1951-03-25 DOA: 11/22/2023  PCP: Monica Peri, MD  Admit date: 11/22/2023 Discharge date: 11/27/2023  Admitted From: Home  Discharge disposition: SNF   Recommendations for Outpatient Follow-Up:   Follow up with your primary care provider in 3-5 days at the SNF Check CBC, BMP, magnesium in the next visit   Discharge Diagnosis:   Principal Problem:   SVT (supraventricular tachycardia) (HCC) Active Problems:   COPD (chronic obstructive pulmonary disease) (HCC)   Hyperlipidemia   Obesity, Class III, BMI 40-49.9 (morbid obesity) (HCC)   CAD S/P percutaneous coronary angioplasty   Hyperglycemia   Type 2 diabetes mellitus with chronic kidney disease, without long-term current use of insulin (HCC)   Discharge Condition: Improved.  Diet recommendation: Low sodium, heart healthy.  Carbohydrate-modified.   Wound care: None.  Code status: Full.   History of Present Illness:   73 year old female with past medical history of coronary artery disease status post stent, morbid obesity, COPD, hypertension who has not seen a physician in at least 4 to 5 years presented to hospital with shortness of breath.  EMS was called in and was noted to be in supraventricular tachycardia.  She got 3 vagal maneuvers improved sinus tachycardia and was brought into the hospital.  In the ED, patient was tachycardic.  Labs showed the hyperglycemia.  BNP 73.  COVID influenza and RSV was negative.  Troponin is within negative.  Chest x-ray was negative for any infiltrate.     Hospital Course:   Following conditions were addressed during hospitalization as listed below,  SVT (supraventricular tachycardia)  Resolved. Troponin negative.  TSH of 2.8..  Will continue with Toprol XL on discharge.  2D echocardiogram with LV ejection fraction of 55 to 60% with grade 1 diastolic dysfunction.  Continue aspirin.   CAD S/P percutaneous coronary  angioplasty Pt takes no regular medications.  Continue aspirin, metoprolol.  No chest pain during hospitalization   Obesity, Class III, BMI 40-49.9 (morbid obesity)  Would benefit from weight loss as outpatient.  TSH of 2.8.  Counseling done.   Hyperlipidemia We will start a low-dose of Lipitor   COPD (chronic obstructive pulmonary disease) Stable.   Hyperglycemia likely new onset diabetes. Hemoglobin A1c of 10.4.  Continue long-acting insulin.  Will add metformin on discharge.   Bilateral redness of feet with scaling.  Neuropathy. Continue Eucerin fungal cream and low-dose Neurontin at nighttime.  Patient states that it feels better now.   ABI of the lower extremities showed mild PAD.  Will continue to with fungal cream and Eucerin on discharge.  Continue aspirin and statins.  Disposition.  At this time, patient is stable for disposition to skilled nursing facility.  Medical Consultants:   None.  Procedures:   ABI of the lower extremities Subjective:   Today, patient was seen and examined at bedside.  Denies interval complaints.  No chest pain shortness of breath fever chills or rigor.  Discharge Exam:   Vitals:   11/27/23 0447 11/27/23 0728  BP: (!) 139/59 (!) 112/54  Pulse: 64 60  Resp: 20 20  Temp: 97.6 F (36.4 C) 97.7 F (36.5 C)  SpO2: 93% 95%   Vitals:   11/26/23 2010 11/27/23 0115 11/27/23 0447 11/27/23 0728  BP: (!) 158/71 (!) 141/67 (!) 139/59 (!) 112/54  Pulse: (!) 56 67 64 60  Resp: 18 20 20 20   Temp: 97.7 F (36.5 C) 97.8 F (36.6 C) 97.6 F (36.4 C) 97.7 F (  36.5 C)  TempSrc: Oral Oral Oral Oral  SpO2: 96% 94% 93% 95%  Weight:   (!) 144.3 kg   Body mass index is 54.61 kg/m.   General: Alert awake, not in obvious distress, obese built HENT: pupils equally reacting to light,  No scleral pallor or icterus noted. Oral mucosa is moist.  Chest:  Diminished breath sounds bilaterally. No crackles or wheezes.  CVS: S1 &S2 heard. No murmur.  Regular  rate and rhythm. Abdomen: Soft, nontender, nondistended.  Bowel sounds are heard.   Extremities: No cyanosis, clubbing or edema.  Peripheral pulses are palpable. Psych: Alert, awake and oriented, normal mood CNS:  No cranial nerve deficits.  Power equal in all extremities.   Skin: Warm and dry.  R feet with redness and some scaling.  The results of significant diagnostics from this hospitalization (including imaging, microbiology, ancillary and laboratory) are listed below for reference.     Diagnostic Studies:   ECHOCARDIOGRAM COMPLETE Result Date: 11/22/2023    ECHOCARDIOGRAM REPORT   Patient Name:   Monica Thompson Date of Exam: 11/22/2023 Medical Rec #:  [id number]       Height:       64.0 in Accession #:    [id number]      Weight:       290.0 lb Date of Birth:  Oct 17, 1950       BSA:          2.291 m Patient Age:    72 years        BP:           106/93 mmHg Patient Gender: F               HR:           70 bpm. Exam Location:  Inpatient Procedure: 2D Echo, Intracardiac Opacification Agent, Color Doppler and Cardiac            Doppler Indications:    supraventricular tachycardia (svt)  History:        Patient has prior history of Echocardiogram examinations, most                 recent 11/23/2014. CAD, COPD, Arrythmias:NSTEMI; Risk                 Factors:Hypertension, Dyslipidemia and Current Smoker.  Sonographer:    Raeford Razor RDCS Referring Phys: [id number] ERIC CHEN  Sonographer Comments: Technically difficult study due to poor echo windows, no subcostal window and patient is obese. IMPRESSIONS  1. Images are limited.  2. Left ventricular ejection fraction, by estimation, is 55 to 60%. The left ventricle has normal function. The left ventricle has no regional wall motion abnormalities. Left ventricular diastolic parameters are consistent with Grade I diastolic dysfunction (impaired relaxation).  3. Right ventricular systolic function is low normal. The right ventricular size is normal. Tricuspid regurgitation  signal is inadequate for assessing PA pressure.  4. A small pericardial effusion is present. The pericardial effusion is posterior to the left ventricle.  5. The mitral valve is grossly normal. Trivial mitral valve regurgitation.  6. The aortic valve was not well visualized. Aortic valve regurgitation is not visualized. Comparison(s): Prior images unable to be directly viewed. FINDINGS  Left Ventricle: Left ventricular ejection fraction, by estimation, is 55 to 60%. The left ventricle has normal function. The left ventricle has no regional wall motion abnormalities. The left ventricular internal cavity size was normal in size. There is  no left ventricular hypertrophy.  Left ventricular diastolic parameters are consistent with Grade I diastolic dysfunction (impaired relaxation). Right Ventricle: The right ventricular size is normal. Right vetricular wall thickness was not well visualized. Right ventricular systolic function is low normal. Tricuspid regurgitation signal is inadequate for assessing PA pressure. Left Atrium: Left atrial size was normal in size. Right Atrium: Right atrial size was normal in size. Pericardium: A small pericardial effusion is present. The pericardial effusion is posterior to the left ventricle. Presence of epicardial fat layer. Mitral Valve: The mitral valve is grossly normal. Mild mitral annular calcification. Trivial mitral valve regurgitation. Tricuspid Valve: The tricuspid valve is grossly normal. Tricuspid valve regurgitation is trivial. Aortic Valve: The aortic valve was not well visualized. Aortic valve regurgitation is not visualized. Pulmonic Valve: The pulmonic valve was grossly normal. Pulmonic valve regurgitation is trivial. Aorta: The aortic root and ascending aorta are structurally normal, with no evidence of dilitation. IAS/Shunts: The interatrial septum was not well visualized.  LEFT VENTRICLE PLAX 2D LVIDd:         4.90 cm      Diastology LVIDs:         3.10 cm      LV e'  medial:    3.05 cm/s LV PW:         1.00 cm      LV E/e' medial:  20.7 LV IVS:        0.80 cm      LV e' lateral:   8.70 cm/s                             LV E/e' lateral: 7.3  LV Volumes (MOD) LV vol d, MOD A2C: 96.3 ml LV vol d, MOD A4C: 137.0 ml LV vol s, MOD A2C: 35.9 ml LV vol s, MOD A4C: 47.3 ml LV SV MOD A2C:     60.4 ml LV SV MOD A4C:     137.0 ml LV SV MOD BP:      77.2 ml RIGHT VENTRICLE RV Basal diam:  3.20 cm RV S prime:     12.60 cm/s TAPSE (M-mode): 2.0 cm LEFT ATRIUM             Index        RIGHT ATRIUM           Index LA diam:        4.70 cm 2.05 cm/m   RA Area:     17.40 cm LA Vol (A2C):   59.5 ml 25.97 ml/m  RA Volume:   43.90 ml  19.16 ml/m LA Vol (A4C):   34.3 ml 14.97 ml/m LA Biplane Vol: 50.3 ml 21.96 ml/m  AORTIC VALVE LVOT Vmax:   88.30 cm/s  AORTA Ao Root diam: 2.90 cm Ao Asc diam:  3.20 cm MITRAL VALVE MV Area (PHT): 2.66 cm MV Decel Time: 285 msec MV E velocity: 63.20 cm/s MV A velocity: 70.20 cm/s MV E/A ratio:  0.90 Nona Dell MD Electronically signed by Nona Dell MD Signature Date/Time: 11/22/2023/4:21:29 PM    Final    DG Chest Portable 1 View Result Date: 11/22/2023 CLINICAL DATA:  Shortness of breath. EXAM: PORTABLE CHEST 1 VIEW COMPARISON:  Chest 11/30/2014 FINDINGS: There is mild cardiomegaly. Central vessels are normal in caliber. The mediastinum is normally outlined. There is calcification of the transverse aorta. The lungs are clear. Moderate thoracic spondylosis. Compare: Unchanged. IMPRESSION: No evidence of acute chest disease. Mild cardiomegaly. Aortic atherosclerosis.  Electronically Signed   By: Almira Bar M.D.   On: 11/22/2023 04:16     Labs:   Basic Metabolic Panel: Recent Labs  Lab 11/22/23 0212 11/24/23 0229 11/25/23 0255 11/26/23 0303  NA 137 136 135 135  K 4.3 4.8 4.2 4.3  CL 103 101 100 100  CO2 22 28 23 25   GLUCOSE 403* 300* 262* 243*  BUN 23 24* 20 22  CREATININE 1.00 0.96 0.71 1.04*  CALCIUM 9.2 8.7* 8.7* 8.8*  MG  --   2.0  --   --    GFR CrCl cannot be calculated (Unknown ideal weight.). Liver Function Tests: No results for input(s): "AST", "ALT", "ALKPHOS", "BILITOT", "PROT", "ALBUMIN" in the last 168 hours. No results for input(s): "LIPASE", "AMYLASE" in the last 168 hours. No results for input(s): "AMMONIA" in the last 168 hours. Coagulation profile No results for input(s): "INR", "PROTIME" in the last 168 hours.  CBC: Recent Labs  Lab 11/22/23 0212 11/24/23 0229 11/25/23 0255  WBC 8.8 6.8 6.4  NEUTROABS 6.2  --   --   HGB 15.4* 12.7 13.1  HCT 46.8* 39.1 40.5  MCV 89.1 90.1 88.6  PLT 341 283 290   Cardiac Enzymes: No results for input(s): "CKTOTAL", "CKMB", "CKMBINDEX", "TROPONINI" in the last 168 hours. BNP: Invalid input(s): "POCBNP" CBG: Recent Labs  Lab 11/26/23 0620 11/26/23 1120 11/26/23 1533 11/26/23 2144 11/27/23 0623  GLUCAP 242* 259* 282* 162* 220*   D-Dimer No results for input(s): "DDIMER" in the last 72 hours. Hgb A1c No results for input(s): "HGBA1C" in the last 72 hours. Lipid Profile Recent Labs    11/25/23 0255  CHOL 239*  HDL 37*  LDLCALC UNABLE TO CALCULATE IF TRIGLYCERIDE OVER 400 mg/dL  TRIG 161*  CHOLHDL 6.5  LDLDIRECT 162*   Thyroid function studies No results for input(s): "TSH", "T4TOTAL", "T3FREE", "THYROIDAB" in the last 72 hours.  Invalid input(s): "FREET3" Anemia work up No results for input(s): "VITAMINB12", "FOLATE", "FERRITIN", "TIBC", "IRON", "RETICCTPCT" in the last 72 hours. Microbiology Recent Results (from the past 240 hours)  Resp panel by RT-PCR (RSV, Flu A&B, Covid) Anterior Nasal Swab     Status: None   Collection Time: 11/22/23  2:15 AM   Specimen: Anterior Nasal Swab  Result Value Ref Range Status   SARS Coronavirus 2 by RT PCR NEGATIVE NEGATIVE Final   Influenza A by PCR NEGATIVE NEGATIVE Final   Influenza B by PCR NEGATIVE NEGATIVE Final    Comment: (NOTE) The Xpert Xpress SARS-CoV-2/FLU/RSV plus assay is intended  as an aid in the diagnosis of influenza from Nasopharyngeal swab specimens and should not be used as a sole basis for treatment. Nasal washings and aspirates are unacceptable for Xpert Xpress SARS-CoV-2/FLU/RSV testing.  Fact Sheet for Patients: BloggerCourse.com  Fact Sheet for Healthcare Providers: SeriousBroker.it  This test is not yet approved or cleared by the [place] FDA and has been authorized for detection and/or diagnosis of SARS-CoV-2 by FDA under an Emergency Use Authorization (EUA). This EUA will remain in effect (meaning this test can be used) for the duration of the COVID-19 declaration under Section 564(b)(1) of the Act, 21 U.S.C. section 360bbb-3(b)(1), unless the authorization is terminated or revoked.     Resp Syncytial Virus by PCR NEGATIVE NEGATIVE Final    Comment: (NOTE) Fact Sheet for Patients: BloggerCourse.com  Fact Sheet for Healthcare Providers: SeriousBroker.it  This test is not yet approved or cleared by the [place] FDA and has been authorized for detection  and/or diagnosis of SARS-CoV-2 by FDA under an Emergency Use Authorization (EUA). This EUA will remain in effect (meaning this test can be used) for the duration of the COVID-19 declaration under Section 564(b)(1) of the Act, 21 U.S.C. section 360bbb-3(b)(1), unless the authorization is terminated or revoked.  Performed at [facility], [address], [place], [place] [address]      Discharge Instructions:   Discharge Instructions     Call MD for:  temperature >100.4   Complete by: As directed    Diet Carb Modified   Complete by: As directed    Discharge instructions   Complete by: As directed    Follow up with the primary care provider in 1 to 2 weeks after discharge.  Take medications as prescribed.  Discuss about diabetes management.  Check your blood sugars at  home.  Have been started on metformin while in the hospital.  Stay on diabetic diet.  Seek medical attention for worsening symptoms.   Discharge instructions   Complete by: As directed    Follow-up with primary care provider at the skilled nursing facility in 3 to 5 days.  Check blood work at that time.  Seek medical attention for worsening symptoms.   Increase activity slowly   Complete by: As directed    Increase activity slowly   Complete by: As directed       Allergies as of 11/27/2023   No Known Allergies      Medication List     TAKE these medications    aspirin EC 81 MG tablet Take 81 mg by mouth daily. Swallow whole.   atorvastatin 20 MG tablet Commonly known as: LIPITOR Take 1 tablet (20 mg total) by mouth daily. Start taking on: November 28, 2023   gabapentin 100 MG capsule Commonly known as: NEURONTIN Take 1 capsule (100 mg total) by mouth at bedtime.   hydrocerin Crea Apply 1 Application topically 2 (two) times daily.   insulin glargine-yfgn 100 UNIT/ML Pen Commonly known as: SEMGLEE Inject 10 Units into the skin at bedtime.   metFORMIN 500 MG tablet Commonly known as: GLUCOPHAGE Take 1 tablet (500 mg total) by mouth 2 (two) times daily with a meal.   metoprolol succinate 25 MG 24 hr tablet Commonly known as: Toprol XL Take 1 tablet (25 mg total) by mouth daily.   miconazole 2 % cream Commonly known as: MICOTIN Apply topically 2 (two) times daily.        Contact information for after-discharge care     Destination     [facility] Preferred SNF .   Service: Skilled Nursing Contact information: [address] Test Update Address [place] [address] [phone]                      Time coordinating discharge: 39 minutes  Signed:  Tyne Banta  [facility] 11/27/2023, 9:52 AM

## 2023-11-27 NOTE — Progress Notes (Signed)
Report called to [facility] LPN at [facility]

## 2023-11-27 NOTE — TOC Transition Note (Signed)
Transition of Care [facility]) - Discharge Note   Patient Details  Name: Monica Thompson MRN: [id number] Date of Birth: Dec 20, 1950  [facility] CM/SW Contact:  Michaela Corner, LCSWA Phone Number: 11/27/2023, 10:26 AM   Clinical Narrative:   Patient will DC to: [facility] Anticipated DC date: 11/27/23 Family notified: [name] (dtr) Transport by: Sharin Mons   Per MD patient ready for DC to [facility]. RN to call report prior to discharge [phone]; room 402). RN, patient, patient's family, and facility notified of DC. Discharge Summary and FL2 sent to facility. DC packet on chart. Ambulance transport requested for patient.   CSW will sign off for now as social work intervention is no longer needed. Please consult [place] again if new needs arise.      Final next level of care: Skilled Nursing Facility Barriers to Discharge: Barriers Resolved   Patient Goals and CMS Choice Patient states their goals for this hospitalization and ongoing recovery are:: return home   Choice offered to / list presented to : NA      Discharge Placement              Patient chooses bed at: [facility] Patient to be transferred to facility by: [facility] Name of family member notified: [name] (dtr) Patient and family notified of of transfer: 11/27/23  Discharge Plan and Services Additional resources added to the After Visit Summary for   In-house Referral: NA Discharge Planning Services: CM Consult Post Acute Care Choice: NA          DME Arranged: N/A DME Agency: NA                  Social Drivers of Health (SDOH) Interventions SDOH Screenings   Food Insecurity: No Food Insecurity (11/23/2023)  Housing: Low Risk  (11/23/2023)  Transportation Needs: No Transportation Needs (11/23/2023)  Utilities: Not At Risk (11/23/2023)  Social Connections: Socially Isolated (11/23/2023)  Tobacco Use: Medium Risk (11/22/2023)     Readmission Risk Interventions     No data to display

## 2023-11-27 NOTE — Plan of Care (Signed)

## 2023-11-27 NOTE — ED Triage Notes (Signed)
Patient BIB EMS from [facility] SNF - patient was assisted to a bedside commode when she had a syncopal episode. Patient was noted to be in Afib & SVT on the way with EMS, vagal maneuvers were successful.

## 2023-11-28 ENCOUNTER — Emergency Department (HOSPITAL_COMMUNITY): Payer: Medicare HMO

## 2023-11-28 ENCOUNTER — Encounter (HOSPITAL_COMMUNITY): Payer: Self-pay | Admitting: Emergency Medicine

## 2023-11-28 DIAGNOSIS — E119 Type 2 diabetes mellitus without complications: Secondary | ICD-10-CM

## 2023-11-28 DIAGNOSIS — I10 Essential (primary) hypertension: Secondary | ICD-10-CM

## 2023-11-28 DIAGNOSIS — Z9861 Coronary angioplasty status: Secondary | ICD-10-CM | POA: Diagnosis not present

## 2023-11-28 DIAGNOSIS — I2510 Atherosclerotic heart disease of native coronary artery without angina pectoris: Secondary | ICD-10-CM

## 2023-11-28 DIAGNOSIS — E7849 Other hyperlipidemia: Secondary | ICD-10-CM | POA: Diagnosis not present

## 2023-11-28 DIAGNOSIS — G609 Hereditary and idiopathic neuropathy, unspecified: Secondary | ICD-10-CM | POA: Diagnosis not present

## 2023-11-28 DIAGNOSIS — N182 Chronic kidney disease, stage 2 (mild): Secondary | ICD-10-CM | POA: Insufficient documentation

## 2023-11-28 DIAGNOSIS — R0602 Shortness of breath: Secondary | ICD-10-CM | POA: Diagnosis not present

## 2023-11-28 DIAGNOSIS — R000 Tachycardia, unspecified: Secondary | ICD-10-CM | POA: Diagnosis not present

## 2023-11-28 DIAGNOSIS — Z794 Long term (current) use of insulin: Secondary | ICD-10-CM | POA: Diagnosis not present

## 2023-11-28 DIAGNOSIS — J410 Simple chronic bronchitis: Secondary | ICD-10-CM | POA: Diagnosis not present

## 2023-11-28 DIAGNOSIS — R55 Syncope and collapse: Secondary | ICD-10-CM | POA: Diagnosis present

## 2023-11-28 DIAGNOSIS — I700 Atherosclerosis of aorta: Secondary | ICD-10-CM | POA: Diagnosis not present

## 2023-11-28 DIAGNOSIS — I471 Supraventricular tachycardia, unspecified: Secondary | ICD-10-CM

## 2023-11-28 DIAGNOSIS — G629 Polyneuropathy, unspecified: Secondary | ICD-10-CM

## 2023-11-28 DIAGNOSIS — I517 Cardiomegaly: Secondary | ICD-10-CM | POA: Diagnosis not present

## 2023-11-28 LAB — CBC
HCT: 43.1 % (ref 36.0–46.0)
HCT: 46.8 % — ABNORMAL HIGH (ref 36.0–46.0)
Hemoglobin: 14.1 g/dL (ref 12.0–15.0)
Hemoglobin: 14.3 g/dL (ref 12.0–15.0)
MCH: 29.2 pg (ref 26.0–34.0)
MCH: 29.3 pg (ref 26.0–34.0)
MCHC: 30.6 g/dL (ref 30.0–36.0)
MCHC: 32.7 g/dL (ref 30.0–36.0)
MCV: 89.4 fL (ref 80.0–100.0)
MCV: 95.5 fL (ref 80.0–100.0)
Platelets: 246 10*3/uL (ref 150–400)
Platelets: 314 10*3/uL (ref 150–400)
RBC: 4.82 MIL/uL (ref 3.87–5.11)
RBC: 4.9 MIL/uL (ref 3.87–5.11)
RDW: 13.4 % (ref 11.5–15.5)
RDW: 13.5 % (ref 11.5–15.5)
WBC: 7.3 10*3/uL (ref 4.0–10.5)
WBC: 8.8 10*3/uL (ref 4.0–10.5)
nRBC: 0 % (ref 0.0–0.2)
nRBC: 0 % (ref 0.0–0.2)

## 2023-11-28 LAB — TROPONIN I (HIGH SENSITIVITY): Troponin I (High Sensitivity): 8 ng/L (ref <18)

## 2023-11-28 LAB — COMPREHENSIVE METABOLIC PANEL
ALT: 26 U/L (ref 0–44)
AST: 17 U/L (ref 15–41)
Albumin: 2.6 g/dL — ABNORMAL LOW (ref 3.5–5.0)
Alkaline Phosphatase: 71 U/L (ref 38–126)
Anion gap: 14 (ref 5–15)
BUN: 17 mg/dL (ref 8–23)
CO2: 18 mmol/L — ABNORMAL LOW (ref 22–32)
Calcium: 8.4 mg/dL — ABNORMAL LOW (ref 8.9–10.3)
Chloride: 105 mmol/L (ref 98–111)
Creatinine, Ser: 0.84 mg/dL (ref 0.44–1.00)
GFR, Estimated: >60 mL/min (ref >60)
Glucose, Bld: 234 mg/dL — ABNORMAL HIGH (ref 70–99)
Potassium: 4.5 mmol/L (ref 3.5–5.1)
Sodium: 137 mmol/L (ref 135–145)
Total Bilirubin: 0.7 mg/dL (ref 0.0–1.2)
Total Protein: 5.6 g/dL — ABNORMAL LOW (ref 6.5–8.1)

## 2023-11-28 LAB — BASIC METABOLIC PANEL
Anion gap: 14 (ref 5–15)
BUN: 18 mg/dL (ref 8–23)
CO2: 23 mmol/L (ref 22–32)
Calcium: 9 mg/dL (ref 8.9–10.3)
Chloride: 102 mmol/L (ref 98–111)
Creatinine, Ser: 0.85 mg/dL (ref 0.44–1.00)
GFR, Estimated: >60 mL/min (ref >60)
Glucose, Bld: 238 mg/dL — ABNORMAL HIGH (ref 70–99)
Potassium: 4.4 mmol/L (ref 3.5–5.1)
Sodium: 139 mmol/L (ref 135–145)

## 2023-11-28 LAB — CBG MONITORING, ED
Glucose-Capillary: 195 mg/dL — ABNORMAL HIGH (ref 70–99)
Glucose-Capillary: 192 mg/dL — ABNORMAL HIGH (ref 70–99)

## 2023-11-28 LAB — BRAIN NATRIURETIC PEPTIDE: B Natriuretic Peptide: 73.8 pg/mL (ref 0.0–100.0)

## 2023-11-28 LAB — GLUCOSE, CAPILLARY
Glucose-Capillary: 162 mg/dL — ABNORMAL HIGH (ref 70–99)
Glucose-Capillary: 184 mg/dL — ABNORMAL HIGH (ref 70–99)

## 2023-11-28 LAB — MAGNESIUM: Magnesium: 1.9 mg/dL (ref 1.7–2.4)

## 2023-11-28 MED ORDER — LEVALBUTEROL HCL 0.63 MG/3ML IN NEBU: 0.6300 mg | INHALATION_SOLUTION | Freq: Four times a day (QID) | RESPIRATORY_TRACT | Status: DC | PRN

## 2023-11-28 MED ORDER — INSULIN GLARGINE-YFGN 100 UNIT/ML ~~LOC~~ SOLN: 10.0000 [IU] | Freq: Every day | SUBCUTANEOUS | Status: DC

## 2023-11-28 MED ORDER — ONDANSETRON HCL 4 MG PO TABS
4.0000 mg | ORAL_TABLET | Freq: Four times a day (QID) | ORAL | Status: DC | PRN
  Administered 2023-11-29: 4 mg via ORAL
  Filled 2023-11-28: qty 1

## 2023-11-28 MED ORDER — METOPROLOL TARTRATE 50 MG PO TABS
50.0000 mg | ORAL_TABLET | Freq: Two times a day (BID) | ORAL | Status: DC
  Administered 2023-11-29 – 2023-11-30 (×2): 50 mg via ORAL
  Filled 2023-11-28: qty 1
  Filled 2023-11-28: qty 2
  Filled 2023-11-28 (×3): qty 1

## 2023-11-28 MED ORDER — GABAPENTIN 100 MG PO CAPS
100.0000 mg | ORAL_CAPSULE | Freq: Every day | ORAL | Status: DC
  Administered 2023-11-28 – 2023-11-29 (×2): 100 mg via ORAL
  Filled 2023-11-28 (×2): qty 1

## 2023-11-28 MED ORDER — ASPIRIN 81 MG PO TBEC
81.0000 mg | DELAYED_RELEASE_TABLET | Freq: Every day | ORAL | Status: DC
  Administered 2023-11-28 – 2023-11-30 (×3): 81 mg via ORAL
  Filled 2023-11-28 (×3): qty 1

## 2023-11-28 MED ORDER — ENOXAPARIN SODIUM 40 MG/0.4ML IJ SOSY
40.0000 mg | PREFILLED_SYRINGE | INTRAMUSCULAR | Status: DC
Start: 2023-11-28 — End: 2023-11-30
  Administered 2023-11-28 – 2023-11-30 (×3): 40 mg via SUBCUTANEOUS
  Filled 2023-11-28 (×3): qty 0.4

## 2023-11-28 MED ORDER — METOPROLOL SUCCINATE ER 25 MG PO TB24
25.0000 mg | ORAL_TABLET | Freq: Every day | ORAL | Status: DC
  Administered 2023-11-28: 25 mg via ORAL
  Filled 2023-11-28: qty 1

## 2023-11-28 MED ORDER — SODIUM CHLORIDE 0.9 % IV BOLUS
1000.0000 mL | INTRAVENOUS | Status: AC
  Administered 2023-11-28: 1000 mL via INTRAVENOUS

## 2023-11-28 MED ORDER — INSULIN GLARGINE-YFGN 100 UNIT/ML ~~LOC~~ SOLN
10.0000 [IU] | Freq: Every day | SUBCUTANEOUS | Status: DC
  Administered 2023-11-28 – 2023-11-30 (×3): 10 [IU] via SUBCUTANEOUS
  Filled 2023-11-28 (×3): qty 0.1

## 2023-11-28 MED ORDER — AMIODARONE HCL IN DEXTROSE 360-4.14 MG/200ML-% IV SOLN: 30.0000 mg/h | INTRAVENOUS | Status: DC

## 2023-11-28 MED ORDER — SODIUM CHLORIDE 0.9% FLUSH
3.0000 mL | Freq: Two times a day (BID) | INTRAVENOUS | Status: DC
  Administered 2023-11-28 – 2023-11-29 (×4): 3 mL via INTRAVENOUS

## 2023-11-28 MED ORDER — INSULIN ASPART 100 UNIT/ML IJ SOLN: 0.0000 [IU] | Freq: Every day | INTRAMUSCULAR | Status: DC

## 2023-11-28 MED ORDER — PANTOPRAZOLE SODIUM 40 MG PO TBEC
40.0000 mg | DELAYED_RELEASE_TABLET | Freq: Every day | ORAL | Status: DC
  Administered 2023-11-28 – 2023-11-30 (×3): 40 mg via ORAL
  Filled 2023-11-28 (×3): qty 1

## 2023-11-28 MED ORDER — ONDANSETRON HCL 4 MG/2ML IJ SOLN
4.0000 mg | Freq: Once | INTRAMUSCULAR | Status: DC
  Filled 2023-11-28: qty 2

## 2023-11-28 MED ORDER — ATORVASTATIN CALCIUM 10 MG PO TABS
20.0000 mg | ORAL_TABLET | Freq: Every day | ORAL | Status: DC
  Administered 2023-11-28 – 2023-11-30 (×3): 20 mg via ORAL
  Filled 2023-11-28 (×3): qty 2

## 2023-11-28 MED ORDER — SODIUM CHLORIDE 0.9% FLUSH: 3.0000 mL | INTRAVENOUS | Status: DC | PRN

## 2023-11-28 MED ORDER — ACETAMINOPHEN 650 MG RE SUPP: 650.0000 mg | Freq: Four times a day (QID) | RECTAL | Status: DC | PRN

## 2023-11-28 MED ORDER — AMIODARONE HCL IN DEXTROSE 360-4.14 MG/200ML-% IV SOLN
60.0000 mg/h | INTRAVENOUS | Status: AC
  Administered 2023-11-28 (×2): 60 mg/h via INTRAVENOUS
  Filled 2023-11-28 (×2): qty 200

## 2023-11-28 MED ORDER — SODIUM CHLORIDE 0.9% FLUSH
3.0000 mL | Freq: Two times a day (BID) | INTRAVENOUS | Status: DC
  Administered 2023-11-28 – 2023-11-30 (×5): 3 mL via INTRAVENOUS

## 2023-11-28 MED ORDER — ACETAMINOPHEN 325 MG PO TABS
650.0000 mg | ORAL_TABLET | Freq: Four times a day (QID) | ORAL | Status: DC | PRN
  Administered 2023-11-28 – 2023-11-29 (×2): 650 mg via ORAL
  Filled 2023-11-28 (×3): qty 2

## 2023-11-28 MED ORDER — METOPROLOL TARTRATE 25 MG PO TABS
25.0000 mg | ORAL_TABLET | Freq: Once | ORAL | Status: AC
  Administered 2023-11-28: 25 mg via ORAL
  Filled 2023-11-28: qty 1

## 2023-11-28 MED ORDER — AMIODARONE LOAD VIA INFUSION
150.0000 mg | Freq: Once | INTRAVENOUS | Status: AC
  Administered 2023-11-28: 150 mg via INTRAVENOUS
  Filled 2023-11-28: qty 83.34

## 2023-11-28 MED ORDER — ONDANSETRON HCL 4 MG/2ML IJ SOLN
4.0000 mg | Freq: Four times a day (QID) | INTRAMUSCULAR | Status: DC | PRN
  Administered 2023-11-28 – 2023-11-29 (×3): 4 mg via INTRAVENOUS
  Filled 2023-11-28 (×5): qty 2

## 2023-11-28 MED ORDER — METOPROLOL SUCCINATE ER 25 MG PO TB24: 25.0000 mg | ORAL_TABLET | Freq: Every day | ORAL | Status: DC

## 2023-11-28 MED ORDER — SODIUM CHLORIDE 0.9 % IV SOLN: 250.0000 mL | INTRAVENOUS | Status: AC | PRN

## 2023-11-28 MED ORDER — SODIUM CHLORIDE 0.9 % IV BOLUS
500.0000 mL | Freq: Once | INTRAVENOUS | Status: AC
  Administered 2023-11-28: 500 mL via INTRAVENOUS

## 2023-11-28 MED ORDER — INSULIN GLARGINE-YFGN 100 UNIT/ML ~~LOC~~ SOPN
10.0000 [IU] | PEN_INJECTOR | Freq: Every day | SUBCUTANEOUS | Status: DC
Start: 2023-11-28 — End: 2023-11-28

## 2023-11-28 MED ORDER — INSULIN ASPART 100 UNIT/ML IJ SOLN
0.0000 [IU] | Freq: Three times a day (TID) | INTRAMUSCULAR | Status: DC
  Administered 2023-11-28 (×2): 1 [IU] via SUBCUTANEOUS

## 2023-11-28 MED ORDER — INSULIN ASPART 100 UNIT/ML IJ SOLN
0.0000 [IU] | Freq: Three times a day (TID) | INTRAMUSCULAR | Status: DC
Start: 2023-11-28 — End: 2023-11-30
  Administered 2023-11-28 – 2023-11-30 (×5): 4 [IU] via SUBCUTANEOUS
  Administered 2023-11-30: 7 [IU] via SUBCUTANEOUS

## 2023-11-28 NOTE — Progress Notes (Signed)
 Progress Note   Patient: Monica Thompson EPP:[id number] DOB: Mar 06, 1951 DOA: 11/27/2023     1 DOS: the patient was seen and examined on 11/28/2023   Brief hospital course: 73yo with h/o CAD s/p PCI, HTN, COPD, HTN, DM, and morbid obesity who presented on 2/14 with presyncope.  She was noted to be in SVT and this improved with vagal maneuvers.  She had a recent similar admission, Echo with preserved EF and grade 1 diastolic dysfunction.  Cardiology consulted overnight and recommend Amiodarone infusion + continued Toprol XL 25 mg daily.  EP consulted.  HR was controlled overnight and she developed mild hypotension and so amiodarone was held and she was given a bolus.  Cardiology has consulted and recommends increasing dose of metoprolol and outpatient f/u.  Assessment and Plan:  Supraventricular tachycardia Patient previously hospitalized from 2/9-14 with syncope associated with SVT 73yo with h/o CAD and discharged to SNF rehab She returned the same of discharge with recurrent symptoms EKG showed supraventricular tachycardia with heart rate 171 Cardiology evaluated overnight, recommended IV amiodarone with continued Toprol-XL. BP decreased, HR converted, and amiodarone was stopped Echocardiogram from 11/22/2023 showed EF 55 to 60% and grade 1 diastolic heart failure TSH, troponin, BNP, Mag++ all WNL EP consulted and recommended increasing metoprolol to 50 mg BID Will monitor on telemetry overnight and likely dc back to SNF tomorrow (2/16) if she remains stable Planned for outpatient cardiology/EP follow-up   Presyncopal event secondary to SVT Dizziness/presyncope secondary to in the setting of SVT Patient denies any loss of consciousness Continue fall precautions    History of CAD  S/p PCI 2016  Continue aspirin 81 mg daily Started on low-dose Lipitor during last hospitalization   Essential hypertension Metoprolol increased to 50 mg BID   Insulin-dependent DM type II A1c  10.4, very  poor control Continue Semglee 10 units Hold metformin Will cover with resistant-scale SSI Continue gabapentin   Peripheral neuropathy Continue gabapentin    History of COPD Stable, appears compensated  Morbid obesity BMI 54.5 Weight loss should be encouraged Outpatient PCP/bariatric medicine f/u encouraged      Consultants: Cardiology/EP  Procedures: None  Antibiotics: None     Subjective: SVT resolved, feels ok but very worried about recurrence given that symptoms recurred the same day of discharge.  Uncomfortable on the bed.  Physical Exam: Vitals:   11/28/23 1210 11/28/23 1215 11/28/23 1245 11/28/23 1403  BP:  (!) 145/62 131/65 (!) 127/52  Pulse:    92  Resp:  16 12 18   Temp: (!) 97.5 F (36.4 C)   97.7 F (36.5 C)  TempSrc: Oral   Oral  SpO2:  100% 96% 100%     Intake/Output Summary (Last 24 hours) at 11/28/2023 1448 Last data filed at 11/28/2023 6063 Gross per 24 hour  Intake 1500 ml  Output 200 ml  Net 1300 ml   There were no vitals filed for this visit.  Exam:  General:  Appears calm and comfortable and is in NAD Eyes:   EOMI, normal lids, iris ENT:  grossly normal hearing, lips & tongue, mmm Neck:  no LAD, masses or thyromegaly Cardiovascular:  RRR, no m/r/g. No LE edema.  Respiratory:   CTA bilaterally with no wheezes/rales/rhonchi.  Normal respiratory effort. Abdomen:  soft, NT, ND Skin:  no rash or induration seen on limited exam Musculoskeletal:  grossly normal tone BUE/BLE, good ROM, no bony abnormality Psychiatric:  grossly normal mood and affect, speech fluent and appropriate, AOx3 Neurologic:  CN 2-12 grossly intact, moves all extremities in coordinated fashion  Data Reviewed: I have reviewed the patient's lab results since admission.  Pertinent labs for today include:  CO2 18 Glucose 234 Albumin 2.6 Unremarkable CBC Normal BMP, troponin     Family Communication: None present; I called and spoke with her  daughter by telephone  Disposition: Status is: Inpatient Remains inpatient appropriate because: ongoing management  Planned Discharge Destination: Skilled nursing facility    Time spent: 50 minutes  Author: Jonah Blue, MD 11/28/2023 2:48 PM  For on call review [email or web address].

## 2023-11-28 NOTE — ED Notes (Signed)
 [name], PA updated on patient HR in the 50's - no new orders at this time; BP stable.

## 2023-11-28 NOTE — Plan of Care (Signed)
  Problem: Fluid Volume: Goal: Ability to maintain a balanced intake and output will improve Outcome: Progressing   Problem: Metabolic: Goal: Ability to maintain appropriate glucose levels will improve Outcome: Progressing   Problem: Tissue Perfusion: Goal: Adequacy of tissue perfusion will improve Outcome: Progressing   Problem: Health Behavior/Discharge Planning: Goal: Ability to manage health-related needs will improve Outcome: Progressing   Problem: Activity: Goal: Risk for activity intolerance will decrease Outcome: Progressing

## 2023-11-28 NOTE — ED Provider Notes (Signed)
 [place] EMERGENCY DEPARTMENT AT [facility] Provider Note   CSN: [id number] Arrival date & time: 11/27/23  2337     History  Chief Complaint  Patient presents with   Tachycardia    Monica Thompson is a 73 y.o. female.  73 y/o female with hx of COPD, CAD (s/p PCI in 2016), HLD, DM presents to the ED from [facility]. Facility reports that patient was being assisted to the bedside commode when she experienced a syncopal event. Patient unable to recall apparent syncope, but has been experiencing waves of SOB and nausea similar to when she was admitted for SVT on 11/22/23. Was discharged from the hospital today after 5 day inpatient course. Started on Toprol XL during admission; last dose given this AM prior to discharge. Presently denies chest pain. Has not had any associated fevers.   The history is provided by the patient and the EMS personnel. No language interpreter was used.       Home Medications Prior to Admission medications   Medication Sig Start Date End Date Taking? Authorizing Provider  aspirin EC 81 MG tablet Take 81 mg by mouth daily. Swallow whole.   Yes [provider]  atorvastatin (LIPITOR) 20 MG tablet Take 1 tablet (20 mg total) by mouth daily. 11/28/23  Yes Pokhrel, Laxman, MD  gabapentin (NEURONTIN) 100 MG capsule Take 1 capsule (100 mg total) by mouth at bedtime. 11/27/23  Yes Pokhrel, Laxman, MD  hydrocerin (EUCERIN) CREA Apply 1 Application topically 2 (two) times daily. 11/27/23  Yes Pokhrel, Laxman, MD  insulin glargine-yfgn (SEMGLEE) 100 UNIT/ML Pen Inject 10 Units into the skin at bedtime. 11/27/23  Yes Pokhrel, Rebekah Chesterfield, MD  metFORMIN (GLUCOPHAGE) 500 MG tablet Take 1 tablet (500 mg total) by mouth 2 (two) times daily with a meal. 11/27/23 01/26/24 Yes Pokhrel, Laxman, MD  metoprolol succinate (TOPROL XL) 25 MG 24 hr tablet Take 1 tablet (25 mg total) by mouth daily. 11/27/23 02/25/24 Yes Pokhrel, Laxman, MD  pantoprazole (PROTONIX) 40 MG  tablet Take 40 mg by mouth daily.   Yes [provider]  miconazole (MICOTIN) 2 % cream Apply topically 2 (two) times daily. Patient not taking: Reported on 11/28/2023 11/27/23   Joycelyn Das, MD      Allergies    Patient has no known allergies.    Review of Systems   Review of Systems Ten systems reviewed and are negative for acute change, except as noted in the HPI.    Physical Exam Updated Vital Signs BP (!) 101/58   Pulse (!) 57   Temp 98 F (36.7 C) (Oral)   Resp 16   SpO2 96%   Physical Exam Vitals and nursing note reviewed.  Constitutional:      General: She is not in acute distress.    Appearance: She is well-developed. She is not diaphoretic.     Comments: Obese female, nontoxic.  HENT:     Head: Normocephalic and atraumatic.  Eyes:     General: No scleral icterus.    Conjunctiva/sclera: Conjunctivae normal.  Cardiovascular:     Rate and Rhythm: Tachycardia present. Rhythm irregular.     Pulses: Normal pulses.     Comments: Distal radial pulse 1+ RUE Pulmonary:     Effort: Pulmonary effort is normal. No respiratory distress.     Breath sounds: No stridor. No wheezing.     Comments: Lungs grossly CTAB. Intermittent dyspnea w/o tachypnea. No respiratory distress. Musculoskeletal:  General: Normal range of motion.     Cervical back: Normal range of motion.  Skin:    General: Skin is warm and dry.     Coloration: Skin is not pale.     Findings: No erythema or rash.  Neurological:     Mental Status: She is alert and oriented to person, place, and time.     Coordination: Coordination normal.  Psychiatric:        Behavior: Behavior normal.     ED Results / Procedures / Treatments   Labs (all labs ordered are listed, but only abnormal results are displayed) Labs Reviewed  BASIC METABOLIC PANEL - Abnormal; Notable for the following components:      Result Value   Glucose, Bld 238 (*)    All other components within normal limits  CBC   MAGNESIUM  BRAIN NATRIURETIC PEPTIDE  CBC  COMPREHENSIVE METABOLIC PANEL  TROPONIN I (HIGH SENSITIVITY)    EKG EKG Interpretation Date/Time:  Friday November 27 2023 23:48:32 EST Ventricular Rate:  171 PR Interval:  130 QRS Duration:  96 QT Interval:  319 QTC Calculation: 539 R Axis:   9  Text Interpretation: Supraventricular tachycardia Sinus pause Anteroseptal infarct, age indeterminate Confirmed by Drema Pry [phone]) on 11/28/2023 12:24:27 AM  Radiology DG Chest Port 1 View Result Date: 11/28/2023 CLINICAL DATA:  Tachycardia and shortness of breath EXAM: PORTABLE CHEST 1 VIEW COMPARISON:  11/22/2023 FINDINGS: Cardiac shadow remains enlarged. Aortic calcifications are seen. Skin folds are noted over the right chest. Lungs are well aerated bilaterally. No focal infiltrate or effusion is noted. No bony abnormality is seen. IMPRESSION: No active disease. Electronically Signed   By: Alcide Clever M.D.   On: 11/28/2023 00:14   VAS [place] ABI WITH/WO TBI Result Date: 11/26/2023  LOWER EXTREMITY DOPPLER STUDY Patient Name:  Monica Thompson  Date of Exam:   11/26/2023 Medical Rec #: [id number]        Accession #:    [id number] Date of Birth: 04-06-1951        Patient Gender: F Patient Age:   53 years Exam Location:  [facility] Procedure:      VAS [place] ABI WITH/WO TBI Referring Phys: [facility] POKHREL --------------------------------------------------------------------------------  Indications: Claudication. High Risk         Hypertension, hyperlipidemia, Diabetes, coronary artery Factors:          disease.  Limitations: Today's exam was limited due to patient unable to lie flat, patient              positioning and patient intolerant to cuff pressure. Comparison Study: No prior studies. Performing Technologist: Olen Cordial RVT  Examination Guidelines: A complete evaluation includes at minimum, Doppler waveform signals and systolic blood pressure reading at the level of bilateral brachial,  anterior tibial, and posterior tibial arteries, when vessel segments are accessible. Bilateral testing is considered an integral part of a complete examination. Photoelectric Plethysmograph (PPG) waveforms and toe systolic pressure readings are included as required and additional duplex testing as needed. Limited examinations for reoccurring indications may be performed as noted.  ABI Findings: +---------+------------------+-----+-----------+--------+ Right    Rt Pressure (mmHg)IndexWaveform   Comment  +---------+------------------+-----+-----------+--------+ Brachial 190                    triphasic           +---------+------------------+-----+-----------+--------+ PTA      178  0.94 multiphasic         +---------+------------------+-----+-----------+--------+ DP       171               0.90 multiphasic         +---------+------------------+-----+-----------+--------+ Great Toe109               0.57                     +---------+------------------+-----+-----------+--------+ +---------+------------------+-----+-----------+-------+ Left     Lt Pressure (mmHg)IndexWaveform   Comment +---------+------------------+-----+-----------+-------+ Brachial                        triphasic          +---------+------------------+-----+-----------+-------+ PTA      161               0.85 multiphasic        +---------+------------------+-----+-----------+-------+ DP       155               0.82 multiphasic        +---------+------------------+-----+-----------+-------+ Great Toe94                0.49                    +---------+------------------+-----+-----------+-------+ +-------+-----------+-----------+------------+------------+ ABI/TBIToday's ABIToday's TBIPrevious ABIPrevious TBI +-------+-----------+-----------+------------+------------+ Right  0.94       0.57                                 +-------+-----------+-----------+------------+------------+ Left   0.85       0.49                                +-------+-----------+-----------+------------+------------+   Summary: Right: Resting right ankle-brachial index indicates mild right lower extremity arterial disease. The right toe-brachial index is abnormal. Left: Resting left ankle-brachial index indicates mild left lower extremity arterial disease. The left toe-brachial index is abnormal. *See table(s) above for measurements and observations.  Electronically signed by Heath Lark on 11/26/2023 at 4:04:30 PM.    Final     Procedures .Critical Care  Performed by: Antony Madura, PA-C Authorized by: Antony Madura, PA-C   Critical care provider statement:    Critical care time (minutes):  30   Critical care time was exclusive of:  Separately billable procedures and treating other patients   Critical care was necessary to treat or prevent imminent or life-threatening deterioration of the following conditions:  Cardiac failure (SVT)   Critical care was time spent personally by me on the following activities:  Development of treatment plan with patient or surrogate, discussions with consultants, evaluation of patient's response to treatment, examination of patient, ordering and review of laboratory studies, ordering and review of radiographic studies, ordering and performing treatments and interventions, pulse oximetry, re-evaluation of patient's condition, review of old charts and obtaining history from patient or surrogate   I assumed direction of critical care for this patient from another provider in my specialty: no     Care discussed with: admitting provider       Medications Ordered in ED Medications  amiodarone (NEXTERONE) 1.8 mg/mL load via infusion 150 mg (150 mg Intravenous Bolus from Bag 11/28/23 0140)    Followed by  amiodarone (NEXTERONE PREMIX) 360-4.14 MG/200ML-% (1.8 mg/mL) IV infusion (60 mg/hr Intravenous  New  Bag/Given 11/28/23 0138)    Followed by  amiodarone (NEXTERONE PREMIX) 360-4.14 MG/200ML-% (1.8 mg/mL) IV infusion (has no administration in time range)  aspirin EC tablet 81 mg (has no administration in time range)  atorvastatin (LIPITOR) tablet 20 mg (has no administration in time range)  pantoprazole (PROTONIX) EC tablet 40 mg (has no administration in time range)  gabapentin (NEURONTIN) capsule 100 mg (has no administration in time range)  sodium chloride flush (NS) 0.9 % injection 3 mL (has no administration in time range)  sodium chloride flush (NS) 0.9 % injection 3 mL (has no administration in time range)  sodium chloride flush (NS) 0.9 % injection 3 mL (has no administration in time range)  0.9 %  sodium chloride infusion (has no administration in time range)  acetaminophen (TYLENOL) tablet 650 mg (has no administration in time range)    Or  acetaminophen (TYLENOL) suppository 650 mg (has no administration in time range)  ondansetron (ZOFRAN) tablet 4 mg (has no administration in time range)    Or  ondansetron (ZOFRAN) injection 4 mg (has no administration in time range)  levalbuterol (XOPENEX) nebulizer solution 0.63 mg (has no administration in time range)  insulin glargine-yfgn (SEMGLEE) injection 10 Units (has no administration in time range)  metoprolol succinate (TOPROL-XL) 24 hr tablet 25 mg (has no administration in time range)  insulin aspart (novoLOG) injection 0-6 Units (has no administration in time range)  insulin aspart (novoLOG) injection 0-5 Units (has no administration in time range)  ondansetron (ZOFRAN) injection 4 mg (4 mg Intravenous Given 11/28/23 0012)  sodium chloride 0.9 % bolus 500 mL (0 mLs Intravenous Stopped 11/28/23 0143)    ED Course/ Medical Decision Making/ A&P Clinical Course as of 11/28/23 0459  Sat Nov 28, 2023  0035 Consult placed to cardiology [KH]  0045 Spoke with Dr. Lajean Manes on call for cardiology who has reviewed the patient's EKGs in Epic.  Cardiology recommended IV Amiodarone for rate control, no concern for SSS at this time. Would readmit to medicine. Cardiology will see in consultation. [KH]  0403 Spoke with Dr. Janalyn Shy of [facility] who will assess patient in the ED for admission. [KH]    Clinical Course User Index [KH] Antony Madura, PA-C                                 Medical Decision Making Amount and/or Complexity of Data Reviewed Labs: ordered. Radiology: ordered. ECG/medicine tests: ordered.  Risk Prescription drug management. Decision regarding hospitalization.   This patient presents to the ED for concern of syncope, this involves an extensive number of treatment options, and is a complaint that carries with it a high risk of complications and morbidity.  The differential diagnosis includes orthostatic hypotension vs arrhythmia vs ACS vs dehydration vs hypoxemia   Co morbidities that complicate the patient evaluation  COPD CAD DM   Additional history obtained:  Additional history obtained from daughters, at bedside, and EMS personnel on arrival External records from outside source obtained and reviewed including echocardiogram from 11/22/2023; generally reassuring, though suboptimal   Lab Tests:  I Ordered, and personally interpreted labs.  The pertinent results include:  Glucose 238. No electrolyte abnormalities.   Imaging Studies ordered:  I ordered imaging studies including CXR  I independently visualized and interpreted imaging which showed no acute cardiopulmonary abnormality I agree with the radiologist interpretation   Cardiac Monitoring:  The patient was maintained on  a cardiac monitor.  I personally viewed and interpreted the cardiac monitored which showed an underlying rhythm of: sinus tachycardia with episodes of SVT   Medicines ordered and prescription drug management:  I ordered medication including Zofran for nausea and Amiodarone for rate control.  Reevaluation of the patient after  these medicines showed that the patient improved I have reviewed the patients home medicines and have made adjustments as needed   Critical Interventions:  Amiodarone load, infusion.   Consultations Obtained:  I requested consultation with Dr. Lajean Manes of Cardiology and discussed lab and imaging findings as well as pertinent plan - they recommend: IV amiodarone, readmission to medicine service   Problem List / ED Course:  As above   Reevaluation:  After the interventions noted above, I reevaluated the patient and found that they have : remained stable   Social Determinants of Health:  Ambulatory with assist device   Dispostion:  After consideration of the diagnostic results and the patients response to treatment, I feel that the patent would benefit from admission for ongoing management of tachyarrhythmia. VSS during ED course. Dr. Janalyn Shy of [facility] to assess for admission. Cardiology consulting.          Final Clinical Impression(s) / ED Diagnoses Final diagnoses:  Cardiac arrhythmia, unspecified cardiac arrhythmia type    Rx / DC Orders ED Discharge Orders     None         Antony Madura, PA-C 11/28/23 0505    Nira Conn, MD 11/28/23 [phone]

## 2023-11-28 NOTE — Care Management CC44 (Signed)
 Condition Code 44 Documentation Completed  Patient Details  Name: DARCELLE HERRADA MRN: [id number] Date of Birth: 23-May-1951   Condition Code 44 given:  Yes Patient signature on Condition Code 44 notice:  Yes Documentation of 2 MD's agreement:  Yes Code 44 added to claim:  Yes    Lockie Pares, RN 11/28/2023, 2:49 PM

## 2023-11-28 NOTE — ED Notes (Signed)
 total bed change, peri care complete, assisted on bed pan; no other needs at this time

## 2023-11-28 NOTE — Care Management Obs Status (Signed)
 MEDICARE OBSERVATION STATUS NOTIFICATION   Patient Details  Name: Monica Thompson MRN: [id number] Date of Birth: 04/07/1951   Medicare Observation Status Notification Given:  Yes    Lockie Pares, RN 11/28/2023, 2:49 PM

## 2023-11-28 NOTE — Consult Note (Signed)
 Cardiology Consultation   Patient ID: Monica Thompson MRN: [id number]; DOB: 11-08-1950  Admit date: 11/27/2023 Date of Consult: 11/28/2023  PCP:  Kirstie Peri, MD   [facility] Providers Cardiologist:  Prentice Docker, MD (Inactive)        Patient Profile:   Monica Thompson is a 73 y.o. female with a hx of CAD s/p PCI 2016, HTN, COPD who is being seen 11/28/2023 for the evaluation of SVT.  History of Present Illness:   60F with CAD s/p PCI 2016, HTN, and COPD who presents after pre-syncopal episode. She was admitted last week for similar symptoms. At that time EMS had reported and episode of SVT. She was admitted and started on metoprolol and discharged to rehab. She had a repeat episode tonight while using the restroom. She suddenly felt lightheaded and felt like she was going to pass out. She did not fall or hit her head. She was brought in and found to have mid-long RP tachycardia, suspect AT or ORT. She has frequent episodes of SVT on telemetry lasting about 30 seconds before converting to sinus. She is currently asymptomatic. No chest pain, dyspnea, orthopnea, or lower extremity swelling.   Past Medical History:  Diagnosis Date   CAD S/P percutaneous coronary angioplasty 11/13/2014   a. 11/2014 NSTEMI/PCI: LM nl, LAD 80p (3.5x18 Resolute DES), LCX 40p, 84m (3.5x15 Resolute DES), RCA 7m, EF 50%.   Cholecystitis, acute with cholelithiasis 09/10/2013   COPD (chronic obstructive pulmonary disease) (HCC)    Essential hypertension    Hyperlipidemia    Non-ST elevation (NSTEMI) myocardial infarction [facility]) 11/13/2014   NSTEMI (non-ST elevated myocardial infarction) (HCC) 11/30/2014   NSTEMI, initial episode of care (HCC) 11/30/2014   PONV (postoperative nausea and vomiting)    Tobacco abuse     Past Surgical History:  Procedure Laterality Date   ABDOMINAL HYSTERECTOMY     CHOLECYSTECTOMY N/A 09/12/2013   Procedure: LAPAROSCOPIC CHOLECYSTECTOMY;  Surgeon: Dalia Heading, MD;  Location: [facility];  Service: General;  Laterality: N/A;   LEFT HEART CATHETERIZATION WITH CORONARY ANGIOGRAM N/A 11/30/2014   Procedure: LEFT HEART CATHETERIZATION WITH CORONARY ANGIOGRAM;  Surgeon: Lennette Bihari, MD;  Location: [facility] CATH LAB;  Service: Cardiovascular;  Laterality: N/A;   PERCUTANEOUS CORONARY STENT INTERVENTION (PCI-S) N/A 12/04/2014   Procedure: PERCUTANEOUS CORONARY STENT INTERVENTION (PCI-S);  Surgeon: Lennette Bihari, MD;  Location: [facility] C[facility] LAB;  Service: Cardiovascular;  Laterality: N/A;   TONSILLECTOMY       Home Medications:  Prior to Admission medications   Medication Sig Start Date End Date Taking? Authorizing Provider  aspirin EC 81 MG tablet Take 81 mg by mouth daily. Swallow whole.   Yes [provider]  atorvastatin (LIPITOR) 20 MG tablet Take 1 tablet (20 mg total) by mouth daily. 11/28/23  Yes Pokhrel, Laxman, MD  gabapentin (NEURONTIN) 100 MG capsule Take 1 capsule (100 mg total) by mouth at bedtime. 11/27/23  Yes Pokhrel, Laxman, MD  hydrocerin (EUCERIN) CREA Apply 1 Application topically 2 (two) times daily. 11/27/23  Yes Pokhrel, Laxman, MD  insulin glargine-yfgn (SEMGLEE) 100 UNIT/ML Pen Inject 10 Units into the skin at bedtime. 11/27/23  Yes Pokhrel, Rebekah Chesterfield, MD  metFORMIN (GLUCOPHAGE) 500 MG tablet Take 1 tablet (500 mg total) by mouth 2 (two) times daily with a meal. 11/27/23 01/26/24 Yes Pokhrel, Laxman, MD  metoprolol succinate (TOPROL XL) 25 MG 24 hr tablet Take 1 tablet (25 mg total) by mouth daily. 11/27/23 02/25/24 Yes Pokhrel, Laxman,  MD  pantoprazole (PROTONIX) 40 MG tablet Take 40 mg by mouth daily.   Yes [provider]  miconazole (MICOTIN) 2 % cream Apply topically 2 (two) times daily. Patient not taking: Reported on 11/28/2023 11/27/23   Joycelyn Das, MD    Inpatient Medications: Scheduled Meds:  Continuous Infusions:  amiodarone 60 mg/hr (11/28/23 0138)   Followed by   amiodarone     PRN Meds:   Allergies:    No Known Allergies  Social History:   Social History   Socioeconomic History   Marital status: Single    Spouse name: Not on file   Number of children: Not on file   Years of education: Not on file   Highest education level: Not on file  Occupational History   Not on file  Tobacco Use   Smoking status: Former    Current packs/day: 0.00    Types: Cigarettes    Quit date: 11/29/2014    Years since quitting: 9.0   Smokeless tobacco: Never  Vaping Use   Vaping status: Never Used  Substance and Sexual Activity   Alcohol use: No    Alcohol/week: 0.0 standard drinks of alcohol   Drug use: No   Sexual activity: Not on file  Other Topics Concern   Not on file  Social History Narrative   Not on file   Social Drivers of Health   Financial Resource Strain: Not on file  Food Insecurity: No Food Insecurity (11/23/2023)   Hunger Vital Sign    Worried About Running Out of Food in the Last Year: Never true    Ran Out of Food in the Last Year: Never true  Transportation Needs: No Transportation Needs (11/23/2023)   PRAPARE - Administrator, Civil Service (Medical): No    Lack of Transportation (Non-Medical): No  Physical Activity: Not on file  Stress: Not on file  Social Connections: Socially Isolated (11/23/2023)   Social Connection and Isolation Panel [NHANES]    Frequency of Communication with Friends and Family: More than three times a week    Frequency of Social Gatherings with Friends and Family: Three times a week    Attends Religious Services: Never    Active Member of Clubs or Organizations: No    Attends Banker Meetings: Never    Marital Status: Never married  Intimate Partner Violence: Not At Risk (11/23/2023)   Humiliation, Afraid, Rape, and Kick questionnaire    Fear of Current or Ex-Partner: No    Emotionally Abused: No    Physically Abused: No    Sexually Abused: No    Family History:   Family History  Problem Relation Age of Onset    Heart attack Mother    Cancer Father    Deep vein thrombosis Father      ROS:  Please see the history of present illness.  All other ROS reviewed and negative.     Physical Exam/Data:   Vitals:   11/28/23 0205 11/28/23 0215 11/28/23 0220 11/28/23 0225  BP:      Pulse: 73 73 70 71  Resp: 15 14 15 19   Temp:      TempSrc:      SpO2: 98% 99% 100% 97%    Intake/Output Summary (Last 24 hours) at 11/28/2023 0232 Last data filed at 11/28/2023 0143 Gross per 24 hour  Intake 500 ml  Output 200 ml  Net 300 ml      11/27/2023    4:47 AM 11/26/2023  4:49 AM 11/25/2023    5:21 AM  Last 3 Weights  Weight (lbs) 318 lb 2 oz 318 lb 12.6 oz 321 lb 6.9 oz  Weight (kg) 144.3 kg 144.6 kg 145.8 kg     There is no height or weight on file to calculate BMI.  General:  Well nourished, well developed, in no acute distress HEENT: normal Neck: no JVD Vascular: No carotid bruits; Distal pulses 2+ bilaterally Cardiac:  normal S1, S2; RRR; no murmur  Lungs:  clear to auscultation bilaterally, no wheezing, rhonchi or rales  Abd: soft, nontender, no hepatomegaly  Ext: no edema Musculoskeletal:  No deformities Skin: warm and dry  Neuro:  no focal abnormalities noted Psych:  Normal affect    Laboratory Data:  High Sensitivity Troponin:   Recent Labs  Lab 11/22/23 0212 11/22/23 0358 11/27/23 2348  TROPONINIHS 10 13 8      Chemistry Recent Labs  Lab 11/24/23 0229 11/25/23 0255 11/26/23 0303 11/27/23 2348  NA 136 135 135 139  K 4.8 4.2 4.3 4.4  CL 101 100 100 102  CO2 28 23 25 23   GLUCOSE 300* 262* 243* 238*  BUN 24* 20 22 18   CREATININE 0.96 0.71 1.04* 0.85  CALCIUM 8.7* 8.7* 8.8* 9.0  MG 2.0  --   --  1.9  GFRNONAA >60 >60 56* >60  ANIONGAP 7 12 10 14     No results for input(s): "PROT", "ALBUMIN", "AST", "ALT", "ALKPHOS", "BILITOT" in the last 168 hours. Lipids  Recent Labs  Lab 11/25/23 0255  CHOL 239*  TRIG 434*  HDL 37*  LDLCALC UNABLE TO CALCULATE IF TRIGLYCERIDE  OVER 400 mg/dL  CHOLHDL 6.5    Hematology Recent Labs  Lab 11/24/23 0229 11/25/23 0255 11/27/23 2348  WBC 6.8 6.4 8.8  RBC 4.34 4.57 4.82  HGB 12.7 13.1 14.1  HCT 39.1 40.5 43.1  MCV 90.1 88.6 89.4  MCH 29.3 28.7 29.3  MCHC 32.5 32.3 32.7  RDW 13.3 13.2 13.4  PLT 283 290 314   Thyroid  Recent Labs  Lab 11/22/23 0358  TSH 2.884    BNP Recent Labs  Lab 11/22/23 0212 11/27/23 2348  BNP 76.3 73.8    DDimer No results for input(s): "DDIMER" in the last 168 hours.   Radiology/Studies:  DG Chest Port 1 View Result Date: 11/28/2023 CLINICAL DATA:  Tachycardia and shortness of breath EXAM: PORTABLE CHEST 1 VIEW COMPARISON:  11/22/2023 FINDINGS: Cardiac shadow remains enlarged. Aortic calcifications are seen. Skin folds are noted over the right chest. Lungs are well aerated bilaterally. No focal infiltrate or effusion is noted. No bony abnormality is seen. IMPRESSION: No active disease. Electronically Signed   By: Alcide Clever M.D.   On: 11/28/2023 00:14   VAS [place] ABI WITH/WO TBI Result Date: 11/26/2023  LOWER EXTREMITY DOPPLER STUDY Patient Name:  DERRIAN RODAK  Date of Exam:   11/26/2023 Medical Rec #: [id number]        Accession #:    [id number] Date of Birth: Feb 20, 1951        Patient Gender: F Patient Age:   51 years Exam Location:  [facility] Procedure:      VAS [place] ABI WITH/WO TBI Referring Phys: [facility] POKHREL --------------------------------------------------------------------------------  Indications: Claudication. High Risk         Hypertension, hyperlipidemia, Diabetes, coronary artery Factors:          disease.  Limitations: Today's exam was limited due to patient unable to lie flat, patient  positioning and patient intolerant to cuff pressure. Comparison Study: No prior studies. Performing Technologist: Olen Cordial RVT  Examination Guidelines: A complete evaluation includes at minimum, Doppler waveform signals and systolic blood pressure reading at the  level of bilateral brachial, anterior tibial, and posterior tibial arteries, when vessel segments are accessible. Bilateral testing is considered an integral part of a complete examination. Photoelectric Plethysmograph (PPG) waveforms and toe systolic pressure readings are included as required and additional duplex testing as needed. Limited examinations for reoccurring indications may be performed as noted.  ABI Findings: +---------+------------------+-----+-----------+--------+ Right    Rt Pressure (mmHg)IndexWaveform   Comment  +---------+------------------+-----+-----------+--------+ Brachial 190                    triphasic           +---------+------------------+-----+-----------+--------+ PTA      178               0.94 multiphasic         +---------+------------------+-----+-----------+--------+ DP       171               0.90 multiphasic         +---------+------------------+-----+-----------+--------+ Great Toe109               0.57                     +---------+------------------+-----+-----------+--------+ +---------+------------------+-----+-----------+-------+ Left     Lt Pressure (mmHg)IndexWaveform   Comment +---------+------------------+-----+-----------+-------+ Brachial                        triphasic          +---------+------------------+-----+-----------+-------+ PTA      161               0.85 multiphasic        +---------+------------------+-----+-----------+-------+ DP       155               0.82 multiphasic        +---------+------------------+-----+-----------+-------+ Great Toe94                0.49                    +---------+------------------+-----+-----------+-------+ +-------+-----------+-----------+------------+------------+ ABI/TBIToday's ABIToday's TBIPrevious ABIPrevious TBI +-------+-----------+-----------+------------+------------+ Right  0.94       0.57                                 +-------+-----------+-----------+------------+------------+ Left   0.85       0.49                                +-------+-----------+-----------+------------+------------+   Summary: Right: Resting right ankle-brachial index indicates mild right lower extremity arterial disease. The right toe-brachial index is abnormal. Left: Resting left ankle-brachial index indicates mild left lower extremity arterial disease. The left toe-brachial index is abnormal. *See table(s) above for measurements and observations.  Electronically signed by Heath Lark on 11/26/2023 at 4:04:30 PM.    Final      Assessment and Plan:   SVT, mid-long RP tachycardia, likely AT or ORT - recommend starting amiodarone for now - continue metoprolol 25 mg daily - would recommend EP consult to discuss ablation vs switch to alternate rhythm control agent such as tikosyn or sotalol   Risk  Assessment/Risk Scores:                For questions or updates, please contact [facility] Please consult [email or web address] for contact info under    Signed, Lorinda Creed, MD  11/28/2023 2:32 AM

## 2023-11-28 NOTE — Consult Note (Signed)
 Cardiology Consultation   Patient ID: KENIJAH BENNINGFIELD MRN: [id number]; DOB: December 28, 1950  Admit date: 11/27/2023 Date of Consult: 11/28/2023  PCP:  Kirstie Peri, MD   [facility] Providers Cardiologist:  Prentice Docker, MD (Inactive)        Patient Profile:   Monica Thompson is a 73 y.o. female with a hx of coronary artery disease post PCI in 2016, hypertension, COPD, hyperlipidemia, morbid obesity, diabetes, peripheral neuropathy who is being seen 11/28/2023 for the evaluation of SVT at the request of Lucila Maine.  History of Present Illness:   Monica Thompson has a past history as above presented to the emergency room with an episode of near syncope.  She was recently hospitalized with SVT.  She was started on metoprolol.  She had an echo that showed a normal ejection fraction.  She had another episode of SVT and felt lightheaded and presyncopal.  She did not fall.  She states that there are no exacerbating or alleviating factors to her episodes of SVT.  She currently feels well.  She was started on IV amiodarone for short time, though her blood pressure went down and amiodarone was stopped.   Past Medical History:  Diagnosis Date   CAD S/P percutaneous coronary angioplasty 11/13/2014   a. 11/2014 NSTEMI/PCI: LM nl, LAD 80p (3.5x18 Resolute DES), LCX 40p, 31m (3.5x15 Resolute DES), RCA 56m, EF 50%.   Cholecystitis, acute with cholelithiasis 09/10/2013   COPD (chronic obstructive pulmonary disease) (HCC)    Essential hypertension    Hyperlipidemia    Non-ST elevation (NSTEMI) myocardial infarction [facility]) 11/13/2014   NSTEMI (non-ST elevated myocardial infarction) (HCC) 11/30/2014   NSTEMI, initial episode of care (HCC) 11/30/2014   PONV (postoperative nausea and vomiting)    Tobacco abuse     Past Surgical History:  Procedure Laterality Date   ABDOMINAL HYSTERECTOMY     CHOLECYSTECTOMY N/A 09/12/2013   Procedure: LAPAROSCOPIC CHOLECYSTECTOMY;  Surgeon: Dalia Heading, MD;   Location: [facility];  Service: General;  Laterality: N/A;   LEFT HEART CATHETERIZATION WITH CORONARY ANGIOGRAM N/A 11/30/2014   Procedure: LEFT HEART CATHETERIZATION WITH CORONARY ANGIOGRAM;  Surgeon: Lennette Bihari, MD;  Location: [facility] CATH LAB;  Service: Cardiovascular;  Laterality: N/A;   PERCUTANEOUS CORONARY STENT INTERVENTION (PCI-S) N/A 12/04/2014   Procedure: PERCUTANEOUS CORONARY STENT INTERVENTION (PCI-S);  Surgeon: Lennette Bihari, MD;  Location: [facility] CATH LAB;  Service: Cardiovascular;  Laterality: N/A;   TONSILLECTOMY       Home Medications:  Prior to Admission medications   Medication Sig Start Date End Date Taking? Authorizing Provider  aspirin EC 81 MG tablet Take 81 mg by mouth daily. Swallow whole.   Yes [provider]  atorvastatin (LIPITOR) 20 MG tablet Take 1 tablet (20 mg total) by mouth daily. 11/28/23  Yes Pokhrel, Laxman, MD  gabapentin (NEURONTIN) 100 MG capsule Take 1 capsule (100 mg total) by mouth at bedtime. 11/27/23  Yes Pokhrel, Laxman, MD  hydrocerin (EUCERIN) CREA Apply 1 Application topically 2 (two) times daily. 11/27/23  Yes Pokhrel, Laxman, MD  insulin glargine-yfgn (SEMGLEE) 100 UNIT/ML Pen Inject 10 Units into the skin at bedtime. 11/27/23  Yes Pokhrel, Rebekah Chesterfield, MD  metFORMIN (GLUCOPHAGE) 500 MG tablet Take 1 tablet (500 mg total) by mouth 2 (two) times daily with a meal. 11/27/23 01/26/24 Yes Pokhrel, Laxman, MD  metoprolol succinate (TOPROL XL) 25 MG 24 hr tablet Take 1 tablet (25 mg total) by mouth daily. 11/27/23 02/25/24 Yes Pokhrel, Rebekah Chesterfield, MD  pantoprazole (PROTONIX) 40 MG tablet Take 40 mg by mouth daily.   Yes [provider]  miconazole (MICOTIN) 2 % cream Apply topically 2 (two) times daily. Patient not taking: Reported on 11/28/2023 11/27/23   Joycelyn Das, MD    Inpatient Medications: Scheduled Meds:  aspirin EC  81 mg Oral Daily   atorvastatin  20 mg Oral Daily   enoxaparin (LOVENOX) injection  40 mg Subcutaneous Q24H   gabapentin   100 mg Oral QHS   insulin aspart  0-5 Units Subcutaneous QHS   insulin aspart  0-6 Units Subcutaneous TID WC   insulin glargine-yfgn  10 Units Subcutaneous Daily   metoprolol succinate  25 mg Oral Daily   pantoprazole  40 mg Oral Daily   sodium chloride flush  3 mL Intravenous Q12H   sodium chloride flush  3 mL Intravenous Q12H   Continuous Infusions:  sodium chloride     amiodarone     PRN Meds: sodium chloride, acetaminophen **OR** acetaminophen, levalbuterol, ondansetron **OR** ondansetron (ZOFRAN) IV, sodium chloride flush  Allergies:   No Known Allergies  Social History:   Social History   Socioeconomic History   Marital status: Single    Spouse name: Not on file   Number of children: Not on file   Years of education: Not on file   Highest education level: Not on file  Occupational History   Not on file  Tobacco Use   Smoking status: Former    Current packs/day: 0.00    Types: Cigarettes    Quit date: 11/29/2014    Years since quitting: 9.0   Smokeless tobacco: Never  Vaping Use   Vaping status: Never Used  Substance and Sexual Activity   Alcohol use: No    Alcohol/week: 0.0 standard drinks of alcohol   Drug use: No   Sexual activity: Not on file  Other Topics Concern   Not on file  Social History Narrative   Not on file   Social Drivers of Health   Financial Resource Strain: Not on file  Food Insecurity: No Food Insecurity (11/23/2023)   Hunger Vital Sign    Worried About Running Out of Food in the Last Year: Never true    Ran Out of Food in the Last Year: Never true  Transportation Needs: No Transportation Needs (11/23/2023)   PRAPARE - Administrator, Civil Service (Medical): No    Lack of Transportation (Non-Medical): No  Physical Activity: Not on file  Stress: Not on file  Social Connections: Socially Isolated (11/23/2023)   Social Connection and Isolation Panel [NHANES]    Frequency of Communication with Friends and Family: More than  three times a week    Frequency of Social Gatherings with Friends and Family: Three times a week    Attends Religious Services: Never    Active Member of Clubs or Organizations: No    Attends Banker Meetings: Never    Marital Status: Never married  Intimate Partner Violence: Not At Risk (11/23/2023)   Humiliation, Afraid, Rape, and Kick questionnaire    Fear of Current or Ex-Partner: No    Emotionally Abused: No    Physically Abused: No    Sexually Abused: No    Family History:    Family History  Problem Relation Age of Onset   Heart attack Mother    Cancer Father    Deep vein thrombosis Father      ROS:  Please see the history of present illness.  All other ROS reviewed and negative.     Physical Exam/Data:   Vitals:   11/28/23 1026 11/28/23 1030 11/28/23 1045 11/28/23 1100  BP: 111/66 126/68 (!) 139/50   Pulse: 63  63   Resp:  20 16 17   Temp:      TempSrc:      SpO2:  100% 99% 100%    Intake/Output Summary (Last 24 hours) at 11/28/2023 1145 Last data filed at 11/28/2023 0839 Gross per 24 hour  Intake 1500 ml  Output 200 ml  Net 1300 ml      11/27/2023    4:47 AM 11/26/2023    4:49 AM 11/25/2023    5:21 AM  Last 3 Weights  Weight (lbs) 318 lb 2 oz 318 lb 12.6 oz 321 lb 6.9 oz  Weight (kg) 144.3 kg 144.6 kg 145.8 kg     There is no height or weight on file to calculate BMI.  General:  Well nourished, well developed, in no acute distress HEENT: normal Neck: no JVD Vascular: No carotid bruits; Distal pulses 2+ bilaterally Cardiac:  normal S1, S2; RRR; no murmur  Lungs:  clear to auscultation bilaterally, no wheezing, rhonchi or rales  Abd: soft, nontender, no hepatomegaly  Ext: no edema Musculoskeletal:  No deformities, BUE and BLE strength normal and equal Skin: warm and dry  Neuro:  CNs 2-12 intact, no focal abnormalities noted Psych:  Normal affect   EKG:  The EKG was personally reviewed and demonstrates: SVT Telemetry:  Telemetry was  personally reviewed and demonstrates: Sinus rhythm with SVT  Relevant CV Studies: TTE 11/22/2023  1. Images are limited.   2. Left ventricular ejection fraction, by estimation, is 55 to 60%. The  left ventricle has normal function. The left ventricle has no regional  wall motion abnormalities. Left ventricular diastolic parameters are  consistent with Grade I diastolic  dysfunction (impaired relaxation).   3. Right ventricular systolic function is low normal. The right  ventricular size is normal. Tricuspid regurgitation signal is inadequate  for assessing PA pressure.   4. A small pericardial effusion is present. The pericardial effusion is  posterior to the left ventricle.   5. The mitral valve is grossly normal. Trivial mitral valve  regurgitation.   6. The aortic valve was not well visualized. Aortic valve regurgitation  is not visualized.   Laboratory Data:  High Sensitivity Troponin:   Recent Labs  Lab 11/22/23 0212 11/22/23 0358 11/27/23 2348  TROPONINIHS 10 13 8      Chemistry Recent Labs  Lab 11/24/23 0229 11/25/23 0255 11/26/23 0303 11/27/23 2348 11/28/23 0442  NA 136   < > 135 139 137  K 4.8   < > 4.3 4.4 4.5  CL 101   < > 100 102 105  CO2 28   < > 25 23 18*  GLUCOSE 300*   < > 243* 238* 234*  BUN 24*   < > 22 18 17   CREATININE 0.96   < > 1.04* 0.85 0.84  CALCIUM 8.7*   < > 8.8* 9.0 8.4*  MG 2.0  --   --  1.9  --   GFRNONAA >60   < > 56* >60 >60  ANIONGAP 7   < > 10 14 14    < > = values in this interval not displayed.    Recent Labs  Lab 11/28/23 0442  PROT 5.6*  ALBUMIN 2.6*  AST 17  ALT 26  ALKPHOS 71  BILITOT 0.7   Lipids  Recent Labs  Lab 11/25/23 0255  CHOL 239*  TRIG 434*  HDL 37*  LDLCALC UNABLE TO CALCULATE IF TRIGLYCERIDE OVER 400 mg/dL  CHOLHDL 6.5    Hematology Recent Labs  Lab 11/25/23 0255 11/27/23 2348 11/28/23 0442  WBC 6.4 8.8 7.3  RBC 4.57 4.82 4.90  HGB 13.1 14.1 14.3  HCT 40.5 43.1 46.8*  MCV 88.6 89.4 95.5   MCH 28.7 29.3 29.2  MCHC 32.3 32.7 30.6  RDW 13.2 13.4 13.5  PLT 290 314 246   Thyroid  Recent Labs  Lab 11/22/23 0358  TSH 2.884    BNP Recent Labs  Lab 11/22/23 0212 11/27/23 2348  BNP 76.3 73.8    DDimer No results for input(s): "DDIMER" in the last 168 hours.   Radiology/Studies:  DG Chest Port 1 View Result Date: 11/28/2023 CLINICAL DATA:  Tachycardia and shortness of breath EXAM: PORTABLE CHEST 1 VIEW COMPARISON:  11/22/2023 FINDINGS: Cardiac shadow remains enlarged. Aortic calcifications are seen. Skin folds are noted over the right chest. Lungs are well aerated bilaterally. No focal infiltrate or effusion is noted. No bony abnormality is seen. IMPRESSION: No active disease. Electronically Signed   By: Alcide Clever M.D.   On: 11/28/2023 00:14   VAS [place] ABI WITH/WO TBI Result Date: 11/26/2023  LOWER EXTREMITY DOPPLER STUDY Patient Name:  Monica Thompson  Date of Exam:   11/26/2023 Medical Rec #: [id number]        Accession #:    [id number] Date of Birth: [birth year]        Patient Gender: F Patient Age:   55 years Exam Location:  [facility] Procedure:      VAS [place] ABI WITH/WO TBI Referring Phys: [facility] POKHREL --------------------------------------------------------------------------------  Indications: Claudication. High Risk         Hypertension, hyperlipidemia, Diabetes, coronary artery Factors:          disease.  Limitations: Today's exam was limited due to patient unable to lie flat, patient              positioning and patient intolerant to cuff pressure. Comparison Study: No prior studies. Performing Technologist: Olen Cordial RVT  Examination Guidelines: A complete evaluation includes at minimum, Doppler waveform signals and systolic blood pressure reading at the level of bilateral brachial, anterior tibial, and posterior tibial arteries, when vessel segments are accessible. Bilateral testing is considered an integral part of a complete examination. Photoelectric  Plethysmograph (PPG) waveforms and toe systolic pressure readings are included as required and additional duplex testing as needed. Limited examinations for reoccurring indications may be performed as noted.  ABI Findings: +---------+------------------+-----+-----------+--------+ Right    Rt Pressure (mmHg)IndexWaveform   Comment  +---------+------------------+-----+-----------+--------+ Brachial 190                    triphasic           +---------+------------------+-----+-----------+--------+ PTA      178               0.94 multiphasic         +---------+------------------+-----+-----------+--------+ DP       171               0.90 multiphasic         +---------+------------------+-----+-----------+--------+ Great Toe109               0.57                     +---------+------------------+-----+-----------+--------+ +---------+------------------+-----+-----------+-------+ Left  Lt Pressure (mmHg)IndexWaveform   Comment +---------+------------------+-----+-----------+-------+ Brachial                        triphasic          +---------+------------------+-----+-----------+-------+ PTA      161               0.85 multiphasic        +---------+------------------+-----+-----------+-------+ DP       155               0.82 multiphasic        +---------+------------------+-----+-----------+-------+ Great Toe94                0.49                    +---------+------------------+-----+-----------+-------+ +-------+-----------+-----------+------------+------------+ ABI/TBIToday's ABIToday's TBIPrevious ABIPrevious TBI +-------+-----------+-----------+------------+------------+ Right  0.94       0.57                                +-------+-----------+-----------+------------+------------+ Left   0.85       0.49                                +-------+-----------+-----------+------------+------------+   Summary: Right: Resting right  ankle-brachial index indicates mild right lower extremity arterial disease. The right toe-brachial index is abnormal. Left: Resting left ankle-brachial index indicates mild left lower extremity arterial disease. The left toe-brachial index is abnormal. *See table(s) above for measurements and observations.  Electronically signed by Heath Lark on 11/26/2023 at 4:04:30 PM.    Final      Assessment and Plan:   SVT: Has had recurrent episodes of SVT, with presyncopal symptoms.  She was discharged from the hospital yesterday.  She was started on metoprolol.  Monica Thompson increase her current dose of metoprolol.  She did receive amiodarone for short time in the emergency room but this was stopped due to hypotension.  Monica Thompson start metoprolol 50 mg twice daily.  If she tolerates this dose, we Yen Wandell plan for a similar dose at discharge. Coronary artery disease: Reported PCI in 2016.  Continue aspirin 81 mg Hypertension: Well-controlled  Throughout the day today, can be discharged on this dose.  If the patient is discharged, please let cardiology know to arrange follow-up in EP clinic with an app.   Risk Assessment/Risk Scores:                For questions or updates, please contact [facility] Please consult [email or web address] for contact info under    Signed, Yer Olivencia Jorja Loa, MD  11/28/2023 11:45 AM

## 2023-11-28 NOTE — ED Notes (Signed)
 Patient readjusted in bed; personal items within reach; updated patient on plan of care; lights dimmed

## 2023-11-28 NOTE — Progress Notes (Addendum)
 Currently patient is hypotensive map 63 and bedside telemetry showing  HR between 57-60.   Giving 1 L of NS bolus for hypotension.   Obtaining EKG.  If EKG shows sinus rhythm will hold the amiodarone drip.   Update, -Given patient has been converted to sinus rhythm and hypotensive at the same time.  Informed  patient's RN to hold the amiodarone drip at 6:30 AM (Currently on 60 mg/h for 6-hour to finish until 7:30 AM), if patient redevelops SVT will resume the amiodarone drip. -Continue Toprol-XL 25 mg.  Tereasa Coop, MD [facility] 11/28/2023, 6:30 AM

## 2023-11-28 NOTE — H&P (Signed)
 History and Physical    Monica Thompson HQI:[id number] DOB: November 02, 1950 DOA: 11/27/2023  PCP: Kirstie Peri, MD   Patient coming from: SNF   Chief Complaint:  Chief Complaint  Patient presents with   Tachycardia   ED TRIAGE note:Patient BIB EMS from [facility] SNF - patient was assisted to a bedside commode when she had a syncopal episode. Patient was noted to be in Afib & SVT on the way with EMS, vagal maneuvers were successful.   HPI:  Monica Thompson is a 73 y.o. female with medical history significant of significant for CAD status post PCI 2016, essential hypertension, COPD, hyperlipidemia, morbid obesity class III, new onset of DM type II and peripheral neuropathy presented to emergency department after a presyncopal episode.  Patient was admitted last week with similar symptoms. Did last at the hospital admission patient had SVT, started on oral metoprolol and discharged to rehab.  During last admission patient had an echocardiogram which showed EF 55 to 60% and grade 1 diastolic dysfunction.  Patient had a repeated episode of supraventricular tach tonight while using the restroom.  Patient suddenly felt lightheaded and she is going to pass out.  Patient did not actually had a fall.  During my evaluation at the bedside patient denies any chest pain, palpitation, shortness of breath, headache and blurry vision. Reported she has chronic swelling and redness of the bilateral lower extremities.  ED Course:  At presentation to ED heart rate 106, respiratory 20, blood pressure 147/80 and O2 sat 90% room air.   EKG showed supraventricular tachycardia heart rate 171 and sinus pauses. Normal troponin 8. Normal BNP 73. BMP no evidence of electrolyte dehydration and except elevated blood glucose 238. Normal magnesium level 1.9. CBC unremarkable.  The ED patient has been evaluated by cardiology Dr.Agha who recommended IV amiodarone, continue Toprol-XL 25 mg daily.   Cardiology will inform EP consult to discuss for ablation versus switch to alternative rhythm control agent such as Tikosyn or Sotalol.  In the ED patient has been given amiodarone 50 mg bolus and currently on amiodarone drip.  Hospitalist has been contacted for further evaluation and management of SVT.   Significant labs in the ED: Lab Orders         CBC         Magnesium         Basic metabolic panel         Brain natriuretic peptide         CBC         Comprehensive metabolic panel       Review of Systems:  Review of Systems  Constitutional:  Negative for chills, fever and weight loss.  Respiratory:  Negative for cough, sputum production and shortness of breath.   Cardiovascular:  Positive for leg swelling. Negative for chest pain, palpitations and claudication.  Gastrointestinal:  Negative for abdominal pain, constipation, diarrhea, heartburn, nausea and vomiting.  Neurological:  Negative for dizziness, weakness and headaches.  Psychiatric/Behavioral:  The patient is not nervous/anxious.   All other systems reviewed and are negative.   Past Medical History:  Diagnosis Date   CAD S/P percutaneous coronary angioplasty 11/13/2014   a. 11/2014 NSTEMI/PCI: LM nl, LAD 80p (3.5x18 Resolute DES), LCX 40p, 44m (3.5x15 Resolute DES), RCA 71m, EF 50%.   Cholecystitis, acute with cholelithiasis 09/10/2013   COPD (chronic obstructive pulmonary disease) (HCC)    Essential hypertension    Hyperlipidemia    Non-ST elevation (NSTEMI)  myocardial infarction [facility]) 11/13/2014   NSTEMI (non-ST elevated myocardial infarction) (HCC) 11/30/2014   NSTEMI, initial episode of care (HCC) 11/30/2014   PONV (postoperative nausea and vomiting)    Tobacco abuse     Past Surgical History:  Procedure Laterality Date   ABDOMINAL HYSTERECTOMY     CHOLECYSTECTOMY N/A 09/12/2013   Procedure: LAPAROSCOPIC CHOLECYSTECTOMY;  Surgeon: Dalia Heading, MD;  Location: [facility];  Service: General;  Laterality: N/A;    LEFT HEART CATHETERIZATION WITH CORONARY ANGIOGRAM N/A 11/30/2014   Procedure: LEFT HEART CATHETERIZATION WITH CORONARY ANGIOGRAM;  Surgeon: Lennette Bihari, MD;  Location: [facility] C[facility] LAB;  Service: Cardiovascular;  Laterality: N/A;   PERCUTANEOUS CORONARY STENT INTERVENTION (PCI-S) N/A 12/04/2014   Procedure: PERCUTANEOUS CORONARY STENT INTERVENTION (PCI-S);  Surgeon: Lennette Bihari, MD;  Location: [facility] CATH LAB;  Service: Cardiovascular;  Laterality: N/A;   TONSILLECTOMY       reports that she quit smoking about 9 years ago. Her smoking use included cigarettes. She has never used smokeless tobacco. She reports that she does not drink alcohol and does not use drugs.  No Known Allergies  Family History  Problem Relation Age of Onset   Heart attack Mother    Cancer Father    Deep vein thrombosis Father     Prior to Admission medications   Medication Sig Start Date End Date Taking? Authorizing Provider  aspirin EC 81 MG tablet Take 81 mg by mouth daily. Swallow whole.   Yes [provider]  atorvastatin (LIPITOR) 20 MG tablet Take 1 tablet (20 mg total) by mouth daily. 11/28/23  Yes Pokhrel, Laxman, MD  gabapentin (NEURONTIN) 100 MG capsule Take 1 capsule (100 mg total) by mouth at bedtime. 11/27/23  Yes Pokhrel, Laxman, MD  hydrocerin (EUCERIN) CREA Apply 1 Application topically 2 (two) times daily. 11/27/23  Yes Pokhrel, Laxman, MD  insulin glargine-yfgn (SEMGLEE) 100 UNIT/ML Pen Inject 10 Units into the skin at bedtime. 11/27/23  Yes Pokhrel, Rebekah [place], MD  metFORMIN (GLUCOPHAGE) 500 MG tablet Take 1 tablet (500 mg total) by mouth 2 (two) times daily with a meal. 11/27/23 01/26/24 Yes Pokhrel, Laxman, MD  metoprolol succinate (TOPROL XL) 25 MG 24 hr tablet Take 1 tablet (25 mg total) by mouth daily. 11/27/23 02/25/24 Yes Pokhrel, Laxman, MD  pantoprazole (PROTONIX) 40 MG tablet Take 40 mg by mouth daily.   Yes [provider]  miconazole (MICOTIN) 2 % cream Apply topically 2 (two)  times daily. Patient not taking: Reported on 11/28/2023 11/27/23   Joycelyn Das, MD     Physical Exam: Vitals:   11/28/23 0225 11/28/23 0300 11/28/23 0330 11/28/23 0400  BP:  111/61 (!) 101/58   Pulse: 71 63 (!) 57   Resp: 19 18 16    Temp:    98 F (36.7 C)  TempSrc:    Oral  SpO2: 97% 98% 96%     Physical Exam Constitutional:      Appearance: She is obese. She is ill-appearing.  HENT:     Mouth/Throat:     Mouth: Mucous membranes are moist.  Eyes:     Conjunctiva/sclera: Conjunctivae normal.  Cardiovascular:     Rate and Rhythm: Normal rate and regular rhythm.     Pulses: Normal pulses.     Heart sounds: Normal heart sounds.  Pulmonary:     Effort: Pulmonary effort is normal.     Breath sounds: Normal breath sounds.  Abdominal:     General: Bowel sounds are normal.  Musculoskeletal:  General: Swelling present. No tenderness or signs of injury.     Cervical back: Neck supple.     Right lower leg: Edema present.     Left lower leg: Edema present.  Skin:    Capillary Refill: Capillary refill takes less than 2 seconds.  Neurological:     Mental Status: She is alert and oriented to person, place, and time.  Psychiatric:        Mood and Affect: Mood normal.      Labs on Admission: I have personally reviewed following labs and imaging studies  CBC: Recent Labs  Lab 11/22/23 0212 11/24/23 0229 11/25/23 0255 11/27/23 2348 11/28/23 0442  WBC 8.8 6.8 6.4 8.8 7.3  NEUTROABS 6.2  --   --   --   --   HGB 15.4* 12.7 13.1 14.1 14.3  HCT 46.8* 39.1 40.5 43.1 46.8*  MCV 89.1 90.1 88.6 89.4 95.5  PLT 341 283 290 314 246   Basic Metabolic Panel: Recent Labs  Lab 11/22/23 0212 11/24/23 0229 11/25/23 0255 11/26/23 0303 11/27/23 2348  NA 137 136 135 135 139  K 4.3 4.8 4.2 4.3 4.4  CL 103 101 100 100 102  CO2 22 28 23 25 23   GLUCOSE 403* 300* 262* 243* 238*  BUN 23 24* 20 22 18   CREATININE 1.00 0.96 0.71 1.04* 0.85  CALCIUM 9.2 8.7* 8.7* 8.8* 9.0  MG   --  2.0  --   --  1.9   GFR: CrCl cannot be calculated (Unknown ideal weight.). Liver Function Tests: No results for input(s): "AST", "ALT", "ALKPHOS", "BILITOT", "PROT", "ALBUMIN" in the last 168 hours. No results for input(s): "LIPASE", "AMYLASE" in the last 168 hours. No results for input(s): "AMMONIA" in the last 168 hours. Coagulation Profile: No results for input(s): "INR", "PROTIME" in the last 168 hours. Cardiac Enzymes: Recent Labs  Lab 11/22/23 0212 11/22/23 0358 11/27/23 2348  TROPONINIHS 10 13 8    BNP (last 3 results) Recent Labs    11/22/23 0212 11/27/23 2348  BNP 76.3 73.8   HbA1C: No results for input(s): "HGBA1C" in the last 72 hours. CBG: Recent Labs  Lab 11/26/23 0620 11/26/23 1120 11/26/23 1533 11/26/23 2144 11/27/23 0623  GLUCAP 242* 259* 282* 162* 220*   Lipid Profile: No results for input(s): "CHOL", "HDL", "LDLCALC", "TRIG", "CHOLHDL", "LDLDIRECT" in the last 72 hours. Thyroid Function Tests: No results for input(s): "TSH", "T4TOTAL", "FREET4", "T3FREE", "THYROIDAB" in the last 72 hours. Anemia Panel: No results for input(s): "VITAMINB12", "FOLATE", "FERRITIN", "TIBC", "IRON", "RETICCTPCT" in the last 72 hours. Urine analysis:    Component Value Date/Time   COLORURINE YELLOW 09/10/2013 0435   APPEARANCEUR CLEAR 09/10/2013 0435   LABSPEC >1.030 (H) 09/10/2013 0435   PHURINE 6.0 09/10/2013 0435   GLUCOSEU NEGATIVE 09/10/2013 0435   HGBUR NEGATIVE 09/10/2013 0435   BILIRUBINUR NEGATIVE 09/10/2013 0435   KETONESUR NEGATIVE 09/10/2013 0435   PROTEINUR NEGATIVE 09/10/2013 0435   UROBILINOGEN 0.2 09/10/2013 0435   NITRITE NEGATIVE 09/10/2013 0435   LEUKOCYTESUR NEGATIVE 09/10/2013 0435    Radiological Exams on Admission: I have personally reviewed images DG Chest Port 1 View Result Date: 11/28/2023 CLINICAL DATA:  Tachycardia and shortness of breath EXAM: PORTABLE CHEST 1 VIEW COMPARISON:  11/22/2023 FINDINGS: Cardiac shadow remains  enlarged. Aortic calcifications are seen. Skin folds are noted over the right chest. Lungs are well aerated bilaterally. No focal infiltrate or effusion is noted. No bony abnormality is seen. IMPRESSION: No active disease. Electronically Signed   By:  Alcide Clever M.D.   On: 11/28/2023 00:14   VAS [place] ABI WITH/WO TBI Result Date: 11/26/2023  LOWER EXTREMITY DOPPLER STUDY Patient Name:  GENNETT GARCIA  Date of Exam:   11/26/2023 Medical Rec #: [id number]        Accession #:    [id number] Date of Birth: 1951/05/07        Patient Gender: F Patient Age:   33 years Exam Location:  [facility] Procedure:      VAS [place] ABI WITH/WO TBI Referring Phys: [facility] POKHREL --------------------------------------------------------------------------------  Indications: Claudication. High Risk         Hypertension, hyperlipidemia, Diabetes, coronary artery Factors:          disease.  Limitations: Today's exam was limited due to patient unable to lie flat, patient              positioning and patient intolerant to cuff pressure. Comparison Study: No prior studies. Performing Technologist: Olen Cordial RVT  Examination Guidelines: A complete evaluation includes at minimum, Doppler waveform signals and systolic blood pressure reading at the level of bilateral brachial, anterior tibial, and posterior tibial arteries, when vessel segments are accessible. Bilateral testing is considered an integral part of a complete examination. Photoelectric Plethysmograph (PPG) waveforms and toe systolic pressure readings are included as required and additional duplex testing as needed. Limited examinations for reoccurring indications may be performed as noted.  ABI Findings: +---------+------------------+-----+-----------+--------+ Right    Rt Pressure (mmHg)IndexWaveform   Comment  +---------+------------------+-----+-----------+--------+ Brachial 190                    triphasic            +---------+------------------+-----+-----------+--------+ PTA      178               0.94 multiphasic         +---------+------------------+-----+-----------+--------+ DP       171               0.90 multiphasic         +---------+------------------+-----+-----------+--------+ Great Toe109               0.57                     +---------+------------------+-----+-----------+--------+ +---------+------------------+-----+-----------+-------+ Left     Lt Pressure (mmHg)IndexWaveform   Comment +---------+------------------+-----+-----------+-------+ Brachial                        triphasic          +---------+------------------+-----+-----------+-------+ PTA      161               0.85 multiphasic        +---------+------------------+-----+-----------+-------+ DP       155               0.82 multiphasic        +---------+------------------+-----+-----------+-------+ Great Toe94                0.49                    +---------+------------------+-----+-----------+-------+ +-------+-----------+-----------+------------+------------+ ABI/TBIToday's ABIToday's TBIPrevious ABIPrevious TBI +-------+-----------+-----------+------------+------------+ Right  0.94       0.57                                +-------+-----------+-----------+------------+------------+ Left   0.85  0.49                                +-------+-----------+-----------+------------+------------+   Summary: Right: Resting right ankle-brachial index indicates mild right lower extremity arterial disease. The right toe-brachial index is abnormal. Left: Resting left ankle-brachial index indicates mild left lower extremity arterial disease. The left toe-brachial index is abnormal. *See table(s) above for measurements and observations.  Electronically signed by Heath Lark on 11/26/2023 at 4:04:30 PM.    Final      EKG: My personal interpretation of EKG shows: Super ventral  tachycardia heart rate 171.    Assessment/Plan: Principal Problem:   SVT (supraventricular tachycardia) (HCC) Active Problems:   COPD (chronic obstructive pulmonary disease) (HCC)   Hyperlipidemia   CAD S/P percutaneous coronary angioplasty   Hypertension   Insulin dependent type 2 diabetes mellitus (HCC)   CKD (chronic kidney disease), stage II   Peripheral neuropathy    Assessment and Plan: Supraventricular tachycardia -Patient presenting emergency department via EMS for a presyncopal episode while in the bathroom.  Found to have supraventricular tachycardia via EMS and slight improvement with vagal maneuver. -Patient was discharged from the hospital 11/27/23 and she was admitted with SVT and being started on Toprol-XL.  - Return to ED patient is hemodynamically stable.  EKG showed supraventricular Picardi heart rate 171. - Cardiology has been evaluated patient in the ED, recommended IV amiodarone.  Cardiology recommended  EP to discuss ablation versus switch to Tikosyn or sotalol.  Recommended to continue Toprol-XL. -In the patient has been given amiodarone 150 mg and currently on amiodarone drip. - Patient is still in and out of SVT. - Need to reach out to electrophysiologist in the daytime to discuss regarding cardioversion further versus medical management - Echocardiogram from 11/22/2023 showed EF 55 to 60% and grade 1 diastolic heart failure. - 11/22/2023 showed TSH 2.8 within normal range. - Normal troponin.Normal BNP.  Normal mag level.  BMP no evidence of electrolyte derangement. - Plan to continue amiodarone drip. -Continue Toprol-XL 25 mg daily. - Need to reach out to electrophysiologist in the daytime to discuss regarding cardioversion further versus medical management.   Presyncopal event secondary to SVT -Dizziness/presyncope secondary to in the setting of SVT.  Patient denies any loss of consciousness and fall. -Continue fall precaution monitor vitals.  History of  CAD status post PCI 2016  -Continue aspirin 81 mg daily, Lipitor 20 mg intrapersonal XL 25 mg daily.  Essential hypertension -Patient is normotensive.  Blood pressure within good range. -Continue Toprol-XL 25 mg daily.  Insulin-dependent DM type II -A1c 10.4 checked on 11/22/2023 -Elevated blood glucose around 250. - Continue Semglee 10 units, low sliding scale and at bedtime insulin as needed coverage.  CKD stage II -Creatinine 0.85.  Renal function has been improved and at baseline.  Continue to monitor  Peripheral neuropathy -Continue gabapentin 100 mg at bedtime  History of COPD -Stable. -Continue Xopenex as needed.   DVT prophylaxis:  Lovenox Code Status:  Full Code Diet: Heart healthy carb modified Family Communication: No family member at bedside now Disposition Plan: Continue monitor improvement of the heart rate. Consults: Cardiology Admission status:   Inpatient, Step Down Unit  Severity of Illness: The appropriate patient status for this patient is INPATIENT. Inpatient status is judged to be reasonable and necessary in order to provide the required intensity of service to ensure the patient's safety. The patient's presenting symptoms, physical exam  findings, and initial radiographic and laboratory data in the context of their chronic comorbidities is felt to place them at high risk for further clinical deterioration. Furthermore, it is not anticipated that the patient will be medically stable for discharge from the hospital within 2 midnights of admission.   * I certify that at the point of admission it is my clinical judgment that the patient will require inpatient hospital care spanning beyond 2 midnights from the point of admission due to high intensity of service, high risk for further deterioration and high frequency of surveillance required.Marland Kitchen    Tereasa Coop, MD [facility]  How to contact the [facility] Attending or Consulting provider 7A - 7P or covering  provider during after hours 7P -7A, for this patient.  Check the care team in [facility] and look for a) attending/consulting [facility] provider listed and b) the [facility] team listed Log into [email or web address] and use [facility] universal password to access. If you do not have the password, please contact the hospital operator. Locate the [facility] provider you are looking for under [facility] and page to a number that you can be directly reached. If you still have difficulty reaching the provider, please page the [facility] (Director on Call) for the Hospitalists listed on amion for assistance.  11/28/2023, 5:33 AM

## 2023-11-28 NOTE — ED Notes (Signed)
 Lab called, they are adding on the HFP, Trop, and BNP.

## 2023-11-29 ENCOUNTER — Observation Stay (HOSPITAL_COMMUNITY): Payer: Medicare HMO

## 2023-11-29 ENCOUNTER — Other Ambulatory Visit: Payer: Self-pay

## 2023-11-29 DIAGNOSIS — I471 Supraventricular tachycardia, unspecified: Secondary | ICD-10-CM | POA: Diagnosis not present

## 2023-11-29 LAB — BASIC METABOLIC PANEL
Anion gap: 11 (ref 5–15)
BUN: 12 mg/dL (ref 8–23)
CO2: 24 mmol/L (ref 22–32)
Calcium: 8.5 mg/dL — ABNORMAL LOW (ref 8.9–10.3)
Chloride: 104 mmol/L (ref 98–111)
Creatinine, Ser: 0.82 mg/dL (ref 0.44–1.00)
GFR, Estimated: >60 mL/min (ref >60)
Glucose, Bld: 170 mg/dL — ABNORMAL HIGH (ref 70–99)
Potassium: 4.2 mmol/L (ref 3.5–5.1)
Sodium: 139 mmol/L (ref 135–145)

## 2023-11-29 LAB — CBC WITH DIFFERENTIAL/PLATELET
Abs Immature Granulocytes: 0.01 10*3/uL (ref 0.00–0.07)
Basophils Absolute: 0 10*3/uL (ref 0.0–0.1)
Basophils Relative: 0 %
Eosinophils Absolute: 0.1 10*3/uL (ref 0.0–0.5)
Eosinophils Relative: 2 %
HCT: 41 % (ref 36.0–46.0)
Hemoglobin: 13.1 g/dL (ref 12.0–15.0)
Immature Granulocytes: 0 %
Lymphocytes Relative: 38 %
Lymphs Abs: 2.5 10*3/uL (ref 0.7–4.0)
MCH: 28.6 pg (ref 26.0–34.0)
MCHC: 32 g/dL (ref 30.0–36.0)
MCV: 89.5 fL (ref 80.0–100.0)
Monocytes Absolute: 0.4 10*3/uL (ref 0.1–1.0)
Monocytes Relative: 6 %
Neutro Abs: 3.5 10*3/uL (ref 1.7–7.7)
Neutrophils Relative %: 54 %
Platelets: 182 10*3/uL (ref 150–400)
RBC: 4.58 MIL/uL (ref 3.87–5.11)
RDW: 13.5 % (ref 11.5–15.5)
WBC: 6.6 10*3/uL (ref 4.0–10.5)
nRBC: 0 % (ref 0.0–0.2)

## 2023-11-29 LAB — GLUCOSE, CAPILLARY
Glucose-Capillary: 159 mg/dL — ABNORMAL HIGH (ref 70–99)
Glucose-Capillary: 172 mg/dL — ABNORMAL HIGH (ref 70–99)
Glucose-Capillary: 173 mg/dL — ABNORMAL HIGH (ref 70–99)
Glucose-Capillary: 154 mg/dL — ABNORMAL HIGH (ref 70–99)

## 2023-11-29 MED ORDER — TRAMADOL HCL 50 MG PO TABS
25.0000 mg | ORAL_TABLET | Freq: Once | ORAL | Status: AC
  Administered 2023-11-29: 25 mg via ORAL
  Filled 2023-11-29: qty 1

## 2023-11-29 MED ORDER — PROMETHAZINE HCL 25 MG PO TABS
25.0000 mg | ORAL_TABLET | Freq: Four times a day (QID) | ORAL | Status: DC | PRN
  Administered 2023-11-30: 25 mg via ORAL
  Filled 2023-11-29 (×2): qty 1

## 2023-11-29 NOTE — Progress Notes (Signed)
 Dr. Elberta Fortis recommended gen cards f/u in [place], outlined on AVS.

## 2023-11-29 NOTE — Plan of Care (Signed)
  Problem: Education: Goal: Ability to describe self-care measures that may prevent or decrease complications (Diabetes Survival Skills Education) will improve Outcome: Progressing Goal: Individualized Educational Video(s) Outcome: Progressing   Problem: Coping: Goal: Ability to adjust to condition or change in health will improve Outcome: Progressing   Problem: Fluid Volume: Goal: Ability to maintain a balanced intake and output will improve Outcome: Progressing   Problem: Health Behavior/Discharge Planning: Goal: Ability to manage health-related needs will improve Outcome: Progressing   Problem: Metabolic: Goal: Ability to maintain appropriate glucose levels will improve Outcome: Progressing   Problem: Nutritional: Goal: Maintenance of adequate nutrition will improve Outcome: Progressing   Problem: Skin Integrity: Goal: Risk for impaired skin integrity will decrease Outcome: Progressing   Problem: Tissue Perfusion: Goal: Adequacy of tissue perfusion will improve Outcome: Progressing   Problem: Health Behavior/Discharge Planning: Goal: Ability to manage health-related needs will improve Outcome: Progressing   Problem: Clinical Measurements: Goal: Ability to maintain clinical measurements within normal limits will improve Outcome: Progressing Goal: Will remain free from infection Outcome: Progressing Goal: Diagnostic test results will improve Outcome: Progressing Goal: Respiratory complications will improve Outcome: Progressing   Problem: Activity: Goal: Risk for activity intolerance will decrease Outcome: Progressing   Problem: Nutrition: Goal: Adequate nutrition will be maintained Outcome: Progressing   Problem: Coping: Goal: Level of anxiety will decrease Outcome: Progressing   Problem: Elimination: Goal: Will not experience complications related to bowel motility Outcome: Progressing   Problem: Safety: Goal: Ability to remain free from injury will  improve Outcome: Progressing   Problem: Skin Integrity: Goal: Risk for impaired skin integrity will decrease Outcome: Progressing

## 2023-11-29 NOTE — Progress Notes (Signed)
 Progress Note   Patient: Monica Thompson NGE:[id number] DOB: [birth year] DOA: 11/27/2023     1 DOS: the patient was seen and examined on 11/29/2023   Brief hospital course: 73yo with h/o CAD s/p PCI, HTN, COPD, HTN, DM, and morbid obesity who presented on 2/14 with presyncope.  She was noted to be in SVT and this improved with vagal maneuvers.  She had a recent similar admission, Echo with preserved EF and grade 1 diastolic dysfunction.  Cardiology consulted overnight and recommend Amiodarone infusion + continued Toprol XL 25 mg daily.  EP consulted.  HR was controlled on the night of admission and she developed mild hypotension and so amiodarone was held and she was given a bolus.  Cardiology has consulted and recommends increasing dose of metoprolol and outpatient f/u. No further episodes of SVT noted.  Assessment and Plan:  Supraventricular tachycardia Patient previously hospitalized from 2/9-14 with syncope associated with SVT She was started on Toprol XL and discharged to SNF rehab She returned the same of discharge with recurrent symptoms EKG showed supraventricular tachycardia with heart rate 171 Cardiology evaluated on the night of admission and recommended IV amiodarone with continued Toprol-XL. BP decreased, HR converted, and amiodarone was stopped Echocardiogram from 11/22/2023 showed EF 55 to 60% and grade 1 diastolic heart failure TSH, troponin, BNP, Mag++ all WNL EP consulted and recommended increasing metoprolol to 50 mg BID Monitored on telemetry overnight without recurrence Will get OOB to chair to ensure that this level of exertion does not precipitate recurrence If no further event (HR in the 60s since admission so far), will dc back to SNF rehab today Planned for outpatient cardiology/EP follow-up  Nausea Patient reported nausea today Her daughter reports nausea particularly after eating since the episode Friday at her facility Normal LFTs on presentation S/p  cholecystectomy Low current suspicion for serious disease based on evaluation Will continue to monitor for an additional night to ensure no worsening Consider CT tomorrow (or sooner), should circumstances warrant this   Presyncopal event secondary to SVT Dizziness/presyncope secondary to in the setting of SVT Patient denies any loss of consciousness Continue fall precautions    History of CAD  S/p PCI 2016  Continue aspirin 81 mg daily Started on low-dose Lipitor during last hospitalization   Essential hypertension Metoprolol increased to 50 mg BID   Insulin-dependent DM type II A1c 10.4, very  poor control Continue Semglee 10 units Resume metformin Continue gabapentin   Peripheral neuropathy Continue gabapentin    History of COPD Stable, appears compensated   Morbid obesity Body mass index is 54.95 kg/m.  Weight loss should be encouraged Outpatient PCP/bariatric medicine f/u encouraged          Consultants: Cardiology/EP   Procedures: None   Antibiotics: None  30 Day Unplanned Readmission Risk Score    Flowsheet Row ED to Hosp-Admission (Current) from 11/27/2023 in [facility]  30 Day Unplanned Readmission Risk Score (%) 12.66 Filed at 11/28/2023 1200       This score is the patient's risk of an unplanned readmission within 30 days of being discharged (0 -100%). The score is based on dignosis, age, lab data, medications, orders, and past utilization.   Low:  0-14.9   Medium: 15-21.9   High: 22-29.9   Extreme: 30 and above           Subjective: Periodic nausea episodes last evening.  She was concerned that this was related to recurrent SVT but her HR has  been controlled since admission.  She reports some difficult in voiding with the Purewick but this appears to be positional (and the canister is full of urine).  She has not been out of bed.  RN got her up recently and her HR stayed between 75-94.   I spoke with her daughter, has  been nauseated since yesterday.  She is continuing to feel nauseated.  She also felt nauseated on Friday.  She had diarrhea in the ER x 1 after getting amiodarone.  Her daughter is concerned about another quick return trip to the ER and would like for her to continue to be monitored prior to dc.   Objective: Vitals:   11/29/23 0743 11/29/23 1345  BP: (!) 121/58 (!) 137/96  Pulse:    Resp: 14   Temp:    SpO2: 90%     Intake/Output Summary (Last 24 hours) at 11/29/2023 1412 Last data filed at 11/29/2023 1100 Gross per 24 hour  Intake 0 ml  Output 800 ml  Net -800 ml   Filed Weights   11/28/23 1522  Weight: (!) 145.2 kg    Exam:  General:  Appears calm and comfortable and is in NAD Eyes:   EOMI, normal lids, iris ENT:  grossly normal hearing, lips & tongue, mmm Neck:  no LAD, masses or thyromegaly Cardiovascular:  RRR. No LE edema.  Respiratory:   CTA bilaterally with no wheezes/rales/rhonchi.  Normal respiratory effort. Abdomen:  soft, NT, ND Skin:  no rash or induration seen on limited exam Musculoskeletal:  grossly normal tone BUE/BLE, good ROM, no bony abnormality Psychiatric:  grossly normal mood and affect, speech fluent and appropriate, AOx3 Neurologic:  CN 2-12 grossly intact, moves all extremities in coordinated fashion  Data Reviewed: I have reviewed the patient's lab results since admission.  Pertinent labs for today include:   Glucose 170 Normal CBC     Family Communication: None present; I spoke with her daughter by telephone  Disposition: Status is: Observation The patient remains OBS appropriate and will d/c before 2 midnights.     Time spent: 50 minutes  Unresulted Labs (From admission, onward)     Start     Ordered   11/30/23 0500  Comprehensive metabolic panel  Tomorrow morning,   R        11/29/23 1412   11/30/23 0500  CBC with Differential/Platelet  Tomorrow morning,   R        11/29/23 1412             Author: Jonah Blue,  MD 11/29/2023 2:12 PM  For on call review [email or web address].

## 2023-11-29 NOTE — Plan of Care (Signed)

## 2023-11-29 NOTE — Hospital Course (Signed)
 73yo with h/o CAD s/p PCI, HTN, COPD, HTN, DM, and morbid obesity who presented on 2/14 with presyncope.  She was noted to be in SVT and this improved with vagal maneuvers.  She had a recent similar admission, Echo with preserved EF and grade 1 diastolic dysfunction.  Cardiology consulted overnight and recommend Amiodarone infusion + continued Toprol XL 25 mg daily.  EP consulted.  HR was controlled on the night of admission and she developed mild hypotension and so amiodarone was held and she was given a bolus.  Cardiology has consulted and recommends increasing dose of metoprolol and outpatient f/u. No further episodes of SVT noted.

## 2023-11-29 NOTE — Progress Notes (Signed)
 Rounding Note    Patient Name: Monica Thompson Date of Encounter: 11/29/2023  [facility] Cardiologist: Prentice Docker, MD (Inactive)   Subjective   No acute complaints.  No further episodes of SVT.  Inpatient Medications    Scheduled Meds:  aspirin EC  81 mg Oral Daily   atorvastatin  20 mg Oral Daily   enoxaparin (LOVENOX) injection  40 mg Subcutaneous Q24H   gabapentin  100 mg Oral QHS   insulin aspart  0-20 Units Subcutaneous TID WC   insulin aspart  0-5 Units Subcutaneous QHS   insulin glargine-yfgn  10 Units Subcutaneous Daily   metoprolol tartrate  50 mg Oral BID   ondansetron (ZOFRAN) IV  4 mg Intravenous Once   pantoprazole  40 mg Oral Daily   sodium chloride flush  3 mL Intravenous Q12H   sodium chloride flush  3 mL Intravenous Q12H   Continuous Infusions:  amiodarone     PRN Meds: acetaminophen **OR** acetaminophen, levalbuterol, ondansetron **OR** ondansetron (ZOFRAN) IV, sodium chloride flush   Vital Signs    Vitals:   11/28/23 2355 11/29/23 0025 11/29/23 0050 11/29/23 0743  BP: (!) 115/58 119/66 (!) 146/64 (!) 121/58  Pulse: 60 60 (!) 57   Resp: 18 20 18 14   Temp: 98.2 F (36.8 C)  98.4 F (36.9 C)   TempSrc: Oral  Oral Oral  SpO2: 92% 94% 97% 90%  Weight:      Height:        Intake/Output Summary (Last 24 hours) at 11/29/2023 0814 Last data filed at 11/29/2023 0700 Gross per 24 hour  Intake 1000 ml  Output --  Net 1000 ml      11/28/2023    3:22 PM 11/27/2023    4:47 AM 11/26/2023    4:49 AM  Last 3 Weights  Weight (lbs) 320 lb 1.7 oz 318 lb 2 oz 318 lb 12.6 oz  Weight (kg) 145.2 kg 144.3 kg 144.6 kg      Telemetry    Sinus rhythm- Personally Reviewed  ECG     - Personally Reviewed  Physical Exam   GEN: No acute distress.   Neck: No JVD Cardiac: RRR, no murmurs, rubs, or gallops.  Respiratory: Clear to auscultation bilaterally. GI: Soft, nontender, non-distended  MS: No edema; No deformity. Neuro:  Nonfocal   Psych: Normal affect   Labs    High Sensitivity Troponin:   Recent Labs  Lab 11/22/23 0212 11/22/23 0358 11/27/23 2348  TROPONINIHS 10 13 8      Chemistry Recent Labs  Lab 11/24/23 0229 11/25/23 0255 11/26/23 0303 11/27/23 2348 11/28/23 0442  NA 136   < > 135 139 137  K 4.8   < > 4.3 4.4 4.5  CL 101   < > 100 102 105  CO2 28   < > 25 23 18*  GLUCOSE 300*   < > 243* 238* 234*  BUN 24*   < > 22 18 17   CREATININE 0.96   < > 1.04* 0.85 0.84  CALCIUM 8.7*   < > 8.8* 9.0 8.4*  MG 2.0  --   --  1.9  --   PROT  --   --   --   --  5.6*  ALBUMIN  --   --   --   --  2.6*  AST  --   --   --   --  17  ALT  --   --   --   --  26  ALKPHOS  --   --   --   --  71  BILITOT  --   --   --   --  0.7  GFRNONAA >60   < > 56* >60 >60  ANIONGAP 7   < > 10 14 14    < > = values in this interval not displayed.    Lipids  Recent Labs  Lab 11/25/23 0255  CHOL 239*  TRIG 434*  HDL 37*  LDLCALC UNABLE TO CALCULATE IF TRIGLYCERIDE OVER 400 mg/dL  CHOLHDL 6.5    Hematology Recent Labs  Lab 11/25/23 0255 11/27/23 2348 11/28/23 0442  WBC 6.4 8.8 7.3  RBC 4.57 4.82 4.90  HGB 13.1 14.1 14.3  HCT 40.5 43.1 46.8*  MCV 88.6 89.4 95.5  MCH 28.7 29.3 29.2  MCHC 32.3 32.7 30.6  RDW 13.2 13.4 13.5  PLT 290 314 246   Thyroid No results for input(s): "TSH", "FREET4" in the last 168 hours.  BNP Recent Labs  Lab 11/27/23 2348  BNP 73.8    DDimer No results for input(s): "DDIMER" in the last 168 hours.   Radiology    DG Chest Port 1 View Result Date: 11/28/2023 CLINICAL DATA:  Tachycardia and shortness of breath EXAM: PORTABLE CHEST 1 VIEW COMPARISON:  11/22/2023 FINDINGS: Cardiac shadow remains enlarged. Aortic calcifications are seen. Skin folds are noted over the right chest. Lungs are well aerated bilaterally. No focal infiltrate or effusion is noted. No bony abnormality is seen. IMPRESSION: No active disease. Electronically Signed   By: Alcide Clever M.D.   On: 11/28/2023 00:14     Cardiac Studies     Patient Profile     73 y.o. female history of coronary artery disease post PCI in 2016, hypertension, COPD, hyperlipidemia, morbid obesity, diabetes, peripheral neuropathy presented to the hospital with SVT  Assessment & Plan    1.  SVT: No further episodes.  Currently on metoprolol 50 mg twice daily.  Heart rate and blood pressure well-controlled.  Would continue current medications.  2.  Coronary artery disease: PCI in 2016.  Continue aspirin  3.  Hypertension: Currently well-controlled   [facility] Eluterio Seymour sign off.   Medication Recommendations: Metoprolol 50 mg twice daily Other recommendations (labs, testing, etc):   Follow up as an outpatient: Madell Heino be arranged in cardiology clinic  For questions or updates, please contact [facility] Please consult [email or web address] for contact info under        Signed, Lynnita Somma Jorja Loa, MD  11/29/2023, 8:14 AM

## 2023-11-29 NOTE — Progress Notes (Addendum)
 Patient's TB skin test was read by day shift RN at 0.8 cm. MD Toniann Fail notified

## 2023-11-29 NOTE — TOC Transition Note (Signed)
 Transition of Care [facility]) - Discharge Note   Patient Details  Name: Monica Thompson MRN: [id number] Date of Birth: [birth year]  Transition of Care [facility]) CM/SW Contact:  Helene Kelp, LCSW Phone Number: 11/29/2023, 11:40 AM  Clinical Narrative:    CSW followed-up the patient's disposition (transfer to the SNF -- [facility]). CSW was contacted by provider (via chat) to facilitate the patient's return back to the facility (SNF).  CSW contacted the patient's facility [facility]) and spoke with the representative ([name]). CSW provided update informtion regarding the patient's current status and reviewed discharge process for the patient's return to the facility. The rep noted all information RN report # and room number as remained the same.   CSW contacted the patient's natural support [name]). CSW updated  the patient's current disposition and when transport to the facility will take place. [name] stated she wanted to make sure the patient is medically clear to go back to the SNF based on the patient presenting back to the ED after being d/c on 11/27/2023.  Note: This Clinical research associate updated the MD via chat about the daughter request to be contacted for a medically clear information to prevent the patient coming back to the hospital today.   CSW contacted [facility] and arranged transport to the facility [facility]). CSW spoke with [facility] representative and transport was arranged.  CSW updated the clinical team members on the floor to efforts made to facilitate pt's disposition (transfer to SNF - [facility]) and provided [facility] transport date and time of transport.  Date: 11/29/2023 Time: 2:30 PM  Transfer process update: CSW contacted pt family/natural supports ([name]) to update pt's disposition (transfer to noted facility, accepting of patient's referral and transfer date and time). CSW arranged ambulance transport via [facility] to [facility]).   CSW provided RN with  call report number [phone]) prior to discharge. CSW provied Cn with discharge documentation needed for transfer at the nrusing station.   DISPOSITION UPDATE   CSW was updated by the provider and clinical team the patient's discharge will placed on hold until tomorrow, and once she medically cleared, then she will transfer to the noted SNF above.   This Clinical research associate contacted the pt's daughter daughter [name]) and updated her to the above information. [name] expressed gratitude or the update and was aligned to placing a hold on the transfer to tomorrow 11/30/2023.   No other needs identified or requested of this writer at this current time. Please consult TOC if new need arises.  Final next level of care: Skilled Nursing Facility Barriers to Discharge: Continued Medical Work up   Patient Goals and CMS Choice Patient states their goals for this hospitalization and ongoing recovery are:: Return home   Choice offered to / list presented to : NA      Discharge Placement              Patient chooses bed at: [facility] Patient to be transferred to facility by: [facility] Name of family member notified: [name] Patient and family notified of of transfer: 11/29/23  Discharge Plan and Services Additional resources added to the After Visit Summary for                  DME Arranged: N/A DME Agency: NA       HH Arranged: NA HH Agency: NA        Social Drivers of Health (SDOH) Interventions SDOH Screenings   Food Insecurity: No Food Insecurity (11/28/2023)  Housing: Low Risk  (11/28/2023)  Transportation Needs: No Transportation Needs (11/28/2023)  Utilities: Not At Risk (11/28/2023)  Social Connections: Socially Isolated (11/28/2023)  Tobacco Use: Medium Risk (11/28/2023)     Readmission Risk Interventions     No data to display

## 2023-11-30 DIAGNOSIS — R278 Other lack of coordination: Secondary | ICD-10-CM | POA: Diagnosis not present

## 2023-11-30 DIAGNOSIS — E785 Hyperlipidemia, unspecified: Secondary | ICD-10-CM | POA: Diagnosis not present

## 2023-11-30 DIAGNOSIS — I471 Supraventricular tachycardia, unspecified: Secondary | ICD-10-CM | POA: Diagnosis not present

## 2023-11-30 DIAGNOSIS — M6281 Muscle weakness (generalized): Secondary | ICD-10-CM | POA: Diagnosis not present

## 2023-11-30 DIAGNOSIS — R262 Difficulty in walking, not elsewhere classified: Secondary | ICD-10-CM | POA: Diagnosis not present

## 2023-11-30 DIAGNOSIS — I2510 Atherosclerotic heart disease of native coronary artery without angina pectoris: Secondary | ICD-10-CM | POA: Diagnosis not present

## 2023-11-30 DIAGNOSIS — R2681 Unsteadiness on feet: Secondary | ICD-10-CM | POA: Diagnosis not present

## 2023-11-30 DIAGNOSIS — I10 Essential (primary) hypertension: Secondary | ICD-10-CM | POA: Diagnosis not present

## 2023-11-30 DIAGNOSIS — R55 Syncope and collapse: Secondary | ICD-10-CM | POA: Diagnosis not present

## 2023-11-30 DIAGNOSIS — J449 Chronic obstructive pulmonary disease, unspecified: Secondary | ICD-10-CM | POA: Diagnosis not present

## 2023-11-30 DIAGNOSIS — Z743 Need for continuous supervision: Secondary | ICD-10-CM | POA: Diagnosis not present

## 2023-11-30 DIAGNOSIS — E1122 Type 2 diabetes mellitus with diabetic chronic kidney disease: Secondary | ICD-10-CM | POA: Diagnosis not present

## 2023-11-30 DIAGNOSIS — R000 Tachycardia, unspecified: Secondary | ICD-10-CM | POA: Diagnosis not present

## 2023-11-30 DIAGNOSIS — L304 Erythema intertrigo: Secondary | ICD-10-CM | POA: Diagnosis not present

## 2023-11-30 DIAGNOSIS — Z7401 Bed confinement status: Secondary | ICD-10-CM | POA: Diagnosis not present

## 2023-11-30 LAB — CBC WITH DIFFERENTIAL/PLATELET
Abs Immature Granulocytes: 0.03 10*3/uL (ref 0.00–0.07)
Basophils Absolute: 0 10*3/uL (ref 0.0–0.1)
Basophils Relative: 0 %
Eosinophils Absolute: 0.1 10*3/uL (ref 0.0–0.5)
Eosinophils Relative: 1 %
HCT: 39.1 % (ref 36.0–46.0)
Hemoglobin: 12.7 g/dL (ref 12.0–15.0)
Immature Granulocytes: 0 %
Lymphocytes Relative: 31 %
Lymphs Abs: 2.5 10*3/uL (ref 0.7–4.0)
MCH: 29.3 pg (ref 26.0–34.0)
MCHC: 32.5 g/dL (ref 30.0–36.0)
MCV: 90.1 fL (ref 80.0–100.0)
Monocytes Absolute: 0.5 10*3/uL (ref 0.1–1.0)
Monocytes Relative: 6 %
Neutro Abs: 5 10*3/uL (ref 1.7–7.7)
Neutrophils Relative %: 62 %
Platelets: 278 10*3/uL (ref 150–400)
RBC: 4.34 MIL/uL (ref 3.87–5.11)
RDW: 13.4 % (ref 11.5–15.5)
WBC: 8.2 10*3/uL (ref 4.0–10.5)
nRBC: 0 % (ref 0.0–0.2)

## 2023-11-30 LAB — GLUCOSE, CAPILLARY
Glucose-Capillary: 154 mg/dL — ABNORMAL HIGH (ref 70–99)
Glucose-Capillary: 207 mg/dL — ABNORMAL HIGH (ref 70–99)

## 2023-11-30 LAB — COMPREHENSIVE METABOLIC PANEL
ALT: 28 U/L (ref 0–44)
AST: 21 U/L (ref 15–41)
Albumin: 2.8 g/dL — ABNORMAL LOW (ref 3.5–5.0)
Alkaline Phosphatase: 72 U/L (ref 38–126)
Anion gap: 8 (ref 5–15)
BUN: 11 mg/dL (ref 8–23)
CO2: 28 mmol/L (ref 22–32)
Calcium: 8.8 mg/dL — ABNORMAL LOW (ref 8.9–10.3)
Chloride: 102 mmol/L (ref 98–111)
Creatinine, Ser: 1 mg/dL (ref 0.44–1.00)
GFR, Estimated: 59 mL/min — ABNORMAL LOW (ref >60)
Glucose, Bld: 158 mg/dL — ABNORMAL HIGH (ref 70–99)
Potassium: 3.9 mmol/L (ref 3.5–5.1)
Sodium: 138 mmol/L (ref 135–145)
Total Bilirubin: 0.7 mg/dL (ref 0.0–1.2)
Total Protein: 5.7 g/dL — ABNORMAL LOW (ref 6.5–8.1)

## 2023-11-30 MED ORDER — PROMETHAZINE HCL 25 MG PO TABS: 25.0000 mg | ORAL_TABLET | Freq: Four times a day (QID) | ORAL | Status: DC | PRN

## 2023-11-30 MED ORDER — HYDROCERIN EX CREA
TOPICAL_CREAM | Freq: Two times a day (BID) | CUTANEOUS | Status: DC
  Filled 2023-11-30: qty 113

## 2023-11-30 MED ORDER — METOPROLOL TARTRATE 50 MG PO TABS: 50.0000 mg | ORAL_TABLET | Freq: Two times a day (BID) | ORAL | Status: DC

## 2023-11-30 MED ORDER — SIMETHICONE 80 MG PO CHEW
80.0000 mg | CHEWABLE_TABLET | Freq: Once | ORAL | Status: AC
  Administered 2023-11-30: 80 mg via ORAL
  Filled 2023-11-30: qty 1

## 2023-11-30 MED ORDER — ONDANSETRON HCL 4 MG PO TABS: 4.0000 mg | ORAL_TABLET | Freq: Three times a day (TID) | ORAL | Status: DC | PRN

## 2023-11-30 NOTE — NC FL2 (Signed)
 [place] MEDICAID FL2 LEVEL OF CARE FORM     IDENTIFICATION  Patient Name: Monica Thompson Birthdate: 08-23-1951 Sex: female Admission Date (Current Location): 11/27/2023  [employer] and [place] Number:  Producer, television/film/video and Address:  [facility] [place], [address] [place], [place] [address]      Provider Number: [id number]  Attending Physician Name and Address:  Jonah Blue, MD  Relative Name and Phone Number:       Current Level of Care: Hospital Recommended Level of Care: Skilled Nursing Facility Prior Approval Number:    Date Approved/Denied:   PASRR Number: [id number]  Discharge Plan: Home    Current Diagnoses: Patient Active Problem List   Diagnosis Date Noted   CKD (chronic kidney disease), stage II 11/28/2023   Peripheral neuropathy 11/28/2023   Near syncope 11/28/2023   Insulin dependent type 2 diabetes mellitus (HCC) 11/23/2023   SVT (supraventricular tachycardia) (HCC) 11/22/2023   Hyperglycemia 11/22/2023   Morbid obesity with BMI of 50.0-59.9, adult (HCC) 12/05/2014   COPD (chronic obstructive pulmonary disease) (HCC)    Hypertension    Hyperlipidemia    CAD S/P percutaneous coronary angioplasty 11/13/2014    Orientation RESPIRATION BLADDER Height & Weight     Self, Time, Situation  O2 (3L ) Continent, External catheter Weight: (!) 320 lb 1.7 oz (145.2 kg) Height:  5\' 4"  (162.6 cm)  BEHAVIORAL SYMPTOMS/MOOD NEUROLOGICAL BOWEL NUTRITION STATUS      Continent Diet (see dc summary)  AMBULATORY STATUS COMMUNICATION OF NEEDS Skin   Extensive Assist Verbally Normal                       Personal Care Assistance Level of Assistance  Bathing, Feeding, Dressing Bathing Assistance: Maximum assistance Feeding assistance: Limited assistance Dressing Assistance: Maximum assistance     Functional Limitations Info  Sight, Hearing, Speech Sight Info: Adequate Hearing Info: Adequate Speech Info: Adequate    SPECIAL  CARE FACTORS FREQUENCY  PT (By licensed PT), OT (By licensed OT)     PT Frequency: 5x week OT Frequency: 5x week            Contractures Contractures Info: Not present    Additional Factors Info  Code Status, Allergies, Insulin Sliding Scale Code Status Info: Full Allergies Info: nka   Insulin Sliding Scale Info: see dc summary       Current Medications (11/30/2023):  This is the current hospital active medication list Current Facility-Administered Medications  Medication Dose Route Frequency Provider Last Rate Last Admin   acetaminophen (TYLENOL) tablet 650 mg  650 mg Oral Q6H PRN Janalyn Shy, Subrina, MD   650 mg at 11/29/23 2151   Or   acetaminophen (TYLENOL) suppository 650 mg  650 mg Rectal Q6H PRN Tereasa Coop, MD       aspirin EC tablet 81 mg  81 mg Oral Daily Sundil, Subrina, MD   81 mg at 11/30/23 1000   atorvastatin (LIPITOR) tablet 20 mg  20 mg Oral Daily Sundil, Subrina, MD   20 mg at 11/30/23 1000   enoxaparin (LOVENOX) injection 40 mg  40 mg Subcutaneous Q24H Sundil, Subrina, MD   40 mg at 11/30/23 1000   gabapentin (NEURONTIN) capsule 100 mg  100 mg Oral QHS Sundil, Subrina, MD   100 mg at 11/29/23 2130   hydrocerin (EUCERIN) cream   Topical BID Jonah Blue, MD       insulin aspart (novoLOG) injection 0-20 Units  0-20 Units  Subcutaneous TID WC Jonah Blue, MD   7 Units at 11/30/23 1208   insulin aspart (novoLOG) injection 0-5 Units  0-5 Units Subcutaneous QHS Sundil, Subrina, MD       insulin glargine-yfgn [facility]) injection 10 Units  10 Units Subcutaneous Daily Janalyn Shy, Subrina, MD   10 Units at 11/30/23 1004   levalbuterol (XOPENEX) nebulizer solution 0.63 mg  0.63 mg Nebulization Q6H PRN Sundil, Subrina, MD       metoprolol tartrate (LOPRESSOR) tablet 50 mg  50 mg Oral BID Camnitz, Will Daphine Deutscher, MD   50 mg at 11/30/23 1000   ondansetron (ZOFRAN) tablet 4 mg  4 mg Oral Q6H PRN Janalyn Shy, Subrina, MD   4 mg at 11/29/23 1922   Or   ondansetron [facility])  injection 4 mg  4 mg Intravenous Q6H PRN Janalyn Shy, Subrina, MD   4 mg at 11/29/23 1132   pantoprazole (PROTONIX) EC tablet 40 mg  40 mg Oral Daily Sundil, Subrina, MD   40 mg at 11/30/23 1000   promethazine (PHENERGAN) tablet 25 mg  25 mg Oral Q6H PRN Jonah Blue, MD   25 mg at 11/30/23 0029   sodium chloride flush (NS) 0.9 % injection 3 mL  3 mL Intravenous Q12H Sundil, Subrina, MD   3 mL at 11/29/23 2131   sodium chloride flush (NS) 0.9 % injection 3 mL  3 mL Intravenous Q12H Sundil, Subrina, MD   3 mL at 11/30/23 1016   sodium chloride flush (NS) 0.9 % injection 3 mL  3 mL Intravenous PRN Tereasa Coop, MD         Discharge Medications: Please see discharge summary for a list of discharge medications.  Relevant Imaging Results:  Relevant Lab Results:   Additional Information SSN [id number] Verita Kuroda, LCSWA

## 2023-11-30 NOTE — Discharge Summary (Addendum)
 Physician Discharge Summary   Patient: Monica Thompson MRN: [id number] DOB: August 22, 1951  Admit date:     11/27/2023  Discharge date: 11/30/23  Discharge Physician: Jonah Blue   PCP: Kirstie Peri, MD   Recommendations at discharge:   Continue metoprolol 50 mg twice daily; hold for HR <50 or SBP <100 Follow up with cardiology on 3/19 as scheduled You are being discharged to [facility] for rehabilitation Follow up with Dr. Sherryll Burger after discharge from rehab  Discharge Diagnoses: Principal Problem:   SVT (supraventricular tachycardia) (HCC) Active Problems:   COPD (chronic obstructive pulmonary disease) (HCC)   Hyperlipidemia   Morbid obesity with BMI of 50.0-59.9, adult (HCC)   CAD S/P percutaneous coronary angioplasty   Hypertension   Insulin dependent type 2 diabetes mellitus (HCC)   Peripheral neuropathy   Near syncope    Hospital Course: 72yo with h/o CAD s/p PCI, HTN, COPD, HTN, DM, and morbid obesity who presented on 2/14 with presyncope.  She was noted to be in SVT and this improved with vagal maneuvers.  She had a recent similar admission, Echo with preserved EF and grade 1 diastolic dysfunction.  Cardiology consulted overnight and recommend Amiodarone infusion + continued Toprol XL 25 mg daily.  EP consulted.  HR was controlled on the night of admission and she developed mild hypotension and so amiodarone was held and she was given a bolus.  Cardiology has consulted and recommends increasing dose of metoprolol and outpatient f/u. No further episodes of SVT noted.  Assessment and Plan:  Supraventricular tachycardia Patient previously hospitalized from 2/9-14 with syncope associated with SVT She was started on Toprol XL and discharged to SNF rehab She returned the same of discharge with recurrent symptoms EKG showed supraventricular tachycardia with heart rate 171 Cardiology evaluated on the night of admission and recommended IV amiodarone with continued Toprol-XL. BP  decreased, HR converted, and amiodarone was stopped Echocardiogram from 11/22/2023 showed EF 55 to 60% and grade 1 diastolic heart failure TSH, troponin, BNP, Mag++ all WNL EP consulted and recommended increasing metoprolol to 50 mg BID Monitored on telemetry without recurrence Has been OOB to bathroom and this level of exertion does not precipitate recurrence With no further event (HR in the 60s since admission so far), she is stable for dc back to SNF rehab today Planned for outpatient cardiology/EP follow-up   Nausea Patient reported nausea after eating since the episode Friday at her facility Normal LFTs on presentation and again today S/p cholecystectomy Low current suspicion for serious disease based on evaluation Tolerated breakfast today without nausea Consider outpatient GI evaluation should nausea recur   Presyncopal event secondary to SVT Dizziness/presyncope secondary to in the setting of SVT Patient denies any loss of consciousness Continue fall precautions    History of CAD  S/p PCI 2016  Continue aspirin 81 mg daily Started on low-dose Lipitor during last hospitalization   Essential hypertension Metoprolol increased to 50 mg BID   Insulin-dependent DM type II A1c 10.4, very  poor control Continue Semglee 10 units Resume metformin Continue gabapentin   Peripheral neuropathy Continue gabapentin    History of COPD Stable, appears compensated   Morbid obesity Body mass index is 54.95 kg/m.  Weight loss should be encouraged Outpatient PCP/bariatric medicine f/u encouraged   Deconditioning Planned for SNF rehab during last hospitalization She was not at the facility long enough to being rehab and had to return She appears to be stable to return to the facility today for rehab PT/OT consulted  TOC team is aware        Consultants: Cardiology/EP OT PT TOC team   Procedures: None   Antibiotics: None  Pain control - [place] Controlled  Substance Reporting System database was reviewed. and patient was instructed, not to drive, operate heavy machinery, perform activities at heights, swimming or participation in water activities or provide baby-sitting services while on Pain, Sleep and Anxiety Medications; until their outpatient Physician has advised to do so again. Also recommended to not to take more than prescribed Pain, Sleep and Anxiety Medications.   Disposition: Skilled nursing facility Diet recommendation:  Carb modified diet DISCHARGE MEDICATION: Allergies as of 11/30/2023   No Known Allergies      Medication List     STOP taking these medications    metoprolol succinate 25 MG 24 hr tablet Commonly known as: Toprol XL       TAKE these medications    aspirin EC 81 MG tablet Take 81 mg by mouth daily. Swallow whole.   atorvastatin 20 MG tablet Commonly known as: LIPITOR Take 1 tablet (20 mg total) by mouth daily.   gabapentin 100 MG capsule Commonly known as: NEURONTIN Take 1 capsule (100 mg total) by mouth at bedtime.   hydrocerin Crea Apply 1 Application topically 2 (two) times daily.   insulin glargine-yfgn 100 UNIT/ML Pen Commonly known as: SEMGLEE Inject 10 Units into the skin at bedtime.   metFORMIN 500 MG tablet Commonly known as: GLUCOPHAGE Take 1 tablet (500 mg total) by mouth 2 (two) times daily with a meal.   metoprolol tartrate 50 MG tablet Commonly known as: LOPRESSOR Take 1 tablet (50 mg total) by mouth 2 (two) times daily.   ondansetron 4 MG tablet Commonly known as: ZOFRAN Take 1 tablet (4 mg total) by mouth every 8 (eight) hours as needed for nausea or vomiting.   pantoprazole 40 MG tablet Commonly known as: PROTONIX Take 40 mg by mouth daily.   promethazine 25 MG tablet Commonly known as: PHENERGAN Take 1 tablet (25 mg total) by mouth every 6 (six) hours as needed for refractory nausea / vomiting.        Follow-up Information     Ellsworth Lennox, PA-C  Follow up.   Specialties: Cardiology, Cardiology Why: [facility] - [place] location - cardiology follow-up arranged Wednesday Dec 30, 2023 3:30 PM. Arrive 15 minutes prior to appointment to check in. Contact information: [address] [place] [address] [phone]                Discharge Exam:   Subjective: No specific complaints today, other than not having cream for her feet.   Objective: Vitals:   11/30/23 0553 11/30/23 1003  BP: (!) 152/64 (!) 114/55  Pulse: 60 (!) 52  Resp:    Temp: 97.6 F (36.4 C)   SpO2: 95% (!) 88%    Intake/Output Summary (Last 24 hours) at 11/30/2023 1435 Last data filed at 11/30/2023 9147 Gross per 24 hour  Intake 360 ml  Output 250 ml  Net 110 ml   Filed Weights   11/28/23 1522  Weight: (!) 145.2 kg    Exam:  General:  Appears calm and comfortable and is in NAD Eyes:   EOMI, normal lids, iris ENT:  grossly normal hearing, lips & tongue, mmm Neck:  no LAD, masses or thyromegaly Cardiovascular:  RRR. No LE edema.  Respiratory:   CTA bilaterally with no wheezes/rales/rhonchi.  Normal respiratory effort. Abdomen:  soft, NT, ND  Skin:  no rash or induration seen on limited exam; chronic stasis dermatitis with sloughing of B feet Musculoskeletal:  grossly normal tone BUE/BLE, good ROM, no bony abnormality Psychiatric:  grossly normal mood and affect, speech fluent and appropriate, AOx3 Neurologic:  CN 2-12 grossly intact, moves all extremities in coordinated fashion  Data Reviewed: I have reviewed the patient's lab results since admission.  Pertinent labs for today include:   Glucose 158 Albumin 2.8 Normal CBC    Condition at discharge: improving  The results of significant diagnostics from this hospitalization (including imaging, microbiology, ancillary and laboratory) are listed below for reference.   Imaging Studies: DG Chest Port 1 View Result Date: 11/28/2023 CLINICAL DATA:  Tachycardia and shortness of  breath EXAM: PORTABLE CHEST 1 VIEW COMPARISON:  11/22/2023 FINDINGS: Cardiac shadow remains enlarged. Aortic calcifications are seen. Skin folds are noted over the right chest. Lungs are well aerated bilaterally. No focal infiltrate or effusion is noted. No bony abnormality is seen. IMPRESSION: No active disease. Electronically Signed   By: Alcide Clever M.D.   On: 11/28/2023 00:14   VAS [place] ABI WITH/WO TBI Result Date: 11/26/2023  LOWER EXTREMITY DOPPLER STUDY Patient Name:  Monica Thompson  Date of Exam:   11/26/2023 Medical Rec #: [id number]        Accession #:    [id number] Date of Birth: June 12, 1951        Patient Gender: F Patient Age:   83 years Exam Location:  [facility] Procedure:      VAS [place] ABI WITH/WO TBI Referring Phys: [facility] POKHREL --------------------------------------------------------------------------------  Indications: Claudication. High Risk         Hypertension, hyperlipidemia, Diabetes, coronary artery Factors:          disease.  Limitations: Today's exam was limited due to patient unable to lie flat, patient              positioning and patient intolerant to cuff pressure. Comparison Study: No prior studies. Performing Technologist: Olen Cordial RVT  Examination Guidelines: A complete evaluation includes at minimum, Doppler waveform signals and systolic blood pressure reading at the level of bilateral brachial, anterior tibial, and posterior tibial arteries, when vessel segments are accessible. Bilateral testing is considered an integral part of a complete examination. Photoelectric Plethysmograph (PPG) waveforms and toe systolic pressure readings are included as required and additional duplex testing as needed. Limited examinations for reoccurring indications may be performed as noted.  ABI Findings: +---------+------------------+-----+-----------+--------+ Right    Rt Pressure (mmHg)IndexWaveform   Comment  +---------+------------------+-----+-----------+--------+ Brachial  190                    triphasic           +---------+------------------+-----+-----------+--------+ PTA      178               0.94 multiphasic         +---------+------------------+-----+-----------+--------+ DP       171               0.90 multiphasic         +---------+------------------+-----+-----------+--------+ Great Toe109               0.57                     +---------+------------------+-----+-----------+--------+ +---------+------------------+-----+-----------+-------+ Left     Lt Pressure (mmHg)IndexWaveform   Comment +---------+------------------+-----+-----------+-------+ Brachial  triphasic          +---------+------------------+-----+-----------+-------+ PTA      161               0.85 multiphasic        +---------+------------------+-----+-----------+-------+ DP       155               0.82 multiphasic        +---------+------------------+-----+-----------+-------+ Great Toe94                0.49                    +---------+------------------+-----+-----------+-------+ +-------+-----------+-----------+------------+------------+ ABI/TBIToday's ABIToday's TBIPrevious ABIPrevious TBI +-------+-----------+-----------+------------+------------+ Right  0.94       0.57                                +-------+-----------+-----------+------------+------------+ Left   0.85       0.49                                +-------+-----------+-----------+------------+------------+   Summary: Right: Resting right ankle-brachial index indicates mild right lower extremity arterial disease. The right toe-brachial index is abnormal. Left: Resting left ankle-brachial index indicates mild left lower extremity arterial disease. The left toe-brachial index is abnormal. *See table(s) above for measurements and observations.  Electronically signed by Heath Lark on 11/26/2023 at 4:04:30 PM.    Final    ECHOCARDIOGRAM  COMPLETE Result Date: 11/22/2023    ECHOCARDIOGRAM REPORT   Patient Name:   Monica Thompson Date of Exam: 11/22/2023 Medical Rec #:  [id number]       Height:       64.0 in Accession #:    [id number]      Weight:       290.0 lb Date of Birth:  01-Sep-1951       BSA:          2.291 m Patient Age:    72 years        BP:           106/93 mmHg Patient Gender: F               HR:           70 bpm. Exam Location:  Inpatient Procedure: 2D Echo, Intracardiac Opacification Agent, Color Doppler and Cardiac            Doppler Indications:    supraventricular tachycardia (svt)  History:        Patient has prior history of Echocardiogram examinations, most                 recent 11/23/2014. CAD, COPD, Arrythmias:NSTEMI; Risk                 Factors:Hypertension, Dyslipidemia and Current Smoker.  Sonographer:    Raeford Razor RDCS Referring Phys: [phone] ERIC CHEN  Sonographer Comments: Technically difficult study due to poor echo windows, no subcostal window and patient is obese. IMPRESSIONS  1. Images are limited.  2. Left ventricular ejection fraction, by estimation, is 55 to 60%. The left ventricle has normal function. The left ventricle has no regional wall motion abnormalities. Left ventricular diastolic parameters are consistent with Grade I diastolic dysfunction (impaired relaxation).  3. Right ventricular systolic function is low normal. The right ventricular size is normal. Tricuspid regurgitation signal  is inadequate for assessing PA pressure.  4. A small pericardial effusion is present. The pericardial effusion is posterior to the left ventricle.  5. The mitral valve is grossly normal. Trivial mitral valve regurgitation.  6. The aortic valve was not well visualized. Aortic valve regurgitation is not visualized. Comparison(s): Prior images unable to be directly viewed. FINDINGS  Left Ventricle: Left ventricular ejection fraction, by estimation, is 55 to 60%. The left ventricle has normal function. The left ventricle has no regional  wall motion abnormalities. The left ventricular internal cavity size was normal in size. There is  no left ventricular hypertrophy. Left ventricular diastolic parameters are consistent with Grade I diastolic dysfunction (impaired relaxation). Right Ventricle: The right ventricular size is normal. Right vetricular wall thickness was not well visualized. Right ventricular systolic function is low normal. Tricuspid regurgitation signal is inadequate for assessing PA pressure. Left Atrium: Left atrial size was normal in size. Right Atrium: Right atrial size was normal in size. Pericardium: A small pericardial effusion is present. The pericardial effusion is posterior to the left ventricle. Presence of epicardial fat layer. Mitral Valve: The mitral valve is grossly normal. Mild mitral annular calcification. Trivial mitral valve regurgitation. Tricuspid Valve: The tricuspid valve is grossly normal. Tricuspid valve regurgitation is trivial. Aortic Valve: The aortic valve was not well visualized. Aortic valve regurgitation is not visualized. Pulmonic Valve: The pulmonic valve was grossly normal. Pulmonic valve regurgitation is trivial. Aorta: The aortic root and ascending aorta are structurally normal, with no evidence of dilitation. IAS/Shunts: The interatrial septum was not well visualized.  LEFT VENTRICLE PLAX 2D LVIDd:         4.90 cm      Diastology LVIDs:         3.10 cm      LV e' medial:    3.05 cm/s LV PW:         1.00 cm      LV E/e' medial:  20.7 LV IVS:        0.80 cm      LV e' lateral:   8.70 cm/s                             LV E/e' lateral: 7.3  LV Volumes (MOD) LV vol d, MOD A2C: 96.3 ml LV vol d, MOD A4C: 137.0 ml LV vol s, MOD A2C: 35.9 ml LV vol s, MOD A4C: 47.3 ml LV SV MOD A2C:     60.4 ml LV SV MOD A4C:     137.0 ml LV SV MOD BP:      77.2 ml RIGHT VENTRICLE RV Basal diam:  3.20 cm RV S prime:     12.60 cm/s TAPSE (M-mode): 2.0 cm LEFT ATRIUM             Index        RIGHT ATRIUM           Index LA  diam:        4.70 cm 2.05 cm/m   RA Area:     17.40 cm LA Vol (A2C):   59.5 ml 25.97 ml/m  RA Volume:   43.90 ml  19.16 ml/m LA Vol (A4C):   34.3 ml 14.97 ml/m LA Biplane Vol: 50.3 ml 21.96 ml/m  AORTIC VALVE LVOT Vmax:   88.30 cm/s  AORTA Ao Root diam: 2.90 cm Ao Asc diam:  3.20 cm MITRAL VALVE MV Area (PHT): 2.66 cm MV  Decel Time: 285 msec MV E velocity: 63.20 cm/s MV A velocity: 70.20 cm/s MV E/A ratio:  0.90 Nona Dell MD Electronically signed by Nona Dell MD Signature Date/Time: 11/22/2023/4:21:29 PM    Final    DG Chest Portable 1 View Result Date: 11/22/2023 CLINICAL DATA:  Shortness of breath. EXAM: PORTABLE CHEST 1 VIEW COMPARISON:  Chest 11/30/2014 FINDINGS: There is mild cardiomegaly. Central vessels are normal in caliber. The mediastinum is normally outlined. There is calcification of the transverse aorta. The lungs are clear. Moderate thoracic spondylosis. Compare: Unchanged. IMPRESSION: No evidence of acute chest disease. Mild cardiomegaly. Aortic atherosclerosis. Electronically Signed   By: Almira Bar M.D.   On: 11/22/2023 04:16    Microbiology: Results for orders placed or performed during the hospital encounter of 11/22/23  Resp panel by RT-PCR (RSV, Flu A&B, Covid) Anterior Nasal Swab     Status: None   Collection Time: 11/22/23  2:15 AM   Specimen: Anterior Nasal Swab  Result Value Ref Range Status   SARS Coronavirus 2 by RT PCR NEGATIVE NEGATIVE Final   Influenza A by PCR NEGATIVE NEGATIVE Final   Influenza B by PCR NEGATIVE NEGATIVE Final    Comment: (NOTE) The Xpert Xpress SARS-CoV-2/FLU/RSV plus assay is intended as an aid in the diagnosis of influenza from Nasopharyngeal swab specimens and should not be used as a sole basis for treatment. Nasal washings and aspirates are unacceptable for Xpert Xpress SARS-CoV-2/FLU/RSV testing.  Fact Sheet for Patients: BloggerCourse.com  Fact Sheet for Healthcare  Providers: SeriousBroker.it  This test is not yet approved or cleared by the [place] FDA and has been authorized for detection and/or diagnosis of SARS-CoV-2 by FDA under an Emergency Use Authorization (EUA). This EUA will remain in effect (meaning this test can be used) for the duration of the COVID-19 declaration under Section 564(b)(1) of the Act, 21 U.S.C. section 360bbb-3(b)(1), unless the authorization is terminated or revoked.     Resp Syncytial Virus by PCR NEGATIVE NEGATIVE Final    Comment: (NOTE) Fact Sheet for Patients: BloggerCourse.com  Fact Sheet for Healthcare Providers: SeriousBroker.it  This test is not yet approved or cleared by the [place] FDA and has been authorized for detection and/or diagnosis of SARS-CoV-2 by FDA under an Emergency Use Authorization (EUA). This EUA will remain in effect (meaning this test can be used) for the duration of the COVID-19 declaration under Section 564(b)(1) of the Act, 21 U.S.C. section 360bbb-3(b)(1), unless the authorization is terminated or revoked.  Performed at [facility], [address], [place], [place] [address]     Labs: CBC: Recent Labs  Lab 11/25/23 0255 11/27/23 2348 11/28/23 0442 11/29/23 0819 11/30/23 0416  WBC 6.4 8.8 7.3 6.6 8.2  NEUTROABS  --   --   --  3.5 5.0  HGB 13.1 14.1 14.3 13.1 12.7  HCT 40.5 43.1 46.8* 41.0 39.1  MCV 88.6 89.4 95.5 89.5 90.1  PLT 290 314 246 182 278   Basic Metabolic Panel: Recent Labs  Lab 11/24/23 0229 11/25/23 0255 11/26/23 0303 11/27/23 2348 11/28/23 0442 11/29/23 0819 11/30/23 0416  NA 136   < > 135 139 137 139 138  K 4.8   < > 4.3 4.4 4.5 4.2 3.9  CL 101   < > 100 102 105 104 102  CO2 28   < > 25 23 18* 24 28  GLUCOSE 300*   < > 243* 238* 234* 170* 158*  BUN 24*   < >  22 18 17 12 11   CREATININE 0.96   < > 1.04* 0.85 0.84 0.82 1.00  CALCIUM 8.7*   < > 8.8*  9.0 8.4* 8.5* 8.8*  MG 2.0  --   --  1.9  --   --   --    < > = values in this interval not displayed.   Liver Function Tests: Recent Labs  Lab 11/28/23 0442 11/30/23 0416  AST 17 21  ALT 26 28  ALKPHOS 71 72  BILITOT 0.7 0.7  PROT 5.6* 5.7*  ALBUMIN 2.6* 2.8*   CBG: Recent Labs  Lab 11/29/23 1206 11/29/23 1650 11/29/23 2150 11/30/23 0749 11/30/23 1203  GLUCAP 172* 173* 154* 154* 207*    Discharge time spent: greater than 30 minutes.  Signed: Jonah Blue, MD [facility] 11/30/2023

## 2023-11-30 NOTE — TOC Progression Note (Addendum)
 Transition of Care [facility]) - Progression Note    Patient Details  Name: Monica Thompson MRN: [id number] Date of Birth: 1951-06-29  Transition of Care [facility]) CM/SW Contact  Delilah Shan, LCSWA Phone Number: 11/30/2023, 12:39 PM  Clinical Narrative:     Patient is from [facility]. Insurance authorization started for SNF. CSW will continue to follow.  Patients insurance authorization has been approved her Certification number is [id number] .     Barriers to Discharge: Continued Medical Work up  Expected Discharge Plan and Services         Expected Discharge Date: 11/30/23               DME Arranged: N/A DME Agency: NA       HH Arranged: NA HH Agency: NA         Social Determinants of Health (SDOH) Interventions SDOH Screenings   Food Insecurity: No Food Insecurity (11/28/2023)  Housing: Low Risk  (11/28/2023)  Transportation Needs: No Transportation Needs (11/28/2023)  Utilities: Not At Risk (11/28/2023)  Social Connections: Socially Isolated (11/29/2023)  Tobacco Use: Medium Risk (11/28/2023)    Readmission Risk Interventions     No data to display

## 2023-11-30 NOTE — Evaluation (Signed)
 Physical Therapy Evaluation Patient Details Name: Monica Thompson MRN: [id number] DOB: Sep 26, 1951 Today's Date: 11/30/2023  History of Present Illness  73yo  who presented on 2/14 with presyncope.  She was noted to be in SVT and this improved with vagal maneuvers.   Echo with preserved EF and grade 1 diastolic dysfunction.  Cardiology consulted treated medically and recommends increasing dose of metoprolol and outpatient f/u. No further episodes of SVT noted. PMH: CAD s/p PCI, HTN, COPD, HTN, DM, and morbid obesity  Clinical Impression  Pt admitted with above diagnosis. Pt was able to stand to RW with min assist. Pt was not orthostatic today with BP in flowsheet.  VSS with mobility to EOB. Pt reports she didn't feel she could walk today.  Should be ok to d/c to post acute REhab at d/c for therapy < 3 hours day once medically stable.  Pt currently with functional limitations due to the deficits listed below (see PT Problem List). Pt will benefit from acute skilled PT to increase their independence and safety with mobility to allow discharge.           If plan is discharge home, recommend the following: Two people to help with walking and/or transfers;A lot of help with bathing/dressing/bathroom;Assistance with cooking/housework;Direct supervision/assist for medications management;Assist for transportation;Help with stairs or ramp for entrance   Can travel by private vehicle   Yes    Equipment Recommendations Rolling [place] (2 wheels);Wheelchair (measurements PT);Wheelchair cushion (measurements PT);Hospital bed;BSC/3in1 (Bariatric equipment recommended)  Recommendations for Other Services       Functional Status Assessment Patient has had a recent decline in their functional status and demonstrates the ability to make significant improvements in function in a reasonable and predictable amount of time.     Precautions / Restrictions Precautions Precautions: Fall Restrictions Weight Bearing  Restrictions Per Provider Order: No      Mobility  Bed Mobility Overal bed mobility: Needs Assistance Bed Mobility: Supine to Sit     Supine to sit: HOB elevated, Used rails, Min assist Sit to supine: Min assist   General bed mobility comments: Pt required minA to bring BLE off bed, minA at trunk to achieve upright posture, and she scooted to EOB with BUE support.  Needed a little assist to get LEs in bed and to get scooted up in bed.    Transfers Overall transfer level: Needs assistance Equipment used: Rolling [place] (2 wheels) Transfers: Sit to/from Stand Sit to Stand: Min assist           General transfer comment: Cued pt on hand placement and increased fwd lean prior to powering up. Good eccentric control observed with sitting.    Ambulation/Gait                  Stairs            Wheelchair Mobility     Tilt Bed    Modified Rankin (Stroke Patients Only)       Balance Overall balance assessment: Needs assistance Sitting-balance support: Bilateral upper extremity supported, Feet supported Sitting balance-Leahy Scale: Fair Sitting balance - Comments: Pt sat EOB with supervision.   Standing balance support: Bilateral upper extremity supported, Reliant on assistive device for balance, During functional activity Standing balance-Leahy Scale: Poor Standing balance comment: Pt relies on RW when standing and quickly fatigues. MinA for stability.  Pertinent Vitals/Pain Pain Assessment Pain Assessment: Faces Faces Pain Scale: Hurts little more Pain Location: B hips and back Pain Descriptors / Indicators: Aching, Discomfort Pain Intervention(s): Limited activity within patient's tolerance, Monitored during session, Repositioned    Home Living Family/patient expects to be discharged to:: Skilled nursing facility Living Arrangements: Children;Other relatives (daughter, son in law, granddaughter) Available Help  at Discharge: Family;Available 24 hours/day Type of Home: House Home Access: Stairs to enter Entrance Stairs-Rails: Doctor, general practice of Steps: 6   Home Layout: One level Home Equipment: Cane - quad;Cane - single point;Toilet riser;Wheelchair - manual Additional Comments: [facility] PTA for REhab and had just gotten there 2/14    Prior Function Prior Level of Function : Needs assist  Cognitive Assist : Mobility (cognitive)           Mobility Comments: SPC/quad cane for household mobility, at times reports rentng a w/c ADLs Comments: sponge bathes at baseline, assist as needed for ADLs, manages own meds, does not drive -- reports not leaving her home much     Extremity/Trunk Assessment   Upper Extremity Assessment Upper Extremity Assessment: Defer to OT evaluation    Lower Extremity Assessment Lower Extremity Assessment: Generalized weakness    Cervical / Trunk Assessment Cervical / Trunk Assessment: Other exceptions (body habitus)  Communication   Communication Communication: No apparent difficulties    Cognition Arousal: Alert Behavior During Therapy: WFL for tasks assessed/performed   PT - Cognitive impairments: No apparent impairments                         Following commands: Intact       Cueing Cueing Techniques: Verbal cues, Tactile cues     General Comments General comments (skin integrity, edema, etc.): 54 bpm, 94% RA intially. See flowsheet for orthostatic BPs    Exercises General Exercises - Lower Extremity Ankle Circles/Pumps: AROM, Both, 10 reps, Seated Long Arc Quad: AROM, Both, 10 reps, Seated   Assessment/Plan    PT Assessment Patient needs continued PT services  PT Problem List Decreased strength;Decreased range of motion;Decreased activity tolerance;Decreased balance;Decreased mobility;Decreased cognition;Decreased coordination;Decreased knowledge of use of DME;Decreased knowledge of precautions;Decreased safety  awareness;Cardiopulmonary status limiting activity       PT Treatment Interventions DME instruction;Gait training;Stair training;Therapeutic activities;Functional mobility training;Therapeutic exercise;Balance training;Neuromuscular re-education;Cognitive remediation;Patient/family education    PT Goals (Current goals can be found in the Care Plan section)  Acute Rehab PT Goals Patient Stated Goal: Move easier PT Goal Formulation: With patient Time For Goal Achievement: 12/14/23 Potential to Achieve Goals: Fair    Frequency Min 1X/week     Co-evaluation               AM-PAC PT "6 Clicks" Mobility  Outcome Measure Help needed turning from your back to your side while in a flat bed without using bedrails?: A Little Help needed moving from lying on your back to sitting on the side of a flat bed without using bedrails?: A Little Help needed moving to and from a bed to a chair (including a wheelchair)?: A Little Help needed standing up from a chair using your arms (e.g., wheelchair or bedside chair)?: A Little Help needed to walk in hospital room?: Total Help needed climbing 3-5 steps with a railing? : Total 6 Click Score: 14    End of Session Equipment Utilized During Treatment: Gait belt Activity Tolerance: Patient limited by fatigue Patient left: with call bell/phone within reach;in bed;with bed alarm set  Nurse Communication: Mobility status PT Visit Diagnosis: Other abnormalities of gait and mobility (R26.89)    Time: 8295-6213 PT Time Calculation (min) (ACUTE ONLY): 28 min   Charges:   PT Evaluation $PT Eval Moderate Complexity: 1 Mod PT Treatments $Therapeutic Activity: 8-22 mins PT General Charges $$ ACUTE PT VISIT: 1 Visit         Jasman Pfeifle M,PT Acute Rehab Services [phone]   Bevelyn Buckles 11/30/2023, 11:17 AM

## 2023-11-30 NOTE — TOC Transition Note (Signed)
 Transition of Care [facility]) - Discharge Note   Patient Details  Name: Monica Thompson MRN: [id number] Date of Birth: [birth year]  [facility] CM/SW Contact:  Delilah Shan, LCSWA Phone Number: 11/30/2023, 1:31 PM   Clinical Narrative:     Patient will DC to: [facility]  Anticipated DC date: SNF   Family notified: Probation officer by: Sharin Mons  ?  Per MD patient ready for DC to [facility] . RN, patient, patient's family, and facility notified of DC. Discharge Summary sent to facility. RN given number for report [phone] RM# [phone]. DC packet on chart. Ambulance transport requested for patient.  CSW signing off.   Final next level of care: Skilled Nursing Facility Barriers to Discharge: No Barriers Identified   Patient Goals and CMS Choice Patient states their goals for this hospitalization and ongoing recovery are:: SNF   Choice offered to / list presented to : Adult Children      Discharge Placement              Patient chooses bed at: [facility] Patient to be transferred to facility by: [facility] Name of family member notified: [name] Patient and family notified of of transfer: 11/30/23  Discharge Plan and Services Additional resources added to the After Visit Summary for                  DME Arranged: N/A DME Agency: NA       HH Arranged: NA HH Agency: NA        Social Drivers of Health (SDOH) Interventions SDOH Screenings   Food Insecurity: No Food Insecurity (11/28/2023)  Housing: Low Risk  (11/28/2023)  Transportation Needs: No Transportation Needs (11/28/2023)  Utilities: Not At Risk (11/28/2023)  Social Connections: Socially Isolated (11/29/2023)  Tobacco Use: Medium Risk (11/28/2023)     Readmission Risk Interventions     No data to display

## 2023-11-30 NOTE — Plan of Care (Signed)
   Problem: Education: Goal: Ability to describe self-care measures that may prevent or decrease complications (Diabetes Survival Skills Education) will improve Outcome: Progressing

## 2023-11-30 NOTE — Progress Notes (Signed)
 Report called to [facility], LPN at [facility]

## 2023-11-30 NOTE — Progress Notes (Signed)
 Attempted to call report x2, voicemail left with callback number

## 2023-11-30 NOTE — Evaluation (Signed)
 Occupational Therapy Evaluation Patient Details Name: Monica Thompson MRN: [id number] DOB: 05/18/1951 Today's Date: 11/30/2023   History of Present Illness   73yo  who presented on 2/14 with presyncope.  She was noted to be in SVT and this improved with vagal maneuvers.   Echo with preserved EF and grade 1 diastolic dysfunction.  Cardiology consulted treated medically and recommends increasing dose of metoprolol and outpatient f/u. No further episodes of SVT noted. PMH: CAD s/p PCI, HTN, COPD, HTN, DM, and morbid obesity     Clinical Impressions Pt reports getting OOB with assist at SNF, had assist for ADLs, pt currently needing up to max A for ADLs, min A for bed mobility and min A for transfers with RW. Pt able to take few side steps to L toward HOB. Pt presenting with impairments listed below, will follow acutely. Patient will benefit from continued inpatient follow up therapy, <3 hours/day to maximize safety/ind with ADL/functional mobility.      If plan is discharge home, recommend the following:   A lot of help with walking and/or transfers;A lot of help with bathing/dressing/bathroom;Assistance with cooking/housework;Assist for transportation;Help with stairs or ramp for entrance     Functional Status Assessment   Patient has had a recent decline in their functional status and demonstrates the ability to make significant improvements in function in a reasonable and predictable amount of time.     Equipment Recommendations   Other (comment) (RW)     Recommendations for Other Services   PT consult     Precautions/Restrictions   Precautions Precautions: Fall Restrictions Weight Bearing Restrictions Per Provider Order: No     Mobility Bed Mobility Overal bed mobility: Needs Assistance Bed Mobility: Supine to Sit, Sit to Supine     Supine to sit: HOB elevated, Used rails, Min assist Sit to supine: Min assist        Transfers Overall transfer level:  Needs assistance Equipment used: Rolling [place] (2 wheels) Transfers: Sit to/from Stand Sit to Stand: Min assist                  Balance Overall balance assessment: Needs assistance Sitting-balance support: Bilateral upper extremity supported, Feet supported Sitting balance-Leahy Scale: Fair     Standing balance support: Bilateral upper extremity supported, Reliant on assistive device for balance, During functional activity Standing balance-Leahy Scale: Poor Standing balance comment: Pt relies on RW when standing and quickly fatigues. MinA for stability.                           ADL either performed or assessed with clinical judgement   ADL Overall ADL's : Needs assistance/impaired Eating/Feeding: Set up;Sitting   Grooming: Set up;Sitting   Upper Body Bathing: Moderate assistance;Sitting   Lower Body Bathing: Maximal assistance;Sitting/lateral leans   Upper Body Dressing : Moderate assistance;Sitting   Lower Body Dressing: Maximal assistance;Sitting/lateral leans   Toilet Transfer: Minimal assistance;Rolling [place] (2 wheels)   Toileting- Clothing Manipulation and Hygiene: Maximal assistance       Functional mobility during ADLs: Minimal assistance;Rolling [place] (2 wheels)       Vision   Vision Assessment?: Wears glasses for reading     Perception Perception: Not tested       Praxis Praxis: Not tested       Pertinent Vitals/Pain Pain Assessment Pain Assessment: Faces Pain Score: 4  Faces Pain Scale: Hurts little more Pain Location: B hips and back Pain Descriptors /  Indicators: Aching, Discomfort Pain Intervention(s): Limited activity within patient's tolerance, Monitored during session, Repositioned     Extremity/Trunk Assessment Upper Extremity Assessment Upper Extremity Assessment: Generalized weakness   Lower Extremity Assessment Lower Extremity Assessment: Defer to PT evaluation   Cervical / Trunk Assessment Cervical /  Trunk Assessment: Other exceptions (body habitus)   Communication Communication Communication: No apparent difficulties   Cognition Arousal: Alert Behavior During Therapy: WFL for tasks assessed/performed Cognition: No apparent impairments                               Following commands: Intact       Cueing  General Comments   Cueing Techniques: Verbal cues;Tactile cues      Exercises     Shoulder Instructions      Home Living Family/patient expects to be discharged to:: Skilled nursing facility Living Arrangements: Children;Other relatives Available Help at Discharge: Family;Available 24 hours/day Type of Home: House Home Access: Stairs to enter [employer] of Steps: 6 Entrance Stairs-Rails: Right;Left Home Layout: One level     Bathroom Shower/Tub: Sponge bathes at baseline;Tub/shower unit   Bathroom Toilet: Standard Bathroom Accessibility: No   Home Equipment: Cane - quad;Cane - single point;Toilet riser;Wheelchair - manual   Additional Comments: [facility] PTA for REhab and had just gotten there 2/14      Prior Functioning/Environment Prior Level of Function : Needs assist             Mobility Comments: transferred to [facility] with assist ADLs Comments: sponge bathes at baseline, assist as needed for ADLs, manages own meds, does not drive -- reports not leaving her home much    OT Problem List: Decreased strength;Decreased range of motion;Decreased activity tolerance;Impaired balance (sitting and/or standing);Obesity   OT Treatment/Interventions: Self-care/ADL training;Therapeutic exercise;Energy conservation;DME and/or AE instruction;Therapeutic activities;Patient/family education;Balance training      OT Goals(Current goals can be found in the care plan section)   Acute Rehab OT Goals Patient Stated Goal: none stated OT Goal Formulation: With patient Time For Goal Achievement: 12/14/23 Potential to Achieve Goals:  Good ADL Goals Pt Will Perform Grooming: with supervision;sitting Pt Will Perform Upper Body Dressing: with contact guard assist;sitting Pt Will Perform Lower Body Dressing: with min assist;with adaptive equipment;sitting/lateral leans;sit to/from stand;bed level Pt Will Transfer to Toilet: with supervision;squat pivot transfer;stand pivot transfer;bedside commode   OT Frequency:  Min 1X/week    Co-evaluation              AM-PAC OT "6 Clicks" Daily Activity     Outcome Measure Help from another person eating meals?: A Little Help from another person taking care of personal grooming?: A Little Help from another person toileting, which includes using toliet, bedpan, or urinal?: A Lot Help from another person bathing (including washing, rinsing, drying)?: A Lot Help from another person to put on and taking off regular upper body clothing?: A Lot Help from another person to put on and taking off regular lower body clothing?: A Lot 6 Click Score: 14   End of Session Equipment Utilized During Treatment: Gait belt;Rolling [place] (2 wheels) Nurse Communication: Mobility status  Activity Tolerance: Patient tolerated treatment well Patient left: in bed;with call bell/phone within reach;with bed alarm set  OT Visit Diagnosis: Unsteadiness on feet (R26.81);Other abnormalities of gait and mobility (R26.89);Muscle weakness (generalized) (M62.81)                Time: 1610-9604 OT Time Calculation (  min): 20 min Charges:  OT General Charges $OT Visit: 1 Visit OT Evaluation $OT Eval Moderate Complexity: 1 Mod  Eduardo Wurth K, OTD, OTR/L SecureChat Preferred Acute Rehab [phone]   Josearmando Kuhnert K Koonce 11/30/2023, 2:12 PM

## 2023-12-01 NOTE — Consult Note (Signed)
 [facility] Liaison Consult Note   12/01/2023  Monica Thompson [birth year] [id number]  [facility] [[facility]] Consult:  Readmission in less than 7 days   Primary Care Provider:  Kirstie Peri, MD, with [facility]   Insurance: [facility]   Patient was reviewed for less than 7 days readmission medium risk score for barriers to care in the community..  Patient was screened for hospitalization and on behalf of  [facility]  Care Coordination to assess for post hospital community care needs.  Patient is for [facility].SNF for rehab.  Plan:  This is an affiliated facility, and  will notify the [employer] [facility] RN can follow for any known or needs for transitional care needs for returning to post facility care coordination needs to return to community.  For questions or referrals, please contact:  Charlesetta Shanks, RN, BSN, CCM [place]  [facility], [place] [facility] Liaison Direct Dial: [phone] or secure chat Email: [email or web address]

## 2023-12-02 ENCOUNTER — Other Ambulatory Visit: Payer: Self-pay | Admitting: *Deleted

## 2023-12-02 NOTE — Patient Outreach (Signed)
 Ms. Amaral recently admitted to [facility].   Screening for potential complex care management services as benefit of health plan and primary care provider.  Made aware by [facility] RN hospital liaison that Ms. Cothron had recent 7 day hospital readmission. Will likely benefit for complex care management services.   Secure communication sent to Deseree, SNF social worker to make aware writer will follow for transition plans and potential care management needs.   [name], MSN, RN, BSN [place]  [facility], Healthy Commun[facility] RN Post- Acute Care Manager Direct Dial: [phone]

## 2023-12-03 DIAGNOSIS — E1122 Type 2 diabetes mellitus with diabetic chronic kidney disease: Secondary | ICD-10-CM | POA: Diagnosis not present

## 2023-12-03 DIAGNOSIS — I10 Essential (primary) hypertension: Secondary | ICD-10-CM | POA: Diagnosis not present

## 2023-12-03 DIAGNOSIS — L304 Erythema intertrigo: Secondary | ICD-10-CM | POA: Diagnosis not present

## 2023-12-03 DIAGNOSIS — E785 Hyperlipidemia, unspecified: Secondary | ICD-10-CM | POA: Diagnosis not present

## 2023-12-14 ENCOUNTER — Other Ambulatory Visit: Payer: Self-pay | Admitting: *Deleted

## 2023-12-14 NOTE — Patient Outreach (Signed)
 Post- Acute Care Manager follow up. Ms. Colley resides in [facility]. Screening for potential complex care management services as benefit of health plan and primary care provider.  Collaborative meeting with Deseree, SNF social worker. Ms. Cisar is from home with daughter. Plans to return home with daughter upon SNF discharge. Discharge date not set yet.  Will plan to visit Ms. Kueker at bedside to discuss complex care management follow up.    [name], MSN, RN, BSN [place]  [facility], Healthy Communities RN Post- Acute Care Manager Direct Dial: [phone]

## 2023-12-17 ENCOUNTER — Other Ambulatory Visit: Payer: Self-pay | Admitting: *Deleted

## 2023-12-17 NOTE — Patient Outreach (Signed)
 Post-Acute Care Manager follow up. Ms. Monica Thompson resides in [facility].  Screening for potential complex care management services as benefit of health plan and primary care provider.  Met with Monica Thompson at bedside at [facility]. Monica Thompson reports she was told that she was going home on tomorrow, Friday. Writer explained complex care management follow up. Monica Thompson advises writer to contact her daughter Monica Thompson at [phone] to discuss further. Monica Thompson reports living with daughter and daughter's family.   Telephone call made to daughter Monica Thompson per Monica Thompson's request at [phone]. Patient identifiers confirmed. Monica Thompson states Monica Thompson is likely to discharge on either Sunday or Monday. Monica Thompson reports PCP appointment is scheduled for Monday. Writer inquired about cardiology follow up. Monica Thompson unaware of cardiology appointment. Writer contacted cardiology office [phone], with Monica Thompson on the line, to confirm upcoming appointment and address. Confirmed with Monica Thompson on the line, Monica Thompson cardiology appointment is scheduled for March 12th at 1005 am with [name], NP at [facility] [address]. Monica Thompson expresses appreciation of the call.   Explained VBCI complex care management. Monica Thompson is agreeable. States she is trying to keep up with both her mother's appointments and health as well as her sister's. Monica Thompson expresses appreciation of complex care management follow up. Confirmed Monica Thompson's telephone number as [phone]. Monica Thompson number is [phone].   Monica Thompson has had medication changes and could potentially benefit for pharmacy referral if appropriate. Monica Thompson has medical history of CAD s/p PCI, HTN, COPD, HTN, DM, and morbid obesity, SVT.  Will refer to [facility] complex care management team upon SNF discharge.   Writer sent secure communication to [name] social worker to inquire about home health arrangements.    [name], MSN, RN, BSN [place]  [facility], Healthy Communities RN Post- Acute Care Manager Direct Dial: [phone]

## 2023-12-21 ENCOUNTER — Other Ambulatory Visit: Payer: Self-pay | Admitting: *Deleted

## 2023-12-21 DIAGNOSIS — J449 Chronic obstructive pulmonary disease, unspecified: Secondary | ICD-10-CM | POA: Diagnosis not present

## 2023-12-21 DIAGNOSIS — R262 Difficulty in walking, not elsewhere classified: Secondary | ICD-10-CM | POA: Diagnosis not present

## 2023-12-21 DIAGNOSIS — E1122 Type 2 diabetes mellitus with diabetic chronic kidney disease: Secondary | ICD-10-CM | POA: Diagnosis not present

## 2023-12-21 DIAGNOSIS — I471 Supraventricular tachycardia, unspecified: Secondary | ICD-10-CM | POA: Diagnosis not present

## 2023-12-21 DIAGNOSIS — I10 Essential (primary) hypertension: Secondary | ICD-10-CM | POA: Diagnosis not present

## 2023-12-21 NOTE — Patient Outreach (Signed)
 Post- Acute Care Manager follow up. Per [facility] Ms. Auth resides in [facility].   Secure message sent to Deseree, [facility] social worker to inquire about transition date and home health arrangements.   Will plan to refer to [facility] complex care management team upon SNF discharge.   [name], MSN, RN, BSN [place]  [facility], Healthy Communities RN Post- Acute Care Manager Direct Dial: [phone]

## 2023-12-22 ENCOUNTER — Other Ambulatory Visit: Payer: Self-pay | Admitting: *Deleted

## 2023-12-22 DIAGNOSIS — I10 Essential (primary) hypertension: Secondary | ICD-10-CM

## 2023-12-22 NOTE — Progress Notes (Addendum)
 Cardiology Office Note    Patient Name: Monica Thompson Date of Encounter: 12/22/2023  Primary Care Provider:  Kirstie Peri, MD Primary Cardiologist:  Prentice Docker, MD (Inactive) Primary Electrophysiologist: None   Past Medical History    Past Medical History:  Diagnosis Date   CAD S/P percutaneous coronary angioplasty 11/13/2014   a. 11/2014 NSTEMI/PCI: LM nl, LAD 80p (3.5x18 Resolute DES), LCX 40p, 80m (3.5x15 Resolute DES), RCA 2m, EF 50%.   Cholecystitis, acute with cholelithiasis 09/10/2013   COPD (chronic obstructive pulmonary disease) (HCC)    Essential hypertension    Hyperlipidemia    Non-ST elevation (NSTEMI) myocardial infarction (HCC) 11/13/2014   NSTEMI (non-ST elevated myocardial infarction) (HCC) 11/30/2014   NSTEMI, initial episode of care (HCC) 11/30/2014   PONV (postoperative nausea and vomiting)    Tobacco abuse     History of Present Illness  Monica Thompson is a 73 y.o. female with a PMH of CAD s/p NSTEMI 2016 PCI/DES to left circumflex and staged PCI to LAD, HTN, HLD, tobacco abuse, COPD, IDDM type II, obesity, SVT who presents today for posthospital follow-up.  Monica Thompson has a past medical history significant for CAD with NSTEMI in 2016 and PCI to left circumflex and staged PCI to LAD.  She was lost to follow-up and last seen in 2010 until most recently being admitted on 11/22/2023 with shortness of breath and found to be in SVT and improved with vagal maneuvers.  She underwent 2D echo that showed EF of 55 to 60% with grade 1 DD.  She was discharged to skilled nursing facility but readmitted on 11/28/2023 with presyncope and SVT.  She received IV amiodarone but developed hypotension and this was discontinued.  She also endorsed waves of shortness of breath and nausea.  She was evaluated by EP and started on metoprolol 50 mg/twice daily and was discharged back to SNF with home dose of metoprolol.  Monica Thompson presents today for posthospital follow-up with her  daughter.  She reports increased fatigue and tiredness since her hospital discharge.  She is currently on metoprolol 50mg  twice a day for SVT, which she believes may be causing these symptoms. The patient reports no fatigue or sleepiness, but does note frequent leg cramps and swelling. The patient's family member provides additional context, noting that the patient's heart rate had previously spiked to 190, then dropped to 85, and then jumped to 160. This led to an increase in the metoprolol dosage. The patient also has a history of stents placement for heart disease and is on atorvastatin for high cholesterol and aspirin for heart health. She also notes that she is on a low-salt diet and has cut out caffeine. Patient denies chest pain, palpitations, dyspnea, PND, orthopnea, nausea, vomiting, dizziness, syncope, edema, weight gain, or early satiety.   Review of Systems  Please see the history of present illness.    All other systems reviewed and are otherwise negative except as noted above.  Physical Exam    Wt Readings from Last 3 Encounters:  11/28/23 (!) 320 lb 1.7 oz (145.2 kg)  11/27/23 (!) 318 lb 2 oz (144.3 kg)  02/03/19 290 lb (131.5 kg)   ZO:XWRUE were no vitals filed for this visit.,There is no height or weight on file to calculate BMI. GEN: Well nourished, well developed in no acute distress Neck: No JVD; No carotid bruits Pulmonary: Clear to auscultation without rales, wheezing or rhonchi  Cardiovascular: Normal rate. Regular rhythm. Normal S1. Normal S2.   Murmurs:  There is no murmur.  ABDOMEN: Soft, non-tender, non-distended EXTREMITIES:  No edema; No deformity   EKG/LABS/ Recent Cardiac Studies   ECG personally reviewed by me today -none completed today  Risk Assessment/Calculations:          Lab Results  Component Value Date   WBC 8.2 11/30/2023   HGB 12.7 11/30/2023   HCT 39.1 11/30/2023   MCV 90.1 11/30/2023   PLT 278 11/30/2023   Lab Results  Component Value  Date   CREATININE 1.00 11/30/2023   BUN 11 11/30/2023   NA 138 11/30/2023   K 3.9 11/30/2023   CL 102 11/30/2023   CO2 28 11/30/2023   Lab Results  Component Value Date   CHOL 239 (H) 11/25/2023   HDL 37 (L) 11/25/2023   LDLCALC UNABLE TO CALCULATE IF TRIGLYCERIDE OVER 400 mg/dL 16/07/9603   LDLDIRECT 162 (H) 11/25/2023   TRIG 434 (H) 11/25/2023   CHOLHDL 6.5 11/25/2023    Lab Results  Component Value Date   HGBA1C 10.4 (H) 11/22/2023   Assessment & Plan    1.  History of CAD: -s/p NSTEMI 2016 PCI/DES to left circumflex and staged PCI to LAD -Today patient reports no chest pain or angina since previous follow-up. -Continue current GDMT with ASA 81 mg, Lipitor 20 mg grams twice daily  2.  History of SVT: - Reduce metoprolol to 25 mg twice daily due to bradycardia and hypotension -Patient will wear a 14-day ZIO for evaluation of breakthrough SVT.Marland Kitchen - Consider electrophysiologist referral if episodes persist after medication adjustment. - Advise avoiding caffeine and other tachycardia triggers.  3.  Primary hypertension: -Patient's blood pressure today was stable at 108/66 -Continue metoprolol tartrate 25 mg twice daily -Patient was prescribed a larger BP cuff for more accuracy - Instructed to monitor blood pressure at home for one week.  4.  Hyperlipidemia: -Patient's last LDL cholesterol was elevated at 162 -Will recheck LFT and lipids in 6 weeks with plan to refer to lipid clinic if cholesterol still elevated -Continue Lipitor 20 mg daily  5.   insulin-dependent DM type II: -Patient's last hemoglobin A1c was 10.4 -Continue current treatment plan per PCP  6.  History of COPD: -Patient reports shortness of breath with history of tobacco abuse. -Currently followed by pulmonology  Disposition: Follow-up with New Cardiologist or APP in 6 months    Signed, Napoleon Form, Leodis Rains, NP 12/22/2023, 10:09 AM [facility]

## 2023-12-22 NOTE — Patient Outreach (Signed)
 Post-Acute Care Manager follow up. Writer spoke with daughter Monica Thompson who previously who agreed to [facility] follow up. See writer's notes from 12/17/23.  Confirmed with Monica Thompson, [facility] Child psychotherapist. Monica Thompson returned home with daughter. Will have [facility] home health.  Monica Thompson has medical history of CAD s/p PCI, HTN, COPD, HTN, DM, and morbid obesity.  Referral made to [facility] complex care management team.   Monica Thompson daughter- [phone]  Monica Thompson-[phone]    [name], MSN, RN, BSN [place]  [facility], [facility] RN Post- Acute Care Manager Direct Dial: [phone]

## 2023-12-23 ENCOUNTER — Other Ambulatory Visit: Payer: Self-pay

## 2023-12-23 ENCOUNTER — Telehealth: Payer: Self-pay | Admitting: *Deleted

## 2023-12-23 ENCOUNTER — Other Ambulatory Visit: Payer: Self-pay | Admitting: Cardiology

## 2023-12-23 ENCOUNTER — Ambulatory Visit (INDEPENDENT_AMBULATORY_CARE_PROVIDER_SITE_OTHER)

## 2023-12-23 ENCOUNTER — Encounter: Payer: Self-pay | Admitting: Nurse Practitioner

## 2023-12-23 ENCOUNTER — Encounter: Payer: Self-pay | Admitting: *Deleted

## 2023-12-23 ENCOUNTER — Ambulatory Visit: Payer: Medicare HMO | Attending: Nurse Practitioner | Admitting: Nurse Practitioner

## 2023-12-23 VITALS — BP 108/66 | HR 50 | Ht 64.0 in | Wt 315.4 lb

## 2023-12-23 DIAGNOSIS — E119 Type 2 diabetes mellitus without complications: Secondary | ICD-10-CM

## 2023-12-23 DIAGNOSIS — I471 Supraventricular tachycardia, unspecified: Secondary | ICD-10-CM

## 2023-12-23 DIAGNOSIS — E785 Hyperlipidemia, unspecified: Secondary | ICD-10-CM

## 2023-12-23 DIAGNOSIS — I10 Essential (primary) hypertension: Secondary | ICD-10-CM

## 2023-12-23 DIAGNOSIS — I2510 Atherosclerotic heart disease of native coronary artery without angina pectoris: Secondary | ICD-10-CM

## 2023-12-23 DIAGNOSIS — Z794 Long term (current) use of insulin: Secondary | ICD-10-CM

## 2023-12-23 MED ORDER — METOPROLOL TARTRATE 25 MG PO TABS: 25.0000 mg | ORAL_TABLET | Freq: Two times a day (BID) | ORAL | 5 refills | Status: DC

## 2023-12-23 MED ORDER — BLOOD PRESSURE CUFF MISC: 1.0000 | Freq: Every day | 0 refills | Status: AC

## 2023-12-23 NOTE — Progress Notes (Signed)
 Complex Care Management Note Care Guide Note  12/23/2023 Name: Monica Thompson MRN: [id number] DOB: [birth year]   Complex Care Management Outreach Attempts: An unsuccessful telephone outreach was attempted today to offer the patient information about available complex care management services.  Follow Up Plan:  Additional outreach attempts will be made to offer the patient complex care management information and services.   Encounter Outcome:  No Answer  Gwenevere Ghazi  [facility]  [facility], [facility] Guide  Direct Dial: [phone]  Fax [phone]

## 2023-12-23 NOTE — Progress Notes (Unsigned)
 ZIO serial # [id number] from office inventory applied to patient.  Dr. Odis Hollingshead to read.

## 2023-12-23 NOTE — Patient Instructions (Addendum)
 Medication Instructions:  CHANGE Metoprolol to 25mg  Take 1 tablet twice a day  *If you need a refill on your cardiac medications before your next appointment, please call your pharmacy*   Lab Work: 6 WEEKS FASTING-LIPIDS & LFTs If you have labs (blood work) drawn today and your tests are completely normal, you will receive your results only by: MyChart Message (if you have MyChart) OR A paper copy in the mail If you have any lab test that is abnormal or we need to change your treatment, we will call you to review the results.   Testing/Procedures: Monica Thompson- Long Term Monitor Instructions  Your physician has requested you wear a ZIO patch monitor for 14 days.  This is a single patch monitor. [employer] supplies one patch monitor per enrollment. Additional stickers are not available. Please do not apply patch if you will be having a Nuclear Stress Test,  Echocardiogram, Cardiac CT, MRI, or Chest Xray during the period you would be wearing the  monitor. The patch cannot be worn during these tests. You cannot remove and re-apply the  ZIO XT patch monitor.  Your ZIO patch monitor will be mailed 3 day USPS to your address on file. It may take 3-5 days  to receive your monitor after you have been enrolled.  Once you have received your monitor, please review the enclosed instructions. Your monitor  has already been registered assigning a specific monitor serial # to you.  Billing and Patient Assistance Program Information  We have supplied [employer] with any of your insurance information on file for billing purposes. [employer] offers a sliding scale Patient Assistance Program for patients that do not have  insurance, or whose insurance does not completely cover the cost of the ZIO monitor.  You must apply for the Patient Assistance Program to qualify for this discounted rate.  To apply, please call [employer] at [phone], select option 4, select option 2, ask to apply for  Patient Assistance  Program. Monica Thompson will ask your household income, and how many people  are in your household. They will quote your out-of-pocket cost based on that information.  [employer] will also be able to set up a 3-month, interest-free payment plan if needed.  Applying the monitor   Shave hair from upper left chest.  Hold abrader disc by orange tab. Rub abrader in 40 strokes over the upper left chest as  indicated in your monitor instructions.  Clean area with 4 enclosed alcohol pads. Let dry.  Apply patch as indicated in monitor instructions. Patch will be placed under collarbone on left  side of chest with arrow pointing upward.  Rub patch adhesive wings for 2 minutes. Remove white label marked "1". Remove the white  label marked "2". Rub patch adhesive wings for 2 additional minutes.  While looking in a mirror, press and release button in center of patch. A small green light will  flash 3-4 times. This will be your only indicator that the monitor has been turned on.  Do not shower for the first 24 hours. You may shower after the first 24 hours.  Press the button if you feel a symptom. You will hear a small click. Record Date, Time and  Symptom in the Patient Logbook.  When you are ready to remove the patch, follow instructions on the last 2 pages of Patient  Logbook. Stick patch monitor onto the last page of Patient Logbook.  Place Patient Logbook in the blue and white box. Use locking tab on  box and tape box closed  securely. The blue and white box has prepaid postage on it. Please place it in the mailbox as  soon as possible. Your physician should have your test results approximately 7 days after the  monitor has been mailed back to [facility].  Call [facility] Customer Care at [phone] if you have questions regarding  your ZIO XT patch monitor. Call them immediately if you see an orange light blinking on your  monitor.  If your monitor falls off in less than 4 days, contact our  Monitor department at [phone].  If your monitor becomes loose or falls off after 4 days call [employer] at [phone] for  suggestions on securing your monitor.   Follow-Up: At [facility], you and your health needs are our priority.  As part of our continuing mission to provide you with exceptional heart care, we have created designated Provider Care Teams.  These Care Teams include your primary Cardiologist (physician) and Advanced Practice Providers (APPs -  Physician Assistants and Nurse Practitioners) who all work together to provide you with the care you need, when you need it.  We recommend signing up for the patient portal called "MyChart".  Sign up information is provided on this After Visit Summary.  MyChart is used to connect with patients for Virtual Visits (Telemedicine).  Patients are able to view lab/test results, encounter notes, upcoming appointments, etc.  Non-urgent messages can be sent to your provider as well.   To learn more about what you can do with MyChart, go to [email or web address].    Your next appointment:   6 month(s)  Provider:   Tessa Lerner, MD   Other Instructions

## 2023-12-23 NOTE — Progress Notes (Signed)
 Complex Care Management Note  Care Guide Note 12/23/2023 Name: Monica Thompson MRN: [id number] DOB: 1951-08-26  Monica Thompson is a 73 y.o. year old female who sees Kirstie Peri, MD for primary care. I reached out to Monica Thompson by phone today to offer complex care management services.  Monica Thompson daughter Monica Thompson was given information about Complex Care Management services today including:   The Complex Care Management services include support from the care team which includes your Nurse Care Manager, Clinical Social Worker, or Pharmacist.  The Complex Care Management team is here to help remove barriers to the health concerns and goals most important to you. Complex Care Management services are voluntary, and the patient may decline or stop services at any time by request to their care team member.   Complex Care Management Consent Status: Patient daughter Monica Thompson,Monica Thompson agreed to services and verbal consent obtained.   Follow up plan:  Telephone appointment with complex care management team member scheduled for:  3/18 and 3/12 with RNCM follow up medication clarification needs  Encounter Outcome:  Patient Scheduled  Gwenevere Ghazi  [facility]  [facility], [facility] Guide  Direct Dial: [phone]  Fax [phone]

## 2023-12-24 NOTE — Telephone Encounter (Signed)
-----   Message from Napoleon Form, Leodis Rains sent at 12/24/2023 10:00 AM EDT ----- Regarding: FW: Metoprolol Dose Change Please contact Ms. Larock and let her know that we will be monitoring her heart rate with a Zio patch with her reduction in metoprolol.  Please add additional 25 mg as needed for tachycardia or heart rate greater than 100 bpm.  Please let me know if you have any further questions.  Thanks, Robin Searing, NP ----- Message ----- From: Gwenith Daily, RN Sent: 12/24/2023   9:51 AM EDT To: Gaston Islam., NP Subject: Metoprolol Dose Change                         Good morning,  I spoke with Ms Hillary daughters, [name] and [name], by telephone late yesterday. They were outreached to enroll in our Care Management program and had concerns about the change in metoprolol from 50mg  BID to 25mg  BID at her OV yesterday. I explained the purpose of metoprolol and that the dose was likely decreased because of her fatigue, lower blood pressure, and heart rate of 50. I explained that 25mg  BID is a more stable dose than taking 50mg  but holding it if HR < 50 or SBP < 100, which was the previous directions. They understood, but are concerned that she may be at an increased risk for SVT episodes with this lower dose.   Can you have someone give them a call to answer any additional questions and provide reassurance?   Please let me know if I can be of any assistance.   Thank you,  Demetrios Loll, RN, BSN [place]  [facility], [facility] Health RN Care Manager Direct Dial: [phone]

## 2023-12-24 NOTE — Telephone Encounter (Signed)
 Spoke with the patients daughter Monica Thompson and she states she spoke with her sister and was concerned because [name] decreased her moms metoprolol. I explained to her why the metoprolol was decreased. I also explained that her mom needs to stay away from artifical salt, such as Accent. She voiced understanding and thanked me for calling her back to help ease her mind.   She also wants to know if her mom can take nu-salt? She states her sister's heart doctor recommended it and she want to know if it was safe for her mom use as well?

## 2023-12-24 NOTE — Telephone Encounter (Signed)
 Patients daughter, [name] has been notified and voiced understanding.

## 2023-12-24 NOTE — Telephone Encounter (Signed)
 Gaston Islam., NP  Alveta Heimlich, CMA Caller: Unspecified Burgess Estelle,  4:04 PM) I would advise against this due to the potassium chloride that is an ingredient. Please advise her to try Mrs. Dash or maybe make her own spice blend at home.  Thanks,  [name]       Previous Messages

## 2023-12-28 DIAGNOSIS — I639 Cerebral infarction, unspecified: Secondary | ICD-10-CM | POA: Diagnosis not present

## 2023-12-28 DIAGNOSIS — R109 Unspecified abdominal pain: Secondary | ICD-10-CM | POA: Diagnosis not present

## 2023-12-28 DIAGNOSIS — R42 Dizziness and giddiness: Secondary | ICD-10-CM | POA: Diagnosis not present

## 2023-12-28 DIAGNOSIS — Z743 Need for continuous supervision: Secondary | ICD-10-CM | POA: Diagnosis not present

## 2023-12-28 DIAGNOSIS — R918 Other nonspecific abnormal finding of lung field: Secondary | ICD-10-CM | POA: Diagnosis not present

## 2023-12-28 DIAGNOSIS — K5730 Diverticulosis of large intestine without perforation or abscess without bleeding: Secondary | ICD-10-CM | POA: Diagnosis not present

## 2023-12-28 DIAGNOSIS — R112 Nausea with vomiting, unspecified: Secondary | ICD-10-CM | POA: Diagnosis not present

## 2023-12-28 DIAGNOSIS — R4182 Altered mental status, unspecified: Secondary | ICD-10-CM | POA: Diagnosis not present

## 2023-12-28 DIAGNOSIS — G4489 Other headache syndrome: Secondary | ICD-10-CM | POA: Diagnosis not present

## 2023-12-28 DIAGNOSIS — I6621 Occlusion and stenosis of right posterior cerebral artery: Secondary | ICD-10-CM | POA: Diagnosis not present

## 2023-12-28 DIAGNOSIS — I6501 Occlusion and stenosis of right vertebral artery: Secondary | ICD-10-CM | POA: Diagnosis not present

## 2023-12-28 DIAGNOSIS — I959 Hypotension, unspecified: Secondary | ICD-10-CM | POA: Diagnosis not present

## 2023-12-28 DIAGNOSIS — E119 Type 2 diabetes mellitus without complications: Secondary | ICD-10-CM | POA: Diagnosis not present

## 2023-12-28 DIAGNOSIS — I6523 Occlusion and stenosis of bilateral carotid arteries: Secondary | ICD-10-CM | POA: Diagnosis not present

## 2023-12-28 DIAGNOSIS — Z6841 Body Mass Index (BMI) 40.0 and over, adult: Secondary | ICD-10-CM | POA: Diagnosis not present

## 2023-12-28 DIAGNOSIS — I10 Essential (primary) hypertension: Secondary | ICD-10-CM | POA: Diagnosis not present

## 2023-12-28 DIAGNOSIS — R509 Fever, unspecified: Secondary | ICD-10-CM | POA: Diagnosis not present

## 2023-12-28 DIAGNOSIS — R0689 Other abnormalities of breathing: Secondary | ICD-10-CM | POA: Diagnosis not present

## 2023-12-29 ENCOUNTER — Encounter: Payer: Self-pay | Admitting: *Deleted

## 2023-12-29 ENCOUNTER — Ambulatory Visit: Payer: Self-pay | Admitting: *Deleted

## 2023-12-29 DIAGNOSIS — I639 Cerebral infarction, unspecified: Secondary | ICD-10-CM | POA: Diagnosis not present

## 2023-12-29 DIAGNOSIS — R29700 NIHSS score 0: Secondary | ICD-10-CM | POA: Diagnosis not present

## 2023-12-29 DIAGNOSIS — R42 Dizziness and giddiness: Secondary | ICD-10-CM | POA: Diagnosis not present

## 2023-12-29 NOTE — Patient Outreach (Signed)
 Care Coordination   Initial Visit Note    12/23/2023 Name: EVOLETT SOMARRIBA MRN: [id number] DOB: 1951/06/20  Richarda Overlie is a 73 y.o. year old female who sees Kirstie Peri, MD for primary care. I  was outreached by care guide after she spoke with patient's daughter, [name] to schedule an initial visit with me. Patient's daughters were concerned about the decrease in her metoprolol dose. I outreached [name] by telephone and she included her sister [name].  What matters to the patients health and wellness today?  Managing SVT and preventing hospitalization    Goals Addressed             This Visit's Progress    Care Management Needs       Care Management Goals: Patient will keep all medical appointments Patient will begin working with [facility] HHPT, HHOT, and HHRN Patient will use assistive devices for ambulation Patient/daughters will talk with cardiologist regarding metoprolol dose change and concerns that decreasing dose may increase risk for SVT episodes.  Patient will work on motivation to make changes that will positively impact her health Patient will talk with Clinical Pharmacist about diabetes and obesity medication management Patient/daughters will review mailed diabetic nutrition information and information on carbohydrate counting Patient will drink 64 oz of water per day Patient will wear heart monitor for 14 days  Keep a log of any symptoms to correlate with monitor report Patient/daughters will obtain blood pressure monitor Patient/daughters will monitor and record blood pressure and heart rate daily and as needed Patient will take log to provider visits for review Patient will reach out to provider with any heart rate readings less than 50 or systolic blood pressure less than 100 or greater than 150 Patient will utilize [employer] to monitor and record glucose Patient/daughters will reach out to RN Care Manager at [phone] with any resource or care  management needs        SDOH assessments and interventions completed:  Yes  SDOH Interventions Today    Flowsheet Row Most Recent Value  SDOH Interventions   Food Insecurity Interventions Intervention Not Indicated  Housing Interventions Intervention Not Indicated  Transportation Interventions Patient Resources (Friends/Family)  Physical Activity Interventions Other (Comments)  [patient will be begin working with HHPT & HHOT to increase mobility]        Care Coordination Interventions:  Yes, provided  Interventions Today    Flowsheet Row Most Recent Value  Chronic Disease   Chronic disease during today's visit Diabetes, Other  [SVT, Obesity]  General Interventions   General Interventions Discussed/Reviewed General Interventions Discussed, General Interventions Reviewed, Labs, Doctor Visits, Communication with, Durable Medical Equipment (DME)  Labs Hgb A1c every 3 months  Doctor Visits Discussed/Reviewed Doctor Visits Discussed, Doctor Visits Reviewed, PCP, Specialist  [Saw PCP on 12/20/13 after discharge from SNF.]  Durable Medical Equipment (DME) Glucomoter, Other, Aki  [facility] but has not started using it. Has order for blood pressure monitor. Considering wheelchair or rolling Mckoy. Blood sugar 162 this morning. Wearing 14 day Zio heart monitor.]  PCP/Specialist Visits Compliance with follow-up visit  [cardiologist on 12/30/23]  Communication with PCP/Specialists  [Staff mesage to cardiologist regarding daughters's concerns that decreasing metoprolol to 25mg  BID from 50 BID may increase risk for SVT episodes. Cardiology to outreach.]  Exercise Interventions   Exercise Discussed/Reviewed Exercise Discussed, Exercise Reviewed, Physical Activity, Weight Managment, Assistive device use and maintanence  [Unable to perform ADLs independently. Lives with daughter. Can ambulate short distances with Knepp.]  Physical Activity Discussed/Reviewed Physical Activity  Discussed, Physical Activity Reviewed  [Will begin working with [facility] HHTP and HHOT. They aclled on 12/22/23 and will call back to schedule initial visit. Mobility limited due to chronic hip pain, deconditioning, and body habitus.]  Weight Management Weight loss  Education Interventions   Education Provided Provided Printed Education, Provided Education  [Printed education on Diabetes and Nutrition and Carbohydrate Counting. Both include a list of foods to avoid and portion sizes for appropriate foods.]  Provided Verbal Education On Nutrition, Labs, Blood Sugar Monitoring, Mental Health/Coping with Illness, Medication, When to see the doctor, Exercise, Other, Community Resources  [facility] & when to monitor blood pressure. Monitoring heart rate. [facility] may be an option for transportation. Seek medical attention for rapidly fluctuating heart rate or elevated heart rate or for any new or worsening symptoms.]  Labs Reviewed Hgb A1c, Kidney Function  Mental Health Interventions   Mental Health Discussed/Reviewed Mental Health Discussed, Mental Health Reviewed, Other  [Recent discharge from [facility] SNF after hospitalization. Per daughters, not very motivated to change. "I think sometimes she just don't care"]  Nutrition Interventions   Nutrition Discussed/Reviewed Nutrition Discussed, Nutrition Reviewed, Fluid intake, Carbohydrate meal planning, Adding fruits and vegetables, Decreasing sugar intake, Portion sizes  [drink 64 oz of water per day. Eat 3 meals per day with 30 GM of CHO and up to 2 snacks per day, if needed, with less than 15 GM of CHO. Patient does not have teeth or dentures.]  Pharmacy Interventions   Pharmacy Dicussed/Reviewed Pharmacy Topics Discussed, Pharmacy Topics Reviewed, Medications and their functions, Affording Medications, Referral to Pharmacist  [facility] received insurance prior authorization. Copay is unaffordable at over $400. Metoprolol dose decreased from 50mg  BID to 25mg  BID. Did  not tolerate amiodarone. Cardiologist may consider ablation if SVT unmanaged by medication.]  Referral to Pharmacist Cannot afford medications  [medication management of diabetes and obesity]  Safety Interventions   Safety Discussed/Reviewed Safety Discussed, Safety Reviewed, Fall Risk, Home Safety  Home Safety Assistive Devices  [Has a large Kary due to patient's size. Ciano is too big to fit through the bathroom door. Would like a wheelchair or rolling Collison to use in the kitchen to increase independence since she can't hold Sada and fix food.]  Advanced Directive Interventions   Advanced Directives Discussed/Reviewed Advanced Directives Discussed  [encouraged to consider]       Follow up plan: Follow up call scheduled for 12/29/23. Originally scheduled for initial visit.     Encounter Outcome:  Patient Visit Completed   Demetrios Loll, RN, BSN [place]  [facility], [facility] RN Care Manager Direct Dial: [phone]

## 2023-12-29 NOTE — Patient Outreach (Signed)
 Care Coordination   Follow Up Visit Note   12/29/2023 Name: Monica Thompson MRN: [id number] DOB: 04/20/1951  Monica Thompson is a 73 y.o. year old female who sees Kirstie Peri, MD for primary care. I  spoke with daughter, [name], by telephone today.   What matters to the patients health and wellness today?  Transferring from [facility] to [facility] for management of potential CVA symptoms and acute non occlusive thrombus in MCA.    Goals Addressed             This Visit's Progress    Discharge from Hospital       Care Management Goals: Patient will be transferred from [facility] To [facility] this evening  Per daughter, bed is available. Waiting on transport.  Daughter will ask for DME: Wheelchair prior to hospital discharge, if physical therapy agrees that it is appropriate Daughter will call RN Care Manager at [phone] with discharge date when it is known        SDOH assessments and interventions completed:  No     Care Coordination Interventions:  Yes, provided  Interventions Today    Flowsheet Row Most Recent Value  Chronic Disease   Chronic disease during today's visit Other  [Current hospitalization at [facility] for N/V, dizziness, & headache with L MCA acute nonocclusive thrombus seen on CT scan]  General Interventions   General Interventions Discussed/Reviewed General Interventions Discussed, General Interventions Reviewed, Durable Medical Equipment (DME), Labs, Level of Care  [reviewed inpatient notes, imaging reports, and lab results]  Durable Medical Equipment (DME) Wheelchair  [Encouraged daughter to request DME: wheelchair prior to hospital discharge]  Wheelchair Standard  Level of Care --  [currently hospitalized at [facility]. Will be transferred to [facility] this evening.]  Education Interventions   Education Provided Provided Education       Follow up plan:  Will await  call from patient's daughter when discharge date is known    Encounter Outcome:  Patient Visit Completed   Demetrios Loll, RN, BSN [place]  [facility], [facility] RN Care Manager Direct Dial: [phone]

## 2023-12-30 ENCOUNTER — Ambulatory Visit: Payer: Medicare HMO | Admitting: Student

## 2023-12-30 DIAGNOSIS — I517 Cardiomegaly: Secondary | ICD-10-CM | POA: Diagnosis not present

## 2023-12-30 DIAGNOSIS — R918 Other nonspecific abnormal finding of lung field: Secondary | ICD-10-CM | POA: Diagnosis not present

## 2023-12-30 DIAGNOSIS — R4182 Altered mental status, unspecified: Secondary | ICD-10-CM | POA: Diagnosis not present

## 2023-12-30 DIAGNOSIS — Z794 Long term (current) use of insulin: Secondary | ICD-10-CM | POA: Diagnosis not present

## 2023-12-30 DIAGNOSIS — I63442 Cerebral infarction due to embolism of left cerebellar artery: Secondary | ICD-10-CM | POA: Diagnosis not present

## 2023-12-30 DIAGNOSIS — E1142 Type 2 diabetes mellitus with diabetic polyneuropathy: Secondary | ICD-10-CM | POA: Diagnosis not present

## 2023-12-30 DIAGNOSIS — E876 Hypokalemia: Secondary | ICD-10-CM | POA: Diagnosis not present

## 2023-12-30 DIAGNOSIS — I959 Hypotension, unspecified: Secondary | ICD-10-CM | POA: Diagnosis not present

## 2023-12-30 DIAGNOSIS — K219 Gastro-esophageal reflux disease without esophagitis: Secondary | ICD-10-CM | POA: Diagnosis not present

## 2023-12-30 DIAGNOSIS — I10 Essential (primary) hypertension: Secondary | ICD-10-CM | POA: Diagnosis not present

## 2023-12-30 DIAGNOSIS — I129 Hypertensive chronic kidney disease with stage 1 through stage 4 chronic kidney disease, or unspecified chronic kidney disease: Secondary | ICD-10-CM | POA: Diagnosis not present

## 2023-12-30 DIAGNOSIS — G63 Polyneuropathy in diseases classified elsewhere: Secondary | ICD-10-CM | POA: Diagnosis not present

## 2023-12-30 DIAGNOSIS — Z6841 Body Mass Index (BMI) 40.0 and over, adult: Secondary | ICD-10-CM | POA: Diagnosis not present

## 2023-12-30 DIAGNOSIS — R29700 NIHSS score 0: Secondary | ICD-10-CM | POA: Diagnosis not present

## 2023-12-30 DIAGNOSIS — E1165 Type 2 diabetes mellitus with hyperglycemia: Secondary | ICD-10-CM | POA: Diagnosis not present

## 2023-12-30 DIAGNOSIS — I2510 Atherosclerotic heart disease of native coronary artery without angina pectoris: Secondary | ICD-10-CM | POA: Diagnosis not present

## 2023-12-30 DIAGNOSIS — J449 Chronic obstructive pulmonary disease, unspecified: Secondary | ICD-10-CM | POA: Diagnosis not present

## 2023-12-30 DIAGNOSIS — F17210 Nicotine dependence, cigarettes, uncomplicated: Secondary | ICD-10-CM | POA: Diagnosis not present

## 2023-12-30 DIAGNOSIS — E538 Deficiency of other specified B group vitamins: Secondary | ICD-10-CM | POA: Diagnosis not present

## 2023-12-30 DIAGNOSIS — R209 Unspecified disturbances of skin sensation: Secondary | ICD-10-CM | POA: Diagnosis not present

## 2023-12-30 DIAGNOSIS — R2681 Unsteadiness on feet: Secondary | ICD-10-CM | POA: Diagnosis not present

## 2023-12-30 DIAGNOSIS — R0602 Shortness of breath: Secondary | ICD-10-CM | POA: Diagnosis not present

## 2023-12-30 DIAGNOSIS — E1151 Type 2 diabetes mellitus with diabetic peripheral angiopathy without gangrene: Secondary | ICD-10-CM | POA: Diagnosis not present

## 2023-12-30 DIAGNOSIS — E559 Vitamin D deficiency, unspecified: Secondary | ICD-10-CM | POA: Diagnosis not present

## 2023-12-30 DIAGNOSIS — R509 Fever, unspecified: Secondary | ICD-10-CM | POA: Diagnosis not present

## 2023-12-30 DIAGNOSIS — J811 Chronic pulmonary edema: Secondary | ICD-10-CM | POA: Diagnosis not present

## 2023-12-30 DIAGNOSIS — R9082 White matter disease, unspecified: Secondary | ICD-10-CM | POA: Diagnosis not present

## 2023-12-30 DIAGNOSIS — E871 Hypo-osmolality and hyponatremia: Secondary | ICD-10-CM | POA: Diagnosis not present

## 2023-12-30 DIAGNOSIS — N182 Chronic kidney disease, stage 2 (mild): Secondary | ICD-10-CM | POA: Diagnosis not present

## 2023-12-30 DIAGNOSIS — E1141 Type 2 diabetes mellitus with diabetic mononeuropathy: Secondary | ICD-10-CM | POA: Diagnosis not present

## 2023-12-30 DIAGNOSIS — E1122 Type 2 diabetes mellitus with diabetic chronic kidney disease: Secondary | ICD-10-CM | POA: Diagnosis not present

## 2023-12-30 DIAGNOSIS — R42 Dizziness and giddiness: Secondary | ICD-10-CM | POA: Diagnosis not present

## 2023-12-30 DIAGNOSIS — I6350 Cerebral infarction due to unspecified occlusion or stenosis of unspecified cerebral artery: Secondary | ICD-10-CM | POA: Diagnosis not present

## 2023-12-30 DIAGNOSIS — Z7409 Other reduced mobility: Secondary | ICD-10-CM | POA: Diagnosis not present

## 2023-12-30 DIAGNOSIS — E782 Mixed hyperlipidemia: Secondary | ICD-10-CM | POA: Diagnosis not present

## 2023-12-30 DIAGNOSIS — I70202 Unspecified atherosclerosis of native arteries of extremities, left leg: Secondary | ICD-10-CM | POA: Diagnosis not present

## 2024-01-01 NOTE — Progress Notes (Signed)
   01/01/2024  Patient ID: Monica Thompson, female   DOB: 1950-11-07, 73 y.o.   MRN: [id number]  Referral was placed for this patient for medication assistance, but she is currently admitted at [facility] for vertigo/ stroke. Will follow up next week.   Thank you for allowing pharmacy to be a part of this patient's care. Cephus Shelling, PharmD Clinical Pharmacist Cell: [phone]

## 2024-01-06 ENCOUNTER — Telehealth: Payer: Self-pay

## 2024-01-06 DIAGNOSIS — E119 Type 2 diabetes mellitus without complications: Secondary | ICD-10-CM

## 2024-01-06 NOTE — Progress Notes (Signed)
   01/06/2024  Patient ID: Monica Thompson, female   DOB: 09/17/1951, 73 y.o.   MRN: [id number]  A referral was placed for medication assistance for this patient, but she is still admitted at [facility] for a stroke. Per chart review in [facility] EMR, her daughter, [name], was seen in the clinic to discuss her mother. According to [facility] admission notes, discharge placement is pending for either home health [facility]) or a skilled nursing facility (SNF), as the patient has run out of insurance coverage. Follow-up will be conducted in 3-7 days to determine the patient's discharge status (home or SNF) and any medication changes or needs.  Thank you for allowing pharmacy to be a part of this patient's care. Cephus Shelling, PharmD Clinical Pharmacist Cell: [phone]

## 2024-01-13 ENCOUNTER — Other Ambulatory Visit: Payer: Self-pay

## 2024-01-13 DIAGNOSIS — Z5986 Financial insecurity: Secondary | ICD-10-CM

## 2024-01-13 NOTE — Progress Notes (Signed)
   01/13/2024  Patient ID: Monica Thompson, female   DOB: [birth year], 73 y.o.   MRN: [id number]  Referral Update  A referral was made for medication assistance for Ozempic, but she is still admitted at [facility] for a stroke. I called and spoke with the patient's daughter, Alyson Ingles, who stated that Ms. Gish is still in the hospital. Discharge placement is pending due to insurance issues. The family will have to pay out-of-pocket for home health, and the caseworker is exploring other options for facility placement, as the family will not be able to provide around-the-clock care for the patient when she is discharged home.   Follow-up will be conducted in 7 days to determine the patient's discharge status (home or SNF) and any medication changes or needs. Provided [name] with my contact information.   Thank you for allowing pharmacy to be a part of this patient's care. Cephus Shelling, PharmD Clinical Pharmacist Cell: [phone]

## 2024-01-17 DIAGNOSIS — E785 Hyperlipidemia, unspecified: Secondary | ICD-10-CM | POA: Diagnosis not present

## 2024-01-17 DIAGNOSIS — I10 Essential (primary) hypertension: Secondary | ICD-10-CM | POA: Diagnosis not present

## 2024-01-17 DIAGNOSIS — I2510 Atherosclerotic heart disease of native coronary artery without angina pectoris: Secondary | ICD-10-CM | POA: Diagnosis not present

## 2024-01-17 DIAGNOSIS — E119 Type 2 diabetes mellitus without complications: Secondary | ICD-10-CM

## 2024-01-17 DIAGNOSIS — Z794 Long term (current) use of insulin: Secondary | ICD-10-CM | POA: Diagnosis not present

## 2024-01-17 DIAGNOSIS — I471 Supraventricular tachycardia, unspecified: Secondary | ICD-10-CM | POA: Diagnosis not present

## 2024-01-20 DIAGNOSIS — E785 Hyperlipidemia, unspecified: Secondary | ICD-10-CM | POA: Diagnosis not present

## 2024-01-20 DIAGNOSIS — E1165 Type 2 diabetes mellitus with hyperglycemia: Secondary | ICD-10-CM | POA: Diagnosis not present

## 2024-01-20 DIAGNOSIS — G4733 Obstructive sleep apnea (adult) (pediatric): Secondary | ICD-10-CM | POA: Diagnosis not present

## 2024-01-20 DIAGNOSIS — I10 Essential (primary) hypertension: Secondary | ICD-10-CM | POA: Diagnosis not present

## 2024-01-20 DIAGNOSIS — R5381 Other malaise: Secondary | ICD-10-CM | POA: Diagnosis not present

## 2024-01-20 DIAGNOSIS — I672 Cerebral atherosclerosis: Secondary | ICD-10-CM | POA: Diagnosis not present

## 2024-01-20 DIAGNOSIS — Z8673 Personal history of transient ischemic attack (TIA), and cerebral infarction without residual deficits: Secondary | ICD-10-CM | POA: Diagnosis not present

## 2024-01-20 NOTE — Progress Notes (Signed)
   01/20/2024  Patient ID: Monica Thompson, female   DOB: 12-20-1950, 73 y.o.   MRN: [id number]  Initial Referral note: Obesity. Prescribed ozempic and received insurance PA but is too expensive. Metformin was d/c.   Patient discharged on 01/16/2024 with home health. Upcoming appointment on 01/21/2024 with PCP/  Medications on discharge that could be medication assistance opportunities:  - Lantus Solostar 18 units daily.   -Novolog 2-10 units 15 min before meals  - verify patient has picked up and takes ASA 81 mg daily, plavix 75 mg daily, atorvastatin 40 mg daily (dose increased), losartan 75 mg once daily (25 mgx3 once daily), metoprolol tartrate 25 mg bid (dose reduced from 50 mg daily) -- I DO NOT SEE A NEW RX FOR THIS DOSE REDUCTION   -Patient has an appointment tomorrow with PCP and will follow up on this Friday in case there will be medication changes during the appointment with PCP.   Cephus Shelling, PharmD Clinical Pharmacist Cell: [phone]

## 2024-01-22 ENCOUNTER — Telehealth: Payer: Self-pay

## 2024-01-22 ENCOUNTER — Other Ambulatory Visit: Payer: Self-pay

## 2024-01-22 DIAGNOSIS — Z5986 Financial insecurity: Secondary | ICD-10-CM

## 2024-01-22 NOTE — Telephone Encounter (Addendum)
 Spoke with the patients daughter to give monitor results. Daughter states the patient had to remove her monitor after wearing it for 5 days because she had a stroke.   She states her pulse rate is running in the 50's and that her mom is complaining of chest discomfort that feels like svt. She states the patient is not having any chest pain. She states she is still giving her mom the metoprolol bid and is not going to make any changes unless our office tells her to.   She states her mom went to see her pcp this week but was informed to follow up with our office.  She wants to know if her mom needs to lose weight before she can have the ablation?   She also wants to know if her mom can wear another monitor and would the insurance cover it, since her mom is having chest that feels like svt?   I advised the patients daughter that I would send Dr Elberta Fortis and his nurse a message and we would be in touch with possibly after clinic or soon.   She voiced understanding.

## 2024-01-22 NOTE — Progress Notes (Addendum)
   01/22/2024  Patient ID: Monica Thompson, female   DOB: 09/12/1951, 73 y.o.   MRN: [id number]  Her daughter called back and stated it was a good time to review information for medication assistance. However, she received a call prior to the medication assistance assessment and stated a doctor's office was calling her and she would return my call.  She did call me back again, but I was on the phone with another patient and will call back.   Cephus Shelling, PharmD Clinical Pharmacist Cell: [phone]

## 2024-01-22 NOTE — Progress Notes (Signed)
   01/22/2024  Patient ID: Monica Thompson, female   DOB: [birth year], 73 y.o.   MRN: [id number]  The pharmacy received a referral for Ozempic. Although the provider completed the prior authorization, the out-of-pocket cost was too high for the patient. The patient was recently seen by her primary care provider for a post-discharge follow-up following a stroke. During that visit, the provider reportedly discussed initiating Ozempic.  The patient stated that she does not manage her medications or finances and relies on her daughter, [name], for assistance with these matters. The patient noted that [name] was working today and also had a doctor's appointment. I attempted to contact [place] by phone but was unable to reach her. A voicemail was left requesting a return call.  Thank you for allowing pharmacy to be a part of this patient's care. Cephus Shelling, PharmD Clinical Pharmacist Cell: [phone]

## 2024-01-25 DIAGNOSIS — Z87891 Personal history of nicotine dependence: Secondary | ICD-10-CM | POA: Diagnosis not present

## 2024-01-25 DIAGNOSIS — J449 Chronic obstructive pulmonary disease, unspecified: Secondary | ICD-10-CM | POA: Diagnosis not present

## 2024-01-25 DIAGNOSIS — Z6841 Body Mass Index (BMI) 40.0 and over, adult: Secondary | ICD-10-CM | POA: Diagnosis not present

## 2024-01-25 DIAGNOSIS — Z7902 Long term (current) use of antithrombotics/antiplatelets: Secondary | ICD-10-CM | POA: Diagnosis not present

## 2024-01-25 DIAGNOSIS — Z794 Long term (current) use of insulin: Secondary | ICD-10-CM | POA: Diagnosis not present

## 2024-01-25 DIAGNOSIS — N182 Chronic kidney disease, stage 2 (mild): Secondary | ICD-10-CM | POA: Diagnosis not present

## 2024-01-25 DIAGNOSIS — E1141 Type 2 diabetes mellitus with diabetic mononeuropathy: Secondary | ICD-10-CM | POA: Diagnosis not present

## 2024-01-25 DIAGNOSIS — E1122 Type 2 diabetes mellitus with diabetic chronic kidney disease: Secondary | ICD-10-CM | POA: Diagnosis not present

## 2024-01-25 DIAGNOSIS — I471 Supraventricular tachycardia, unspecified: Secondary | ICD-10-CM | POA: Diagnosis not present

## 2024-01-25 DIAGNOSIS — E1165 Type 2 diabetes mellitus with hyperglycemia: Secondary | ICD-10-CM | POA: Diagnosis not present

## 2024-01-25 DIAGNOSIS — Z7982 Long term (current) use of aspirin: Secondary | ICD-10-CM | POA: Diagnosis not present

## 2024-01-25 DIAGNOSIS — I129 Hypertensive chronic kidney disease with stage 1 through stage 4 chronic kidney disease, or unspecified chronic kidney disease: Secondary | ICD-10-CM | POA: Diagnosis not present

## 2024-01-25 DIAGNOSIS — I10 Essential (primary) hypertension: Secondary | ICD-10-CM | POA: Diagnosis not present

## 2024-01-25 DIAGNOSIS — I679 Cerebrovascular disease, unspecified: Secondary | ICD-10-CM | POA: Diagnosis not present

## 2024-01-26 ENCOUNTER — Other Ambulatory Visit: Payer: Self-pay

## 2024-01-26 DIAGNOSIS — I2510 Atherosclerotic heart disease of native coronary artery without angina pectoris: Secondary | ICD-10-CM

## 2024-01-26 DIAGNOSIS — I69359 Hemiplegia and hemiparesis following cerebral infarction affecting unspecified side: Secondary | ICD-10-CM | POA: Diagnosis not present

## 2024-01-26 DIAGNOSIS — R55 Syncope and collapse: Secondary | ICD-10-CM

## 2024-01-26 DIAGNOSIS — I10 Essential (primary) hypertension: Secondary | ICD-10-CM

## 2024-01-26 NOTE — Telephone Encounter (Signed)
 Pt called. Spoke with [name] who would like a General Cardiology provider in [place] as addressed with Renelda Carry. Referral placed.

## 2024-01-27 NOTE — Telephone Encounter (Signed)
 Does she need to be rather soon or, 56m like Charles Connor said in his last OV note?

## 2024-01-28 ENCOUNTER — Other Ambulatory Visit: Payer: Self-pay

## 2024-01-28 DIAGNOSIS — Z5986 Financial insecurity: Secondary | ICD-10-CM

## 2024-01-28 NOTE — Progress Notes (Signed)
   01/28/2024 Name: Monica Thompson MRN: [id number] DOB: 1950/10/30  No chief complaint on file.   Monica Thompson is a 73 y.o. year old female who presented for a telephone visit with daughter Monica Thompson (identified in EMR as HIPAA compliant).   They were referred to the pharmacist by their PCP for assistance in managing medication access specifically for Ozempic. Patient has not started medication as the out-of-pocket cost is very expensive. However, shortly after pharmacist received referral patient had a stroke   Subjective:  Care Team: Primary Care Provider: Theoplis Fix, MD    Medication Access/Adherence  Current Pharmacy:  [facility] - [place], [place] - [address] [place] [address] Phone: [phone] Fax: [phone]  [facility] [address] [place] - [address] [place] [address] [place] [address] [place] [address] Phone: [phone] Fax: [phone]   Objective:  Lab Results  Component Value Date   HGBA1C 10.4 (H) 11/22/2023    Lab Results  Component Value Date   CREATININE 1.00 11/30/2023   BUN 11 11/30/2023   NA 138 11/30/2023   K 3.9 11/30/2023   CL 102 11/30/2023   CO2 28 11/30/2023    Lab Results  Component Value Date   CHOL 239 (H) 11/25/2023   HDL 37 (L) 11/25/2023   LDLCALC UNABLE TO CALCULATE IF TRIGLYCERIDE OVER 400 mg/dL 74/25/9563   LDLDIRECT 162 (H) 11/25/2023   TRIG 434 (H) 11/25/2023   CHOLHDL 6.5 11/25/2023    Medications Reviewed Today   Medications were not reviewed in this encounter       Assessment/Plan:  The patient was previously prescribed NovoLog  and Basaglar  (recently changed to Lantus  on discharge from hospitalization). Her daughter reports that she recently picked up both insulins for a one-month supply for $70 each, just prior to hospitalization. Her current basal insulin  regimen needs to be verified, and clarification from her primary care provider (PCP) is  required should patient require medication assistance  She is pursuing patient assistance program (PAP) support for Ozempic. . Given reported income, LIS application will need to be completed. Informed that it could take 6-8 weeks for the application to be approved. Daughter did report that her mother has a 23k, but there was not a lot of money in the account.  The patient is temporarily living with Monica Thompson and checks her mother's mail once weekly. A DAPT is in place for 90 days which includes hospitalization on Plavix, per neurology documentation dated 01/20/2024.  The PCP's authorization is needed to refill/renew the following medications: Losartan (Patient has already submitted two requests to the pharmacy and is awaiting a response from PCP) Gabapentin  400 mg BID Clopidogrel 75 mg daily (A 30-day supply is needed to complete a total of 90 days) Atorvastatin  40 mg at bedtime Vitamin D2 1.25 mg Pantoprazole  40 mg  I contacted the pharmacy to inquire about an emergency fill or loan supply. They provided a 3-day supply (9 tablets), which will run out by Sunday. I also contacted the clinic to request that these medications be refilled.  I will also being medication assistance application (Basaglar , Novolog , Ozempic), in case the patient doesn't qualify for LIS. Will clarify with PCP about preferred basal insulin  as well a prescription for Ozempic.   Updated patient's address in [facility] EMR.  Insurance verification:  [facility]    Alexandria Angel, PharmD Clinical Pharmacist Cell: [phone]

## 2024-01-29 ENCOUNTER — Ambulatory Visit: Admitting: Nurse Practitioner

## 2024-02-03 ENCOUNTER — Other Ambulatory Visit (HOSPITAL_COMMUNITY): Payer: Self-pay

## 2024-02-03 ENCOUNTER — Telehealth: Payer: Self-pay

## 2024-02-03 DIAGNOSIS — Z79899 Other long term (current) drug therapy: Secondary | ICD-10-CM

## 2024-02-03 NOTE — Progress Notes (Signed)
   02/03/2024  Patient ID: Monica Thompson, female   DOB: [birth year], 73 y.o.   MRN: [id number]  Care Coordination Call  I called the clinic to help get medications renewed that were requested and asked that they send them to her preferred pharmacy at [facility]. Additionally, contacted the clinic about clarification on desired basal insulin  and prescription on file for Ozempic.

## 2024-02-04 DIAGNOSIS — Z6841 Body Mass Index (BMI) 40.0 and over, adult: Secondary | ICD-10-CM | POA: Diagnosis not present

## 2024-02-04 DIAGNOSIS — E1122 Type 2 diabetes mellitus with diabetic chronic kidney disease: Secondary | ICD-10-CM | POA: Diagnosis not present

## 2024-02-04 DIAGNOSIS — Z7982 Long term (current) use of aspirin: Secondary | ICD-10-CM | POA: Diagnosis not present

## 2024-02-04 DIAGNOSIS — E1165 Type 2 diabetes mellitus with hyperglycemia: Secondary | ICD-10-CM | POA: Diagnosis not present

## 2024-02-04 DIAGNOSIS — N182 Chronic kidney disease, stage 2 (mild): Secondary | ICD-10-CM | POA: Diagnosis not present

## 2024-02-04 DIAGNOSIS — Z7902 Long term (current) use of antithrombotics/antiplatelets: Secondary | ICD-10-CM | POA: Diagnosis not present

## 2024-02-04 DIAGNOSIS — I679 Cerebrovascular disease, unspecified: Secondary | ICD-10-CM | POA: Diagnosis not present

## 2024-02-04 DIAGNOSIS — I129 Hypertensive chronic kidney disease with stage 1 through stage 4 chronic kidney disease, or unspecified chronic kidney disease: Secondary | ICD-10-CM | POA: Diagnosis not present

## 2024-02-04 DIAGNOSIS — Z87891 Personal history of nicotine dependence: Secondary | ICD-10-CM | POA: Diagnosis not present

## 2024-02-04 DIAGNOSIS — J449 Chronic obstructive pulmonary disease, unspecified: Secondary | ICD-10-CM | POA: Diagnosis not present

## 2024-02-04 DIAGNOSIS — E1141 Type 2 diabetes mellitus with diabetic mononeuropathy: Secondary | ICD-10-CM | POA: Diagnosis not present

## 2024-02-04 DIAGNOSIS — Z794 Long term (current) use of insulin: Secondary | ICD-10-CM | POA: Diagnosis not present

## 2024-02-05 DIAGNOSIS — I129 Hypertensive chronic kidney disease with stage 1 through stage 4 chronic kidney disease, or unspecified chronic kidney disease: Secondary | ICD-10-CM | POA: Diagnosis not present

## 2024-02-05 DIAGNOSIS — E1141 Type 2 diabetes mellitus with diabetic mononeuropathy: Secondary | ICD-10-CM | POA: Diagnosis not present

## 2024-02-05 DIAGNOSIS — Z87891 Personal history of nicotine dependence: Secondary | ICD-10-CM | POA: Diagnosis not present

## 2024-02-05 DIAGNOSIS — Z7902 Long term (current) use of antithrombotics/antiplatelets: Secondary | ICD-10-CM | POA: Diagnosis not present

## 2024-02-05 DIAGNOSIS — Z7982 Long term (current) use of aspirin: Secondary | ICD-10-CM | POA: Diagnosis not present

## 2024-02-05 DIAGNOSIS — J449 Chronic obstructive pulmonary disease, unspecified: Secondary | ICD-10-CM | POA: Diagnosis not present

## 2024-02-05 DIAGNOSIS — I679 Cerebrovascular disease, unspecified: Secondary | ICD-10-CM | POA: Diagnosis not present

## 2024-02-05 DIAGNOSIS — Z794 Long term (current) use of insulin: Secondary | ICD-10-CM | POA: Diagnosis not present

## 2024-02-05 DIAGNOSIS — N182 Chronic kidney disease, stage 2 (mild): Secondary | ICD-10-CM | POA: Diagnosis not present

## 2024-02-05 DIAGNOSIS — E1165 Type 2 diabetes mellitus with hyperglycemia: Secondary | ICD-10-CM | POA: Diagnosis not present

## 2024-02-05 DIAGNOSIS — E1122 Type 2 diabetes mellitus with diabetic chronic kidney disease: Secondary | ICD-10-CM | POA: Diagnosis not present

## 2024-02-05 DIAGNOSIS — Z6841 Body Mass Index (BMI) 40.0 and over, adult: Secondary | ICD-10-CM | POA: Diagnosis not present

## 2024-02-09 DIAGNOSIS — Z7902 Long term (current) use of antithrombotics/antiplatelets: Secondary | ICD-10-CM | POA: Diagnosis not present

## 2024-02-09 DIAGNOSIS — Z794 Long term (current) use of insulin: Secondary | ICD-10-CM | POA: Diagnosis not present

## 2024-02-09 DIAGNOSIS — J449 Chronic obstructive pulmonary disease, unspecified: Secondary | ICD-10-CM | POA: Diagnosis not present

## 2024-02-09 DIAGNOSIS — E1141 Type 2 diabetes mellitus with diabetic mononeuropathy: Secondary | ICD-10-CM | POA: Diagnosis not present

## 2024-02-09 DIAGNOSIS — Z87891 Personal history of nicotine dependence: Secondary | ICD-10-CM | POA: Diagnosis not present

## 2024-02-09 DIAGNOSIS — I679 Cerebrovascular disease, unspecified: Secondary | ICD-10-CM | POA: Diagnosis not present

## 2024-02-09 DIAGNOSIS — E1122 Type 2 diabetes mellitus with diabetic chronic kidney disease: Secondary | ICD-10-CM | POA: Diagnosis not present

## 2024-02-09 DIAGNOSIS — Z7982 Long term (current) use of aspirin: Secondary | ICD-10-CM | POA: Diagnosis not present

## 2024-02-09 DIAGNOSIS — N182 Chronic kidney disease, stage 2 (mild): Secondary | ICD-10-CM | POA: Diagnosis not present

## 2024-02-09 DIAGNOSIS — Z6841 Body Mass Index (BMI) 40.0 and over, adult: Secondary | ICD-10-CM | POA: Diagnosis not present

## 2024-02-09 DIAGNOSIS — E1165 Type 2 diabetes mellitus with hyperglycemia: Secondary | ICD-10-CM | POA: Diagnosis not present

## 2024-02-09 DIAGNOSIS — I129 Hypertensive chronic kidney disease with stage 1 through stage 4 chronic kidney disease, or unspecified chronic kidney disease: Secondary | ICD-10-CM | POA: Diagnosis not present

## 2024-02-11 DIAGNOSIS — Z7902 Long term (current) use of antithrombotics/antiplatelets: Secondary | ICD-10-CM | POA: Diagnosis not present

## 2024-02-11 DIAGNOSIS — J449 Chronic obstructive pulmonary disease, unspecified: Secondary | ICD-10-CM | POA: Diagnosis not present

## 2024-02-11 DIAGNOSIS — Z6841 Body Mass Index (BMI) 40.0 and over, adult: Secondary | ICD-10-CM | POA: Diagnosis not present

## 2024-02-11 DIAGNOSIS — I679 Cerebrovascular disease, unspecified: Secondary | ICD-10-CM | POA: Diagnosis not present

## 2024-02-11 DIAGNOSIS — I129 Hypertensive chronic kidney disease with stage 1 through stage 4 chronic kidney disease, or unspecified chronic kidney disease: Secondary | ICD-10-CM | POA: Diagnosis not present

## 2024-02-11 DIAGNOSIS — E1122 Type 2 diabetes mellitus with diabetic chronic kidney disease: Secondary | ICD-10-CM | POA: Diagnosis not present

## 2024-02-11 DIAGNOSIS — Z794 Long term (current) use of insulin: Secondary | ICD-10-CM | POA: Diagnosis not present

## 2024-02-11 DIAGNOSIS — E1141 Type 2 diabetes mellitus with diabetic mononeuropathy: Secondary | ICD-10-CM | POA: Diagnosis not present

## 2024-02-11 DIAGNOSIS — N182 Chronic kidney disease, stage 2 (mild): Secondary | ICD-10-CM | POA: Diagnosis not present

## 2024-02-11 DIAGNOSIS — Z87891 Personal history of nicotine dependence: Secondary | ICD-10-CM | POA: Diagnosis not present

## 2024-02-11 DIAGNOSIS — E1165 Type 2 diabetes mellitus with hyperglycemia: Secondary | ICD-10-CM | POA: Diagnosis not present

## 2024-02-11 DIAGNOSIS — Z7982 Long term (current) use of aspirin: Secondary | ICD-10-CM | POA: Diagnosis not present

## 2024-02-15 DIAGNOSIS — Z6841 Body Mass Index (BMI) 40.0 and over, adult: Secondary | ICD-10-CM | POA: Diagnosis not present

## 2024-02-15 DIAGNOSIS — Z794 Long term (current) use of insulin: Secondary | ICD-10-CM | POA: Diagnosis not present

## 2024-02-15 DIAGNOSIS — Z87891 Personal history of nicotine dependence: Secondary | ICD-10-CM | POA: Diagnosis not present

## 2024-02-15 DIAGNOSIS — J449 Chronic obstructive pulmonary disease, unspecified: Secondary | ICD-10-CM | POA: Diagnosis not present

## 2024-02-15 DIAGNOSIS — N182 Chronic kidney disease, stage 2 (mild): Secondary | ICD-10-CM | POA: Diagnosis not present

## 2024-02-15 DIAGNOSIS — E1141 Type 2 diabetes mellitus with diabetic mononeuropathy: Secondary | ICD-10-CM | POA: Diagnosis not present

## 2024-02-15 DIAGNOSIS — E1122 Type 2 diabetes mellitus with diabetic chronic kidney disease: Secondary | ICD-10-CM | POA: Diagnosis not present

## 2024-02-15 DIAGNOSIS — Z7982 Long term (current) use of aspirin: Secondary | ICD-10-CM | POA: Diagnosis not present

## 2024-02-15 DIAGNOSIS — I129 Hypertensive chronic kidney disease with stage 1 through stage 4 chronic kidney disease, or unspecified chronic kidney disease: Secondary | ICD-10-CM | POA: Diagnosis not present

## 2024-02-15 DIAGNOSIS — I679 Cerebrovascular disease, unspecified: Secondary | ICD-10-CM | POA: Diagnosis not present

## 2024-02-15 DIAGNOSIS — Z7902 Long term (current) use of antithrombotics/antiplatelets: Secondary | ICD-10-CM | POA: Diagnosis not present

## 2024-02-15 DIAGNOSIS — E1165 Type 2 diabetes mellitus with hyperglycemia: Secondary | ICD-10-CM | POA: Diagnosis not present

## 2024-02-17 ENCOUNTER — Telehealth: Payer: Self-pay

## 2024-02-17 DIAGNOSIS — E119 Type 2 diabetes mellitus without complications: Secondary | ICD-10-CM

## 2024-02-17 NOTE — Progress Notes (Signed)
   02/17/2024 Name: Monica Thompson MRN: [id number] DOB: Jun 15, 1951  Chief Complaint  Patient presents with   Diabetes    Monica Thompson is a 74 y.o. year old female who presented for a telephone visit.   They were referred to the pharmacist by their PCP for assistance in managing diabetes.    Subjective:  Care Team: Primary Care Provider: Theoplis Fix, MD ; Next Scheduled Visit: 02/22/2024   Medication Access/Adherence  Current Pharmacy:  [facility] - [place], [place] - [address] [place] [address] Phone: [phone] Fax: [phone]  [facility] [address], [place] - [address] [place] [address] [place] [address] [place] [address] Phone: [phone] Fax: [phone]   Medication Assistance:  I called patient's daughter for an update on the application process through the drug company. It was made to my attention, by [name], that Basaglar  "hurts her mother and the insulin  comes out too fast". She would prefer that her mother try another insulin  pen. There was also confusion on whether insulin  was start to bridge the patient while Ozempic application is processed or for patient to continue both medications until [name] A1c improves. Dr. Mason Sole was contacted at the clinic regarding clarification, with [name] on the phone, and the Dr. Mason Sole stated he did not prescribe Ozempic and doesn't recall prior conversations surrounding initiation of the medication (pre/post hospitalization) that [name] noted. He stated that he understands the need for Ozempic but would need the patient to be seen in clinic to further evaluate the utilization of the medication. Although the referral came from the Dr. Mason Sole, when the patient was in the hospital the referral may have come from [facility], per our telephone discussion today.    Notably, this pharmacist was not able to access EMR notes in [facility] today.     Objective:  Lab Results  Component Value Date   HGBA1C 10.4 (H) 11/22/2023    Lab Results  Component Value Date   CREATININE 1.00 11/30/2023   BUN 11 11/30/2023   NA 138 11/30/2023   K 3.9 11/30/2023   CL 102 11/30/2023   CO2 28 11/30/2023    Lab Results  Component Value Date   CHOL 239 (H) 11/25/2023   HDL 37 (L) 11/25/2023   LDLCALC UNABLE TO CALCULATE IF TRIGLYCERIDE OVER 400 mg/dL 18/84/1660   LDLDIRECT 162 (H) 11/25/2023   TRIG 434 (H) 11/25/2023   CHOLHDL 6.5 11/25/2023      Assessment/Plan:   {Pharmacy A/P Choices:26421}  Follow Up Plan: ***  Daughter cannot "stand the injection pen of Basaglar  as the insulin  goes in really fast and it hurts her". The Lantus  was never given to patient. Novolog  she is fine.      Called clinic and PCP doesn't recall Ozempic Rx and he did not see it in the chart and doesn't remmeber discussion apparently the hospital placed the referral according to Dr. Mason Sole   He is okay with insulin    Appt 02/22/2024

## 2024-02-18 DIAGNOSIS — Z7902 Long term (current) use of antithrombotics/antiplatelets: Secondary | ICD-10-CM | POA: Diagnosis not present

## 2024-02-18 DIAGNOSIS — E1141 Type 2 diabetes mellitus with diabetic mononeuropathy: Secondary | ICD-10-CM | POA: Diagnosis not present

## 2024-02-18 DIAGNOSIS — I129 Hypertensive chronic kidney disease with stage 1 through stage 4 chronic kidney disease, or unspecified chronic kidney disease: Secondary | ICD-10-CM | POA: Diagnosis not present

## 2024-02-18 DIAGNOSIS — Z794 Long term (current) use of insulin: Secondary | ICD-10-CM | POA: Diagnosis not present

## 2024-02-18 DIAGNOSIS — E1165 Type 2 diabetes mellitus with hyperglycemia: Secondary | ICD-10-CM | POA: Diagnosis not present

## 2024-02-18 DIAGNOSIS — Z87891 Personal history of nicotine dependence: Secondary | ICD-10-CM | POA: Diagnosis not present

## 2024-02-18 DIAGNOSIS — Z6841 Body Mass Index (BMI) 40.0 and over, adult: Secondary | ICD-10-CM | POA: Diagnosis not present

## 2024-02-18 DIAGNOSIS — I679 Cerebrovascular disease, unspecified: Secondary | ICD-10-CM | POA: Diagnosis not present

## 2024-02-18 DIAGNOSIS — E1122 Type 2 diabetes mellitus with diabetic chronic kidney disease: Secondary | ICD-10-CM | POA: Diagnosis not present

## 2024-02-18 DIAGNOSIS — N182 Chronic kidney disease, stage 2 (mild): Secondary | ICD-10-CM | POA: Diagnosis not present

## 2024-02-18 DIAGNOSIS — J449 Chronic obstructive pulmonary disease, unspecified: Secondary | ICD-10-CM | POA: Diagnosis not present

## 2024-02-18 DIAGNOSIS — Z7982 Long term (current) use of aspirin: Secondary | ICD-10-CM | POA: Diagnosis not present

## 2024-02-20 DIAGNOSIS — R4182 Altered mental status, unspecified: Secondary | ICD-10-CM | POA: Diagnosis not present

## 2024-02-20 DIAGNOSIS — E041 Nontoxic single thyroid nodule: Secondary | ICD-10-CM | POA: Diagnosis not present

## 2024-02-20 DIAGNOSIS — Z743 Need for continuous supervision: Secondary | ICD-10-CM | POA: Diagnosis not present

## 2024-02-20 DIAGNOSIS — G459 Transient cerebral ischemic attack, unspecified: Secondary | ICD-10-CM | POA: Diagnosis not present

## 2024-02-20 DIAGNOSIS — J9811 Atelectasis: Secondary | ICD-10-CM | POA: Diagnosis not present

## 2024-02-20 DIAGNOSIS — R404 Transient alteration of awareness: Secondary | ICD-10-CM | POA: Diagnosis not present

## 2024-02-20 DIAGNOSIS — I959 Hypotension, unspecified: Secondary | ICD-10-CM | POA: Diagnosis not present

## 2024-02-20 DIAGNOSIS — E119 Type 2 diabetes mellitus without complications: Secondary | ICD-10-CM | POA: Diagnosis not present

## 2024-02-20 DIAGNOSIS — N179 Acute kidney failure, unspecified: Secondary | ICD-10-CM | POA: Diagnosis not present

## 2024-02-20 DIAGNOSIS — E860 Dehydration: Secondary | ICD-10-CM | POA: Diagnosis not present

## 2024-02-20 DIAGNOSIS — R079 Chest pain, unspecified: Secondary | ICD-10-CM | POA: Diagnosis not present

## 2024-02-20 DIAGNOSIS — Z794 Long term (current) use of insulin: Secondary | ICD-10-CM | POA: Diagnosis not present

## 2024-02-20 DIAGNOSIS — R531 Weakness: Secondary | ICD-10-CM | POA: Diagnosis not present

## 2024-02-20 DIAGNOSIS — R410 Disorientation, unspecified: Secondary | ICD-10-CM | POA: Diagnosis not present

## 2024-02-21 DIAGNOSIS — Z794 Long term (current) use of insulin: Secondary | ICD-10-CM | POA: Diagnosis not present

## 2024-02-21 DIAGNOSIS — R4182 Altered mental status, unspecified: Secondary | ICD-10-CM | POA: Diagnosis not present

## 2024-02-21 DIAGNOSIS — I129 Hypertensive chronic kidney disease with stage 1 through stage 4 chronic kidney disease, or unspecified chronic kidney disease: Secondary | ICD-10-CM | POA: Diagnosis not present

## 2024-02-21 DIAGNOSIS — N189 Chronic kidney disease, unspecified: Secondary | ICD-10-CM | POA: Diagnosis not present

## 2024-02-21 DIAGNOSIS — R531 Weakness: Secondary | ICD-10-CM | POA: Diagnosis not present

## 2024-02-21 DIAGNOSIS — N179 Acute kidney failure, unspecified: Secondary | ICD-10-CM | POA: Diagnosis not present

## 2024-02-21 DIAGNOSIS — E1122 Type 2 diabetes mellitus with diabetic chronic kidney disease: Secondary | ICD-10-CM | POA: Diagnosis not present

## 2024-02-22 DIAGNOSIS — Z794 Long term (current) use of insulin: Secondary | ICD-10-CM | POA: Diagnosis not present

## 2024-02-22 DIAGNOSIS — N179 Acute kidney failure, unspecified: Secondary | ICD-10-CM | POA: Diagnosis not present

## 2024-02-22 DIAGNOSIS — I959 Hypotension, unspecified: Secondary | ICD-10-CM | POA: Diagnosis not present

## 2024-02-22 DIAGNOSIS — R531 Weakness: Secondary | ICD-10-CM | POA: Diagnosis not present

## 2024-02-22 DIAGNOSIS — R4182 Altered mental status, unspecified: Secondary | ICD-10-CM | POA: Diagnosis not present

## 2024-02-22 DIAGNOSIS — E119 Type 2 diabetes mellitus without complications: Secondary | ICD-10-CM | POA: Diagnosis not present

## 2024-02-24 DIAGNOSIS — E1141 Type 2 diabetes mellitus with diabetic mononeuropathy: Secondary | ICD-10-CM | POA: Diagnosis not present

## 2024-02-24 DIAGNOSIS — Z794 Long term (current) use of insulin: Secondary | ICD-10-CM | POA: Diagnosis not present

## 2024-02-24 DIAGNOSIS — I129 Hypertensive chronic kidney disease with stage 1 through stage 4 chronic kidney disease, or unspecified chronic kidney disease: Secondary | ICD-10-CM | POA: Diagnosis not present

## 2024-02-24 DIAGNOSIS — J449 Chronic obstructive pulmonary disease, unspecified: Secondary | ICD-10-CM | POA: Diagnosis not present

## 2024-02-24 DIAGNOSIS — I679 Cerebrovascular disease, unspecified: Secondary | ICD-10-CM | POA: Diagnosis not present

## 2024-02-24 DIAGNOSIS — N182 Chronic kidney disease, stage 2 (mild): Secondary | ICD-10-CM | POA: Diagnosis not present

## 2024-02-24 DIAGNOSIS — E1165 Type 2 diabetes mellitus with hyperglycemia: Secondary | ICD-10-CM | POA: Diagnosis not present

## 2024-02-24 DIAGNOSIS — E1122 Type 2 diabetes mellitus with diabetic chronic kidney disease: Secondary | ICD-10-CM | POA: Diagnosis not present

## 2024-02-24 DIAGNOSIS — Z87891 Personal history of nicotine dependence: Secondary | ICD-10-CM | POA: Diagnosis not present

## 2024-02-24 DIAGNOSIS — Z7982 Long term (current) use of aspirin: Secondary | ICD-10-CM | POA: Diagnosis not present

## 2024-02-24 DIAGNOSIS — Z7902 Long term (current) use of antithrombotics/antiplatelets: Secondary | ICD-10-CM | POA: Diagnosis not present

## 2024-02-24 DIAGNOSIS — Z6841 Body Mass Index (BMI) 40.0 and over, adult: Secondary | ICD-10-CM | POA: Diagnosis not present

## 2024-03-01 DIAGNOSIS — E1141 Type 2 diabetes mellitus with diabetic mononeuropathy: Secondary | ICD-10-CM | POA: Diagnosis not present

## 2024-03-01 DIAGNOSIS — J449 Chronic obstructive pulmonary disease, unspecified: Secondary | ICD-10-CM | POA: Diagnosis not present

## 2024-03-01 DIAGNOSIS — I129 Hypertensive chronic kidney disease with stage 1 through stage 4 chronic kidney disease, or unspecified chronic kidney disease: Secondary | ICD-10-CM | POA: Diagnosis not present

## 2024-03-01 DIAGNOSIS — Z87891 Personal history of nicotine dependence: Secondary | ICD-10-CM | POA: Diagnosis not present

## 2024-03-01 DIAGNOSIS — I679 Cerebrovascular disease, unspecified: Secondary | ICD-10-CM | POA: Diagnosis not present

## 2024-03-01 DIAGNOSIS — E1122 Type 2 diabetes mellitus with diabetic chronic kidney disease: Secondary | ICD-10-CM | POA: Diagnosis not present

## 2024-03-01 DIAGNOSIS — Z6841 Body Mass Index (BMI) 40.0 and over, adult: Secondary | ICD-10-CM | POA: Diagnosis not present

## 2024-03-01 DIAGNOSIS — Z7982 Long term (current) use of aspirin: Secondary | ICD-10-CM | POA: Diagnosis not present

## 2024-03-01 DIAGNOSIS — N182 Chronic kidney disease, stage 2 (mild): Secondary | ICD-10-CM | POA: Diagnosis not present

## 2024-03-01 DIAGNOSIS — E1165 Type 2 diabetes mellitus with hyperglycemia: Secondary | ICD-10-CM | POA: Diagnosis not present

## 2024-03-01 DIAGNOSIS — Z794 Long term (current) use of insulin: Secondary | ICD-10-CM | POA: Diagnosis not present

## 2024-03-01 DIAGNOSIS — Z7902 Long term (current) use of antithrombotics/antiplatelets: Secondary | ICD-10-CM | POA: Diagnosis not present

## 2024-03-02 DIAGNOSIS — Z794 Long term (current) use of insulin: Secondary | ICD-10-CM | POA: Diagnosis not present

## 2024-03-02 DIAGNOSIS — N182 Chronic kidney disease, stage 2 (mild): Secondary | ICD-10-CM | POA: Diagnosis not present

## 2024-03-02 DIAGNOSIS — Z87891 Personal history of nicotine dependence: Secondary | ICD-10-CM | POA: Diagnosis not present

## 2024-03-02 DIAGNOSIS — J449 Chronic obstructive pulmonary disease, unspecified: Secondary | ICD-10-CM | POA: Diagnosis not present

## 2024-03-02 DIAGNOSIS — Z7902 Long term (current) use of antithrombotics/antiplatelets: Secondary | ICD-10-CM | POA: Diagnosis not present

## 2024-03-02 DIAGNOSIS — I129 Hypertensive chronic kidney disease with stage 1 through stage 4 chronic kidney disease, or unspecified chronic kidney disease: Secondary | ICD-10-CM | POA: Diagnosis not present

## 2024-03-02 DIAGNOSIS — Z7982 Long term (current) use of aspirin: Secondary | ICD-10-CM | POA: Diagnosis not present

## 2024-03-02 DIAGNOSIS — E1122 Type 2 diabetes mellitus with diabetic chronic kidney disease: Secondary | ICD-10-CM | POA: Diagnosis not present

## 2024-03-02 DIAGNOSIS — I679 Cerebrovascular disease, unspecified: Secondary | ICD-10-CM | POA: Diagnosis not present

## 2024-03-02 DIAGNOSIS — E1165 Type 2 diabetes mellitus with hyperglycemia: Secondary | ICD-10-CM | POA: Diagnosis not present

## 2024-03-02 DIAGNOSIS — E1141 Type 2 diabetes mellitus with diabetic mononeuropathy: Secondary | ICD-10-CM | POA: Diagnosis not present

## 2024-03-02 DIAGNOSIS — Z6841 Body Mass Index (BMI) 40.0 and over, adult: Secondary | ICD-10-CM | POA: Diagnosis not present

## 2024-03-08 ENCOUNTER — Telehealth: Payer: Self-pay | Admitting: Nurse Practitioner

## 2024-03-08 DIAGNOSIS — Z7902 Long term (current) use of antithrombotics/antiplatelets: Secondary | ICD-10-CM | POA: Diagnosis not present

## 2024-03-08 DIAGNOSIS — E1122 Type 2 diabetes mellitus with diabetic chronic kidney disease: Secondary | ICD-10-CM | POA: Diagnosis not present

## 2024-03-08 DIAGNOSIS — E1141 Type 2 diabetes mellitus with diabetic mononeuropathy: Secondary | ICD-10-CM | POA: Diagnosis not present

## 2024-03-08 DIAGNOSIS — Z794 Long term (current) use of insulin: Secondary | ICD-10-CM | POA: Diagnosis not present

## 2024-03-08 DIAGNOSIS — I129 Hypertensive chronic kidney disease with stage 1 through stage 4 chronic kidney disease, or unspecified chronic kidney disease: Secondary | ICD-10-CM | POA: Diagnosis not present

## 2024-03-08 DIAGNOSIS — I679 Cerebrovascular disease, unspecified: Secondary | ICD-10-CM | POA: Diagnosis not present

## 2024-03-08 DIAGNOSIS — Z7982 Long term (current) use of aspirin: Secondary | ICD-10-CM | POA: Diagnosis not present

## 2024-03-08 DIAGNOSIS — E1165 Type 2 diabetes mellitus with hyperglycemia: Secondary | ICD-10-CM | POA: Diagnosis not present

## 2024-03-08 DIAGNOSIS — N182 Chronic kidney disease, stage 2 (mild): Secondary | ICD-10-CM | POA: Diagnosis not present

## 2024-03-08 DIAGNOSIS — Z87891 Personal history of nicotine dependence: Secondary | ICD-10-CM | POA: Diagnosis not present

## 2024-03-08 DIAGNOSIS — Z6841 Body Mass Index (BMI) 40.0 and over, adult: Secondary | ICD-10-CM | POA: Diagnosis not present

## 2024-03-08 DIAGNOSIS — J449 Chronic obstructive pulmonary disease, unspecified: Secondary | ICD-10-CM | POA: Diagnosis not present

## 2024-03-08 NOTE — Progress Notes (Unsigned)
   OFFICE NOTE Date:  03/08/2024  ID:  [name], DOB Dec 10, 1950, MRN [id number] PCP: Theoplis Fix, MD  [facility] Providers Cardiologist:  Drake Gens, MD (Inactive) { Click to update primary MD,subspecialty MD or APP then REFRESH:1}    Patient Profile:     Coronary artery disease  NSTEMI in 11/2014 s/p 3.5 x 15 mm DES to LCx and staged PCI with 3.5 x 18 mm DES to LAD [facility] 11/30/2014: Proximal LAD 80, mid RCA 20, mid AV groove LCx 99, EF 50 TTE 12/03/2014: EF 50-55 TTE 11/22/2023: Limited images, EF 55-60, no RWMA, GR 1 DD, low normal RVSF, small pericardial effusion, trivial MR Supraventricular Tachycardia  Admx 11/2023 w SVT, tx w Vagal maneuvers; repeat admx 11/2023 tx w Amio (DC'd b/c of low BP) EP eval >> Metoprolol  tartrate 50 mg twice daily  Monitor 01/2024: No AFib, no SVT, no high grade HB or pauses, PVCs/PACs< 1%  Diabetes mellitus  Hypertension  Hyperlipidemia  Chronic Obstructive Pulmonary Disease Tobacco use        Discussed the use of AI scribe software for clinical note transcription with the patient, who gave verbal consent to proceed. History of Present Illness Monica Thompson is a 73 y.o. female who returns for evaluation of hypotension, AFib. She was last seen 12/23/23 by Charles Connor, NP. At that visit, her metoprolol  was reduced to 25 mg twice daily b/c of bradycardia, hypotension. She called in Tuesday 5/27 with symptomatic hypotension. BPs listed: 139/60, 87/48, 90/50, 91/61, 85/62. Notes indicate PCP reduced, then stopped Losartan and decreased Metoprolol  tartrate to 12.5 mg. Also, pt was noted to be in AFib.     ROS-See HPI***    Studies Reviewed:      *** Results  Risk Assessment/Calculations: {Does this patient have ATRIAL FIBRILLATION?:[phone]} No BP recorded.  {Refresh Note OR Click here to enter BP  :1}***      Physical Exam:  VS:  There were no vitals taken for this visit.   Wt Readings from Last 3 Encounters:  12/23/23 (!) 315  lb 6.4 oz (143.1 kg)  11/28/23 (!) 320 lb 1.7 oz (145.2 kg)  11/27/23 (!) 318 lb 2 oz (144.3 kg)    Physical Exam***     Assessment and Plan: Assessment & Plan SVT (supraventricular tachycardia) (HCC)  Coronary artery disease involving native coronary artery of native heart without angina pectoris  Hypotension due to drugs  Assessment and Plan Assessment & Plan    {      :1}    {Are you ordering a CV Procedure (e.g. stress test, cath, DCCV, TEE, etc)?   Press F2        :[id number]}  Dispo:  No follow-ups on file. Signed, Marlyse Single, PA-C

## 2024-03-08 NOTE — Telephone Encounter (Signed)
 EKG was done at PCP on 03/04/24 which they say showed afib.  Losartan was changed to 25 mg on 5/15 due to low BPs.  Then stopped altogether 5/23 so off losartan now and per PCP holding evening dose of metoprolol  today and start back on 12.5 mg twice daily.  They will hold metoprolol  altogether until seen tomorrow.  BPs have been low.  They have concerns re: History of SVT and now stopping metprolol Blockage in back of brain and that is the reason she was prescribed losartan 75 mg.  Adv the HR of 122 may have just been because she was walking and hypotensive.

## 2024-03-08 NOTE — Telephone Encounter (Signed)
 Pt c/o BP issue: STAT if pt c/o blurred vision, one-sided weakness or slurred speech.  STAT if BP is GREATER than 180/120 TODAY.  STAT if BP is LESS than 90/60 and SYMPTOMATIC TODAY  1. What is your BP concern? Hypotension  2. Have you taken any BP medication today? Yes, all readings listed below  3. What are your last 5 BP readings? 03/08/24:  139/60 87/48 HR 65 90/50 HR 61 1:30 PM 92/50 91/61 HR 122  85/62 HR 68  4. Are you having any other symptoms (ex. Dizziness, headache, blurred vision, passed out)?  Lightheaded/dizzy when BP runs low. C/o of these symptoms today   Patient fell last week, was fine when BP was checked after the fall. PCP advised today to skip metoprolol  tonight and decrease to a 1/2 a tablet of 12.5 mg. Daughter also reports EKG with PCP last week showed a-fib. Please advise.

## 2024-03-09 ENCOUNTER — Other Ambulatory Visit: Payer: Self-pay | Admitting: *Deleted

## 2024-03-09 ENCOUNTER — Telehealth: Payer: Self-pay

## 2024-03-09 ENCOUNTER — Ambulatory Visit: Attending: Physician Assistant | Admitting: Cardiology

## 2024-03-09 ENCOUNTER — Encounter: Payer: Self-pay | Admitting: Cardiology

## 2024-03-09 VITALS — BP 104/62 | HR 64 | Ht 64.0 in | Wt 300.4 lb

## 2024-03-09 DIAGNOSIS — I952 Hypotension due to drugs: Secondary | ICD-10-CM | POA: Diagnosis not present

## 2024-03-09 DIAGNOSIS — I471 Supraventricular tachycardia, unspecified: Secondary | ICD-10-CM

## 2024-03-09 DIAGNOSIS — I2510 Atherosclerotic heart disease of native coronary artery without angina pectoris: Secondary | ICD-10-CM

## 2024-03-09 DIAGNOSIS — Z6841 Body Mass Index (BMI) 40.0 and over, adult: Secondary | ICD-10-CM

## 2024-03-09 DIAGNOSIS — Z794 Long term (current) use of insulin: Secondary | ICD-10-CM | POA: Diagnosis not present

## 2024-03-09 DIAGNOSIS — Z79899 Other long term (current) drug therapy: Secondary | ICD-10-CM

## 2024-03-09 DIAGNOSIS — I951 Orthostatic hypotension: Secondary | ICD-10-CM

## 2024-03-09 DIAGNOSIS — E119 Type 2 diabetes mellitus without complications: Secondary | ICD-10-CM | POA: Diagnosis not present

## 2024-03-09 NOTE — Progress Notes (Signed)
 Cardiology Office Note:  .   Date:  03/09/2024  ID:  Monica Thompson, DOB Dec 07, 1950, MRN [id number] PCP: Theoplis Fix, MD  [facility] Providers Cardiologist:  Lasalle Pointer, MD {  History of Present Illness: .   Monica Thompson is a 73 y.o. female with CAD status post DES to LCx/LAD 2016, SVT, insulin -dependent diabetes, CVA, hypertension, hyperlipidemia, COPD, nicotine  dependence     Coronary artery disease  NSTEMI in 11/2014 s/p 3.5 x 15 mm DES to LCx and staged PCI with 3.5 x 18 mm DES to LAD [facility] 11/30/2014: Proximal LAD 80, mid RCA 20, mid AV groove LCx 99, EF 50 TTE 12/03/2014: EF 50-55 TTE 11/22/2023: Limited images, EF 55-60, no RWMA, GR 1 DD, low normal RVSF, small pericardial effusion, trivial MR Supraventricular Tachycardia  Admx 11/2023 w SVT, tx w Vagal maneuvers; repeat admx 11/2023 tx w IV Amio (DC'd b/c of low BP) EP eval >> Metoprolol  tartrate 50 mg twice daily  Monitor 01/2024: No AFib, no SVT, no high grade HB or pauses, PVCs/PACs< 1% 3.  CVA  A.  At [facility], March 2025 questionable left cerebral stroke, presentation felt likely related to chronic conditions rather than focal deficits.  Symptoms did not correspond to infarct.  Started on aspirin  and Plavix.  B.  Moderate to severe stenosis of left MCA, distal left ACA      We have primarily been following patient now for SVT/hypotension.  Patient last seen March 2025, metoprolol  was reduced to 25 mg twice daily b/c of bradycardia, hypotension.  Per Dr. Lawana Pray, if she were to have recurrent symptoms could consider ablation but BMI would have to be less than 40.  She called in Tuesday 5/27 with symptomatic hypotension. BPs listed: 139/60, 87/48, 90/50, 91/61, 85/62. Notes indicate PCP reduced, then stopped Losartan and decreased Metoprolol  tartrate to 12.5 mg.    Then patient with admission May 2025 reportedly with acute transient confusion and hypotension, reportedly minimally verbal.  Blood pressures in the 80s  and 70s systolic.  Workup overall unremarkable but found to have AKI with elevated lactic.  Given fluids.  Presentation felt related to hypotension exacerbated by dehydration.  Losartan was reinitiated after patient started becoming hypertensive but at lower dose.  Additionally there has been some mention of atrial fibrillation that seems to be reported from the patient that was reported by nurse at last PCPs office visit May 23, EKG was faxed over here, reviewed, this is sinus bradycardia.   Today she presents for follow-up she is accompanied with her daughter who is very diligent about her care and cares for her very closely.  She reports that she is very worried about her mother's current state as she has had multiple episodes where she has had low blood pressures based off of wrist cuff, and elevated heart rates.  However heart rates and measurements are conflicting and of questionable accuracy.  EKG in the office had shown heart rate of 50s, her cuff had shown heart rates in the 150s and systolics of 75/55.  She brings in the blood pressure cuff measurements and generally has soft but reasonable pressures but some extraneous values that are severely hypotensive in the 70s.  Patient's daughter very emotional and tearful during exam as she feels overwhelmed with everything and feels she has seen multiple providers with varying opinions/plan.  Reports that she saw somebody at [facility] who had ordered heart monitor which they have already sent back and are awaiting results.  Patient's daughter also reporting that she has been restricting salt intake significantly as she thought this would help.  Daughter also reports that she has stopped losartan and metoprolol  altogether since Friday.   ROS: Denies: Chest pain, shortness of breath, orthopnea, peripheral edema, palpitations, decreased exercise intolerance Studies Reviewed: Aaron Aas    EKG Interpretation Date/Time:  Wednesday Mar 09 2024 15:09:34  EDT Ventricular Rate:  64 PR Interval:  192 QRS Duration:  96 QT Interval:  444 QTC Calculation: 458 R Axis:   24  Text Interpretation: Artifact noted  but appears to be regular Normal sinus rhythm Low voltage QRS Cannot rule out Anterior infarct , age undetermined When compared with ECG of 28-Nov-2023 06:23, PREVIOUS ECG IS PRESENT Confirmed by Morgan Arab [phone]) on 03/09/2024 4:47:19 PM    Risk Assessment/Calculations:             Physical Exam:   VS:  BP 104/62   Pulse 64   Ht 5\' 4"  (1.626 m)   Wt (!) 300 lb 6.4 oz (136.3 kg)   SpO2 94%   BMI 51.56 kg/m    Wt Readings from Last 3 Encounters:  03/09/24 (!) 300 lb 6.4 oz (136.3 kg)  12/23/23 (!) 315 lb 6.4 oz (143.1 kg)  11/28/23 (!) 320 lb 1.7 oz (145.2 kg)    GEN: Well nourished, well developed in no acute distress NECK: No JVD; No carotid bruits CARDIAC: RRR, no murmurs, rubs, gallops RESPIRATORY:  Clear to auscultation without rales, wheezing or rhonchi  ABDOMEN: Soft, non-tender, non-distended EXTREMITIES:  No edema; No deformity   ASSESSMENT AND PLAN: .    SVT Admission in February 2025.  Heart monitor in March 2025 showing no evidence of significant arrhythmias or ectopy.  Daughter reports hypotension and tachycardia although values are of questionable accuracy.  Reportedly primary care provider at [facility] had ordered 14-day heart monitor which is pending at this time.  Due to baseline bradycardia and hypotension beta-blocker has gradually been reduced and intermittently stopped. If vagal maneuvers unsuccessful, can take as needed dose of Lopressor  25 mg.  With baseline bradycardia/hypotension we will not prescribe this daily. TSH normal within the last 3 months. Has pending heart monitor, depending on this if she has recurrent episodes in the setting of hypotension and burden is elevated may need to refer to EP for consideration of ablation although again tachycardia is of questionable accuracy.  Additionally  BMI would have to be less than 40 per Dr. Lawana Pray to consider.  Orthostatic hypotension I think this is her primary issue, as above her tachycardia and hypotension are difficult to accurately assess given measurements and data is from wrist cuff.  Daughter had been holding salt intake. Orthostatics here are borderline positive.  POTS not likely given drop in blood pressure. I have encouraged aggressive salt, water, fluid intake, exercise, slow positional changes.  Compression stockings. Continue to hold her losartan and beta-blocker.  Continue to try to obtain as accurate measurements as possible.  CAD status post DES to LCx/LAD 2016 No anginal symptoms, overall seems to be stable. Continue with aspirin , atorvastatin  40 mg.  Will try to obtain lipid panel however blood draw has been difficult to obtain from patient.  She will try tomorrow.  No atrial fibrillation This has been self-reported by the patient's daughter, review of EKG from 5/23 does not show atrial fibrillation, sinus bradycardia.  Additionally today reviewed by myself and DOD.  Appears to be regular and likely sinus with some artifact.  Nicotine   dependence Did not have time to address this, reassess at follow-up  CVA 12/2023 Follows with neurology, started on aspirin  and Plavix per neurology.  Morbid obesity Strongly encouraged weight loss as this will also help with her autonomic dysfunction and overall quality of life.  She is down about 20 pounds since February.  Congratulated patient.  Today is 300lbs.  Insulin -dependent diabetes A1c is uncontrolled, most recently 10.4% in February 2025, this along with her obesity makes her a good candidate for Ozempic as patient's daughter had questions.     Dispo: She will follow-up with [place] MD in September.  I have asked her to send monitor results to us  once that results.  Follow-up on orthostatic hypotension.  Signed, Burnetta Cart, PA-C

## 2024-03-09 NOTE — Progress Notes (Signed)
   03/09/2024 Name: Monica Thompson MRN: [id number] DOB: [birth year]  No chief complaint on file.   Monica Thompson is a 73 y.o. year old female who presented for a telephone visit.   They were referred to the pharmacist by their Case Management Team  for assistance in managing medication access.    Subjective:  Care Team: Primary Care Provider: Theoplis Fix, MD ; Next Scheduled Visit: N/A   Medication Access/Adherence  Current Pharmacy:  [facility] - [place], [place] - [address] [place] [address] Phone: [phone] Fax: [phone]  [facility] [address] [place] - [address] [place] [address] [place] [address] [place] [address] Phone: [phone] Fax: [phone]   The patient was not seen by their PCP in early May due to a hospitalization related to hypotension, weakness, dehydration, and supraventricular tachycardia (SVT).  On 03/04/2024, I contacted [facility] to confirm the cost of the newly prescribed Ozempic 0.25 mg weekly. According to pharmacy staff, Ozempic is priced at $12.15 for a 56-day supply. Although this appears to be a two-month quantity, it is billed that way due to the initial starting dose (per pharmacy staff).  In addition, neither Basaglar  nor any other insulin  has been filled. Novolog  is not on file. If a basal insulin  were to be filled, the cost would be $12.15 for a 30-day supply.  I spoke with the patient's daughter, Monica Thompson, who was attending a doctor's appointment with Monica Thompson at the time. She appeared satisfied with the cost of Ozempic. I explained that the reduced medication costs might be due to approval for the Low-Income Subsidy (LIS) / Extra Help program, based on the timing of the application. However, Monica Thompson reported that they have not yet received an approval letter.  Assessment/Plan:  Given the reported medication costs, it is likely the patient has been approved for LIS/Extra  Help. Moving forward, her medications should be affordable. A follow-up will be conducted in 5-7 days to determine if there are any additional needs related to this referral. If no further needs are identified, the referral will be closed from a medication assistance standpoint.   Alexandria Angel, PharmD Clinical Pharmacist Cell: [phone]

## 2024-03-09 NOTE — Patient Instructions (Addendum)
 Medication Instructions:  Your physician has recommended you make the following change in your medication:   STOP Losartan  CHANGE the Lopressor  25 to only as needed for heart rate over 130  *If you need a refill on your cardiac medications before your next appointment, please call your pharmacy*  Lab Work: GO TO A [facility] NEAR YOU FASTING FOR:  LIPID  If you have labs (blood work) drawn today and your tests are completely normal, you will receive your results only by: MyChart Message (if you have MyChart) OR A paper copy in the mail If you have any lab test that is abnormal or we need to change your treatment, we will call you to review the results.  Testing/Procedures: None ordered today.. WHEN [facility] CALLS YOU WITH YOUR MONITOR RESULTS, PLEASE HAVE THEM FAX TO SHENG HALEY, PA, TO [phone]  Follow-Up: At [facility], you and your health needs are our priority.  As part of our continuing mission to provide you with exceptional heart care, our providers are all part of one team.  This team includes your primary Cardiologist (physician) and Advanced Practice Providers or APPs (Physician Assistants and Nurse Practitioners) who all work together to provide you with the care you need, when you need it.  Your next appointment:   As scheduled   Provider:   Vishnu Mallipeddi, MD    We recommend signing up for the patient portal called "MyChart".  Sign up information is provided on this After Visit Summary.  MyChart is used to connect with patients for Virtual Visits (Telemedicine).  Patients are able to view lab/test results, encounter notes, upcoming appointments, etc.  Non-urgent messages can be sent to your provider as well.   To learn more about what you can do with MyChart, go to [email or web address].   Other Instructions You  need to increase your Salt intake, Fluid Intake (preferably water) From a Cardiac standpoint, you are able to take a weight loss  medication  You need to increase your exercise  Go to [facility] and get fitted for the compression stockings

## 2024-03-10 DIAGNOSIS — Z7902 Long term (current) use of antithrombotics/antiplatelets: Secondary | ICD-10-CM | POA: Diagnosis not present

## 2024-03-10 DIAGNOSIS — Z6841 Body Mass Index (BMI) 40.0 and over, adult: Secondary | ICD-10-CM | POA: Diagnosis not present

## 2024-03-10 DIAGNOSIS — E1165 Type 2 diabetes mellitus with hyperglycemia: Secondary | ICD-10-CM | POA: Diagnosis not present

## 2024-03-10 DIAGNOSIS — Z794 Long term (current) use of insulin: Secondary | ICD-10-CM | POA: Diagnosis not present

## 2024-03-10 DIAGNOSIS — E1122 Type 2 diabetes mellitus with diabetic chronic kidney disease: Secondary | ICD-10-CM | POA: Diagnosis not present

## 2024-03-10 DIAGNOSIS — E1141 Type 2 diabetes mellitus with diabetic mononeuropathy: Secondary | ICD-10-CM | POA: Diagnosis not present

## 2024-03-10 DIAGNOSIS — Z87891 Personal history of nicotine dependence: Secondary | ICD-10-CM | POA: Diagnosis not present

## 2024-03-10 DIAGNOSIS — I129 Hypertensive chronic kidney disease with stage 1 through stage 4 chronic kidney disease, or unspecified chronic kidney disease: Secondary | ICD-10-CM | POA: Diagnosis not present

## 2024-03-10 DIAGNOSIS — I679 Cerebrovascular disease, unspecified: Secondary | ICD-10-CM | POA: Diagnosis not present

## 2024-03-10 DIAGNOSIS — J449 Chronic obstructive pulmonary disease, unspecified: Secondary | ICD-10-CM | POA: Diagnosis not present

## 2024-03-10 DIAGNOSIS — Z7982 Long term (current) use of aspirin: Secondary | ICD-10-CM | POA: Diagnosis not present

## 2024-03-10 DIAGNOSIS — N182 Chronic kidney disease, stage 2 (mild): Secondary | ICD-10-CM | POA: Diagnosis not present

## 2024-03-15 DIAGNOSIS — E1122 Type 2 diabetes mellitus with diabetic chronic kidney disease: Secondary | ICD-10-CM | POA: Diagnosis not present

## 2024-03-15 DIAGNOSIS — J449 Chronic obstructive pulmonary disease, unspecified: Secondary | ICD-10-CM | POA: Diagnosis not present

## 2024-03-15 DIAGNOSIS — Z794 Long term (current) use of insulin: Secondary | ICD-10-CM | POA: Diagnosis not present

## 2024-03-15 DIAGNOSIS — I679 Cerebrovascular disease, unspecified: Secondary | ICD-10-CM | POA: Diagnosis not present

## 2024-03-15 DIAGNOSIS — Z7902 Long term (current) use of antithrombotics/antiplatelets: Secondary | ICD-10-CM | POA: Diagnosis not present

## 2024-03-15 DIAGNOSIS — Z6841 Body Mass Index (BMI) 40.0 and over, adult: Secondary | ICD-10-CM | POA: Diagnosis not present

## 2024-03-15 DIAGNOSIS — E1165 Type 2 diabetes mellitus with hyperglycemia: Secondary | ICD-10-CM | POA: Diagnosis not present

## 2024-03-15 DIAGNOSIS — E1141 Type 2 diabetes mellitus with diabetic mononeuropathy: Secondary | ICD-10-CM | POA: Diagnosis not present

## 2024-03-15 DIAGNOSIS — Z87891 Personal history of nicotine dependence: Secondary | ICD-10-CM | POA: Diagnosis not present

## 2024-03-15 DIAGNOSIS — I2510 Atherosclerotic heart disease of native coronary artery without angina pectoris: Secondary | ICD-10-CM | POA: Diagnosis not present

## 2024-03-15 DIAGNOSIS — I129 Hypertensive chronic kidney disease with stage 1 through stage 4 chronic kidney disease, or unspecified chronic kidney disease: Secondary | ICD-10-CM | POA: Diagnosis not present

## 2024-03-15 DIAGNOSIS — N182 Chronic kidney disease, stage 2 (mild): Secondary | ICD-10-CM | POA: Diagnosis not present

## 2024-03-15 DIAGNOSIS — Z7982 Long term (current) use of aspirin: Secondary | ICD-10-CM | POA: Diagnosis not present

## 2024-03-15 DIAGNOSIS — I952 Hypotension due to drugs: Secondary | ICD-10-CM | POA: Diagnosis not present

## 2024-03-15 DIAGNOSIS — I471 Supraventricular tachycardia, unspecified: Secondary | ICD-10-CM | POA: Diagnosis not present

## 2024-03-16 DIAGNOSIS — Z7982 Long term (current) use of aspirin: Secondary | ICD-10-CM | POA: Diagnosis not present

## 2024-03-16 DIAGNOSIS — E1165 Type 2 diabetes mellitus with hyperglycemia: Secondary | ICD-10-CM | POA: Diagnosis not present

## 2024-03-16 DIAGNOSIS — Z7902 Long term (current) use of antithrombotics/antiplatelets: Secondary | ICD-10-CM | POA: Diagnosis not present

## 2024-03-16 DIAGNOSIS — J449 Chronic obstructive pulmonary disease, unspecified: Secondary | ICD-10-CM | POA: Diagnosis not present

## 2024-03-16 DIAGNOSIS — I679 Cerebrovascular disease, unspecified: Secondary | ICD-10-CM | POA: Diagnosis not present

## 2024-03-16 DIAGNOSIS — I129 Hypertensive chronic kidney disease with stage 1 through stage 4 chronic kidney disease, or unspecified chronic kidney disease: Secondary | ICD-10-CM | POA: Diagnosis not present

## 2024-03-16 DIAGNOSIS — Z794 Long term (current) use of insulin: Secondary | ICD-10-CM | POA: Diagnosis not present

## 2024-03-16 DIAGNOSIS — E1122 Type 2 diabetes mellitus with diabetic chronic kidney disease: Secondary | ICD-10-CM | POA: Diagnosis not present

## 2024-03-16 DIAGNOSIS — Z87891 Personal history of nicotine dependence: Secondary | ICD-10-CM | POA: Diagnosis not present

## 2024-03-16 DIAGNOSIS — N182 Chronic kidney disease, stage 2 (mild): Secondary | ICD-10-CM | POA: Diagnosis not present

## 2024-03-16 DIAGNOSIS — E1141 Type 2 diabetes mellitus with diabetic mononeuropathy: Secondary | ICD-10-CM | POA: Diagnosis not present

## 2024-03-16 DIAGNOSIS — Z6841 Body Mass Index (BMI) 40.0 and over, adult: Secondary | ICD-10-CM | POA: Diagnosis not present

## 2024-03-16 LAB — LIPID PANEL
Chol/HDL Ratio: 4.1 ratio (ref 0.0–4.4)
Cholesterol, Total: 154 mg/dL (ref 100–199)
HDL: 38 mg/dL — ABNORMAL LOW (ref >39)
LDL Chol Calc (NIH): 77 mg/dL (ref 0–99)
Triglycerides: 236 mg/dL — ABNORMAL HIGH (ref 0–149)
VLDL Cholesterol Cal: 39 mg/dL (ref 5–40)

## 2024-03-18 DIAGNOSIS — I471 Supraventricular tachycardia, unspecified: Secondary | ICD-10-CM | POA: Diagnosis not present

## 2024-03-22 DIAGNOSIS — Z7982 Long term (current) use of aspirin: Secondary | ICD-10-CM | POA: Diagnosis not present

## 2024-03-22 DIAGNOSIS — I679 Cerebrovascular disease, unspecified: Secondary | ICD-10-CM | POA: Diagnosis not present

## 2024-03-22 DIAGNOSIS — J449 Chronic obstructive pulmonary disease, unspecified: Secondary | ICD-10-CM | POA: Diagnosis not present

## 2024-03-22 DIAGNOSIS — Z87891 Personal history of nicotine dependence: Secondary | ICD-10-CM | POA: Diagnosis not present

## 2024-03-22 DIAGNOSIS — N182 Chronic kidney disease, stage 2 (mild): Secondary | ICD-10-CM | POA: Diagnosis not present

## 2024-03-22 DIAGNOSIS — Z7902 Long term (current) use of antithrombotics/antiplatelets: Secondary | ICD-10-CM | POA: Diagnosis not present

## 2024-03-22 DIAGNOSIS — E1141 Type 2 diabetes mellitus with diabetic mononeuropathy: Secondary | ICD-10-CM | POA: Diagnosis not present

## 2024-03-22 DIAGNOSIS — Z794 Long term (current) use of insulin: Secondary | ICD-10-CM | POA: Diagnosis not present

## 2024-03-22 DIAGNOSIS — Z6841 Body Mass Index (BMI) 40.0 and over, adult: Secondary | ICD-10-CM | POA: Diagnosis not present

## 2024-03-22 DIAGNOSIS — E1165 Type 2 diabetes mellitus with hyperglycemia: Secondary | ICD-10-CM | POA: Diagnosis not present

## 2024-03-22 DIAGNOSIS — I129 Hypertensive chronic kidney disease with stage 1 through stage 4 chronic kidney disease, or unspecified chronic kidney disease: Secondary | ICD-10-CM | POA: Diagnosis not present

## 2024-03-22 DIAGNOSIS — E1122 Type 2 diabetes mellitus with diabetic chronic kidney disease: Secondary | ICD-10-CM | POA: Diagnosis not present

## 2024-03-25 ENCOUNTER — Other Ambulatory Visit (HOSPITAL_COMMUNITY): Payer: Self-pay

## 2024-03-25 ENCOUNTER — Ambulatory Visit: Payer: Self-pay | Admitting: Cardiology

## 2024-03-25 DIAGNOSIS — E782 Mixed hyperlipidemia: Secondary | ICD-10-CM

## 2024-03-25 MED ORDER — ATORVASTATIN CALCIUM 80 MG PO TABS
80.0000 mg | ORAL_TABLET | Freq: Every day | ORAL | 3 refills | Status: DC
  Filled 2024-03-25: qty 90, 90d supply, fill #0

## 2024-03-28 DIAGNOSIS — Z794 Long term (current) use of insulin: Secondary | ICD-10-CM | POA: Diagnosis not present

## 2024-03-28 DIAGNOSIS — E1165 Type 2 diabetes mellitus with hyperglycemia: Secondary | ICD-10-CM | POA: Diagnosis not present

## 2024-03-28 DIAGNOSIS — I2584 Coronary atherosclerosis due to calcified coronary lesion: Secondary | ICD-10-CM | POA: Diagnosis not present

## 2024-03-28 DIAGNOSIS — I1310 Hypertensive heart and chronic kidney disease without heart failure, with stage 1 through stage 4 chronic kidney disease, or unspecified chronic kidney disease: Secondary | ICD-10-CM | POA: Diagnosis not present

## 2024-03-28 DIAGNOSIS — Z6841 Body Mass Index (BMI) 40.0 and over, adult: Secondary | ICD-10-CM | POA: Diagnosis not present

## 2024-03-28 DIAGNOSIS — J449 Chronic obstructive pulmonary disease, unspecified: Secondary | ICD-10-CM | POA: Diagnosis not present

## 2024-03-28 DIAGNOSIS — E1141 Type 2 diabetes mellitus with diabetic mononeuropathy: Secondary | ICD-10-CM | POA: Diagnosis not present

## 2024-03-28 DIAGNOSIS — Z8673 Personal history of transient ischemic attack (TIA), and cerebral infarction without residual deficits: Secondary | ICD-10-CM | POA: Diagnosis not present

## 2024-03-28 DIAGNOSIS — I2510 Atherosclerotic heart disease of native coronary artery without angina pectoris: Secondary | ICD-10-CM | POA: Diagnosis not present

## 2024-03-28 DIAGNOSIS — Z7902 Long term (current) use of antithrombotics/antiplatelets: Secondary | ICD-10-CM | POA: Diagnosis not present

## 2024-03-28 DIAGNOSIS — Z7982 Long term (current) use of aspirin: Secondary | ICD-10-CM | POA: Diagnosis not present

## 2024-03-28 DIAGNOSIS — E1122 Type 2 diabetes mellitus with diabetic chronic kidney disease: Secondary | ICD-10-CM | POA: Diagnosis not present

## 2024-03-28 DIAGNOSIS — Z7985 Long-term (current) use of injectable non-insulin antidiabetic drugs: Secondary | ICD-10-CM | POA: Diagnosis not present

## 2024-03-28 DIAGNOSIS — R001 Bradycardia, unspecified: Secondary | ICD-10-CM | POA: Diagnosis not present

## 2024-03-28 DIAGNOSIS — Z87891 Personal history of nicotine dependence: Secondary | ICD-10-CM | POA: Diagnosis not present

## 2024-03-28 DIAGNOSIS — N182 Chronic kidney disease, stage 2 (mild): Secondary | ICD-10-CM | POA: Diagnosis not present

## 2024-03-28 MED ORDER — ATORVASTATIN CALCIUM 80 MG PO TABS: 80.0000 mg | ORAL_TABLET | Freq: Every day | ORAL | 3 refills | Status: AC

## 2024-03-30 ENCOUNTER — Telehealth: Payer: Self-pay

## 2024-03-30 NOTE — Progress Notes (Signed)
   03/30/2024  Patient ID: Monica Thompson, female   DOB: November 30, 1950, 73 y.o.   MRN: [id number]  Brief Summary:   The patient's daughter remains satisfied with the cost of Ozempic 0.25 mg weekly. The patient has been on the medication for 3 weeks and has 3 pens remaining. I informed the daughter that one pen typically lasts approximately 6 weeks, as the patient is expected to take Ozempic 0.25 mg weekly for the first 4 weeks, followed by 0.5 mg weekly for the next 4 weeks (with the starter pen covering approximately 2 weeks at the 0.5 mg dose).  I advised that if the medication is well tolerated, the dose may be increased. Encouraged Ms. Cunningham to contact the patient's PCP to discuss whether a refill is appropriate or if the dose should be increased to 0.5 mg weekly.  No additional needs noted from family, will close referral.   Alexandria Angel, PharmD Clinical Pharmacist Cell: [phone]

## 2024-04-11 DIAGNOSIS — E1165 Type 2 diabetes mellitus with hyperglycemia: Secondary | ICD-10-CM | POA: Diagnosis not present

## 2024-04-11 DIAGNOSIS — Z7982 Long term (current) use of aspirin: Secondary | ICD-10-CM | POA: Diagnosis not present

## 2024-04-11 DIAGNOSIS — E1141 Type 2 diabetes mellitus with diabetic mononeuropathy: Secondary | ICD-10-CM | POA: Diagnosis not present

## 2024-04-11 DIAGNOSIS — J449 Chronic obstructive pulmonary disease, unspecified: Secondary | ICD-10-CM | POA: Diagnosis not present

## 2024-04-11 DIAGNOSIS — E1122 Type 2 diabetes mellitus with diabetic chronic kidney disease: Secondary | ICD-10-CM | POA: Diagnosis not present

## 2024-04-11 DIAGNOSIS — R001 Bradycardia, unspecified: Secondary | ICD-10-CM | POA: Diagnosis not present

## 2024-04-11 DIAGNOSIS — I2510 Atherosclerotic heart disease of native coronary artery without angina pectoris: Secondary | ICD-10-CM | POA: Diagnosis not present

## 2024-04-11 DIAGNOSIS — N182 Chronic kidney disease, stage 2 (mild): Secondary | ICD-10-CM | POA: Diagnosis not present

## 2024-04-11 DIAGNOSIS — I2584 Coronary atherosclerosis due to calcified coronary lesion: Secondary | ICD-10-CM | POA: Diagnosis not present

## 2024-04-11 DIAGNOSIS — Z6841 Body Mass Index (BMI) 40.0 and over, adult: Secondary | ICD-10-CM | POA: Diagnosis not present

## 2024-04-11 DIAGNOSIS — Z8673 Personal history of transient ischemic attack (TIA), and cerebral infarction without residual deficits: Secondary | ICD-10-CM | POA: Diagnosis not present

## 2024-04-11 DIAGNOSIS — Z87891 Personal history of nicotine dependence: Secondary | ICD-10-CM | POA: Diagnosis not present

## 2024-04-11 DIAGNOSIS — Z7985 Long-term (current) use of injectable non-insulin antidiabetic drugs: Secondary | ICD-10-CM | POA: Diagnosis not present

## 2024-04-11 DIAGNOSIS — Z794 Long term (current) use of insulin: Secondary | ICD-10-CM | POA: Diagnosis not present

## 2024-04-11 DIAGNOSIS — I1310 Hypertensive heart and chronic kidney disease without heart failure, with stage 1 through stage 4 chronic kidney disease, or unspecified chronic kidney disease: Secondary | ICD-10-CM | POA: Diagnosis not present

## 2024-04-11 DIAGNOSIS — Z7902 Long term (current) use of antithrombotics/antiplatelets: Secondary | ICD-10-CM | POA: Diagnosis not present

## 2024-04-27 DIAGNOSIS — Z87891 Personal history of nicotine dependence: Secondary | ICD-10-CM | POA: Diagnosis not present

## 2024-04-27 DIAGNOSIS — Z7985 Long-term (current) use of injectable non-insulin antidiabetic drugs: Secondary | ICD-10-CM | POA: Diagnosis not present

## 2024-04-27 DIAGNOSIS — R001 Bradycardia, unspecified: Secondary | ICD-10-CM | POA: Diagnosis not present

## 2024-04-27 DIAGNOSIS — N182 Chronic kidney disease, stage 2 (mild): Secondary | ICD-10-CM | POA: Diagnosis not present

## 2024-04-27 DIAGNOSIS — Z6841 Body Mass Index (BMI) 40.0 and over, adult: Secondary | ICD-10-CM | POA: Diagnosis not present

## 2024-04-27 DIAGNOSIS — E1122 Type 2 diabetes mellitus with diabetic chronic kidney disease: Secondary | ICD-10-CM | POA: Diagnosis not present

## 2024-04-27 DIAGNOSIS — I2510 Atherosclerotic heart disease of native coronary artery without angina pectoris: Secondary | ICD-10-CM | POA: Diagnosis not present

## 2024-04-27 DIAGNOSIS — I2584 Coronary atherosclerosis due to calcified coronary lesion: Secondary | ICD-10-CM | POA: Diagnosis not present

## 2024-04-27 DIAGNOSIS — Z794 Long term (current) use of insulin: Secondary | ICD-10-CM | POA: Diagnosis not present

## 2024-04-27 DIAGNOSIS — E1141 Type 2 diabetes mellitus with diabetic mononeuropathy: Secondary | ICD-10-CM | POA: Diagnosis not present

## 2024-04-27 DIAGNOSIS — E1165 Type 2 diabetes mellitus with hyperglycemia: Secondary | ICD-10-CM | POA: Diagnosis not present

## 2024-04-27 DIAGNOSIS — Z7902 Long term (current) use of antithrombotics/antiplatelets: Secondary | ICD-10-CM | POA: Diagnosis not present

## 2024-04-27 DIAGNOSIS — Z7982 Long term (current) use of aspirin: Secondary | ICD-10-CM | POA: Diagnosis not present

## 2024-04-27 DIAGNOSIS — Z8673 Personal history of transient ischemic attack (TIA), and cerebral infarction without residual deficits: Secondary | ICD-10-CM | POA: Diagnosis not present

## 2024-04-27 DIAGNOSIS — J449 Chronic obstructive pulmonary disease, unspecified: Secondary | ICD-10-CM | POA: Diagnosis not present

## 2024-04-27 DIAGNOSIS — I1310 Hypertensive heart and chronic kidney disease without heart failure, with stage 1 through stage 4 chronic kidney disease, or unspecified chronic kidney disease: Secondary | ICD-10-CM | POA: Diagnosis not present

## 2024-07-08 ENCOUNTER — Ambulatory Visit: Admitting: Internal Medicine

## 2024-08-01 ENCOUNTER — Encounter: Payer: Self-pay | Admitting: Family Medicine

## 2024-08-01 ENCOUNTER — Ambulatory Visit: Admitting: Family Medicine

## 2024-08-01 VITALS — BP 94/73 | HR 94 | Ht 64.0 in | Wt 293.6 lb

## 2024-08-01 DIAGNOSIS — Z794 Long term (current) use of insulin: Secondary | ICD-10-CM | POA: Diagnosis not present

## 2024-08-01 DIAGNOSIS — E119 Type 2 diabetes mellitus without complications: Secondary | ICD-10-CM | POA: Diagnosis not present

## 2024-08-01 DIAGNOSIS — Z7689 Persons encountering health services in other specified circumstances: Secondary | ICD-10-CM

## 2024-08-01 DIAGNOSIS — E1165 Type 2 diabetes mellitus with hyperglycemia: Secondary | ICD-10-CM | POA: Diagnosis not present

## 2024-08-01 DIAGNOSIS — E538 Deficiency of other specified B group vitamins: Secondary | ICD-10-CM | POA: Insufficient documentation

## 2024-08-01 DIAGNOSIS — E559 Vitamin D deficiency, unspecified: Secondary | ICD-10-CM | POA: Diagnosis not present

## 2024-08-01 LAB — POCT GLYCOSYLATED HEMOGLOBIN (HGB A1C): HbA1c, POC (controlled diabetic range): 7.6 % — AB (ref 0.0–7.0)

## 2024-08-01 MED ORDER — NYSTATIN 100000 UNIT/GM EX POWD: 1.0000 | Freq: Two times a day (BID) | CUTANEOUS | 3 refills | Status: AC

## 2024-08-01 MED ORDER — SEMAGLUTIDE (1 MG/DOSE) 4 MG/3ML ~~LOC~~ SOPN: 1.0000 mg | PEN_INJECTOR | SUBCUTANEOUS | 0 refills | Status: DC

## 2024-08-01 MED ORDER — FREESTYLE LIBRE 3 PLUS SENSOR MISC: 3 refills | Status: DC

## 2024-08-01 NOTE — Assessment & Plan Note (Signed)
 Will recheck level today.

## 2024-08-01 NOTE — Progress Notes (Signed)
 New Patient Office Visit  Subjective    Patient ID: Monica Thompson, female    DOB: August 27, 1951  Age: 73 y.o. MRN: [id number]  CC: establish care  HPI Monica Thompson presents to establish care No concerns today  Outpatient Encounter Medications as of 08/01/2024  Medication Sig   cholecalciferol (VITAMIN D3) 25 MCG (1000 UNIT) tablet Take 1,000 Units by mouth daily.   Continuous Glucose Sensor (FREESTYLE LIBRE 3 PLUS SENSOR) MISC Change sensor every 15 days.   cyanocobalamin (VITAMIN B12) 1000 MCG tablet Take 1,000 mcg by mouth daily.   ergocalciferol (VITAMIN D2) 1.25 MG (50000 UT) capsule Take 50,000 Units by mouth once a week.   Insulin  Glargine (BASAGLAR  KWIKPEN) 100 UNIT/ML Inject 20-40 Units into the skin daily.   Semaglutide, 1 MG/DOSE, 4 MG/3ML SOPN Inject 1 mg into the skin once a week.   [DISCONTINUED] Semaglutide,0.25 or 0.5MG /DOS, (OZEMPIC, 0.25 OR 0.5 MG/DOSE,) 2 MG/1.5ML SOPN Inject 0.5 mg into the skin once a week.   aspirin  EC 81 MG tablet Take 81 mg by mouth daily. Swallow whole.   atorvastatin  (LIPITOR ) 80 MG tablet Take 1 tablet (80 mg total) by mouth daily.   Blood Pressure Monitoring (BLOOD PRESSURE CUFF) MISC 1 each by Does not apply route daily.   Continuous Glucose Sensor (FREESTYLE LIBRE 14 DAY SENSOR) MISC SMARTSIG:1 Each Topical Every 2 Weeks   gabapentin  (NEURONTIN ) 400 MG capsule Take 400 mg by mouth 2 (two) times daily.   metoprolol  tartrate (LOPRESSOR ) 25 MG tablet Take 1 tablet (25 mg total) by mouth 2 (two) times daily.   NOVOLOG  FLEXPEN 100 UNIT/ML FlexPen SMARTSIG:2-10 Unit(s) SUB-Q 4 Times Daily PRN   nystatin (MYCOSTATIN/NYSTOP) powder Apply 1 Application topically 2 (two) times daily.   ondansetron  (ZOFRAN ) 4 MG tablet Take 1 tablet (4 mg total) by mouth every 8 (eight) hours as needed for nausea or vomiting.   pantoprazole  (PROTONIX ) 40 MG tablet Take 40 mg by mouth daily.   [DISCONTINUED] clopidogrel (PLAVIX) 75 MG tablet Take 75 mg by mouth  daily.   [DISCONTINUED] insulin  glargine-yfgn (SEMGLEE ) 100 UNIT/ML Pen Inject 18 Units into the skin at bedtime.   [DISCONTINUED] nystatin (MYCOSTATIN/NYSTOP) powder Apply 1 Application topically 2 (two) times daily.   No facility-administered encounter medications on file as of 08/01/2024.    Past Medical History:  Diagnosis Date   Acid reflux    Arthritis    CAD S/P percutaneous coronary angioplasty 11/13/2014   a. 11/2014 NSTEMI/PCI: LM nl, LAD 80p (3.5x18 Resolute DES), LCX 40p, 64m (3.5x15 Resolute DES), RCA 38m, EF 50%.   Cholecystitis, acute with cholelithiasis 09/10/2013   COPD (chronic obstructive pulmonary disease) (HCC)    Essential hypertension    Hyperlipidemia    Non-ST elevation (NSTEMI) myocardial infarction (HCC) 11/13/2014   NSTEMI (non-ST elevated myocardial infarction) (HCC) 11/30/2014   NSTEMI, initial episode of care (HCC) 11/30/2014   PONV (postoperative nausea and vomiting)    Sleep apnea    Tobacco abuse    Type 2 diabetes mellitus (HCC)     Past Surgical History:  Procedure Laterality Date   ABDOMINAL HYSTERECTOMY     CHOLECYSTECTOMY N/A 09/12/2013   Procedure: LAPAROSCOPIC CHOLECYSTECTOMY;  Surgeon: Oneil DELENA Budge, MD;  Location: [facility];  Service: General;  Laterality: N/A;   EYE SURGERY     LEFT HEART CATHETERIZATION WITH CORONARY ANGIOGRAM N/A 11/30/2014   Procedure: LEFT HEART CATHETERIZATION WITH CORONARY ANGIOGRAM;  Surgeon: Debby DELENA Sor, MD;  Location: [facility] CATH LAB;  Service: Cardiovascular;  Laterality: N/A;   PERCUTANEOUS CORONARY STENT INTERVENTION (PCI-S) N/A 12/04/2014   Procedure: PERCUTANEOUS CORONARY STENT INTERVENTION (PCI-S);  Surgeon: Debby DELENA Sor, MD;  Location[facility] C[facility] LAB;  Service: Cardiovascular;  Laterality: N/A;   TONSILLECTOMY      Family History  Problem Relation Age of Onset   Heart attack Mother    Cancer Father    Deep vein thrombosis Father     Social History   Socioeconomic History   Marital status: Single     Spouse name: Not on file   Number of children: Not on file   Years of education: Not on file   Highest education level: Not on file  Occupational History   Not on file  Tobacco Use   Smoking status: Former    Current packs/day: 0.00    Types: Cigarettes    Quit date: 11/29/2014    Years since quitting: 9.6   Smokeless tobacco: Never  Vaping Use   Vaping status: Never Used  Substance and Sexual Activity   Alcohol  use: No    Alcohol /week: 0.0 standard drinks of alcohol    Drug use: No   Sexual activity: Not on file  Other Topics Concern   Not on file  Social History Narrative   Lives with [place] daughter, [place] son in Social worker, [place] granddaughter   Social Drivers of Health   Financial Resource Strain: Low Risk  (02/21/2024)   Received from [facility]   Overall Financial Resource Strain (CARDIA)    Difficulty of Paying Living Expenses: Not hard at all  Food Insecurity: No Food Insecurity (02/21/2024)   Received from [facility]   Hunger Vital Sign    Within the past 12 months, you worried that your food would run out before you got the money to buy more.: Never true    Within the past 12 months, the food you bought just didn't last and you didn't have money to get more.: Never true  Transportation Needs: No Transportation Needs (02/21/2024)   Received from [facility]   PRAPARE - Transportation    Lack of Transportation (Medical): No    Lack of Transportation (Non-Medical): No  Physical Activity: Inactive (02/21/2024)   Received from [facility]   Exercise Vital Sign    On average, how many days per week do you engage in moderate to strenuous exercise (like a brisk walk)?: 0 days    On average, how many minutes do you engage in exercise at this level?: 0 min  Stress: No Stress Concern Present (02/21/2024)   Received from [facility] of Occupational Health - Occupational Stress Questionnaire    Feeling of Stress : Not at all   Social Connections: Socially Isolated (02/21/2024)   Received from [facility]   Social Connection and Isolation Panel    In a typical week, how many times do you talk on the phone with family, friends, or neighbors?: Once a week    How often do you get together with friends or relatives?: Once a week    How often do you attend church or religious services?: Never    Do you belong to any clubs or organizations such as church groups, unions, fraternal or athletic groups, or school groups?: No    How often do you attend meetings of the clubs or organizations you belong to?: Never    Are you married, widowed, divorced, separated, never married, or living with  a partner?: Widowed  Intimate Partner Violence: Not At Risk (02/21/2024)   Received from [facility]   Humiliation, Afraid, Rape, and Kick questionnaire    Within the last year, have you been afraid of your partner or ex-partner?: No    Within the last year, have you been humiliated or emotionally abused in other ways by your partner or ex-partner?: No    Within the last year, have you been kicked, hit, slapped, or otherwise physically hurt by your partner or ex-partner?: No    Within the last year, have you been raped or forced to have any kind of sexual activity by your partner or ex-partner?: No    ROS      Objective    BP 94/73   Pulse 94   Ht 5' 4 (1.626 m)   Wt 293 lb 9.6 oz (133.2 kg)   SpO2 97%   BMI 50.40 kg/m   General: well appearing, NAD, in wheelchair Cardiovascular: RRR, no m/r/g Respiratory: normal work of breathing on RA, CTAB MSK: large circumferential mass to left upper arm, appears consistent with lipoma  Extremities: Venous stasis changes BLE, L>R.      Assessment & Plan:   Problem List Items Addressed This Visit       Endocrine   Insulin  dependent type 2 diabetes mellitus (HCC)   A1c 7.6 today. Tolerating ozempic well, will increase to 1mg  weekly. F/u 1 month. Refilled libre sensor.  Referred to podiatry and ophthalmology for diabetic eye exam.       Relevant Medications   Semaglutide, 1 MG/DOSE, 4 MG/3ML SOPN   Insulin  Glargine (BASAGLAR  KWIKPEN) 100 UNIT/ML     Other   Vitamin D deficiency   Taking unusual vit D regimen, will check level today and reassess. Has previously reported very low vit D level.       Relevant Orders   Vitamin D, 25-hydroxy   Vitamin B12 deficiency   Will recheck level today      Other Visit Diagnoses       Encounter to establish care    -  Primary     Type 2 diabetes mellitus with hyperglycemia, unspecified whether long term insulin  use (HCC)       Relevant Medications   Semaglutide, 1 MG/DOSE, 4 MG/3ML SOPN   Insulin  Glargine (BASAGLAR  KWIKPEN) 100 UNIT/ML   Other Relevant Orders   POCT A1C (Completed)   Basic Metabolic Panel   Ambulatory referral to Podiatry   Ambulatory referral to Ophthalmology     B12 deficiency       Relevant Orders   Vitamin B12      F/u 1 month  Kallin Henk, DO

## 2024-08-01 NOTE — Assessment & Plan Note (Signed)
 Taking unusual vit D regimen, will check level today and reassess. Has previously reported very low vit D level.

## 2024-08-01 NOTE — Assessment & Plan Note (Addendum)
 A1c 7.6 today. Tolerating ozempic well, will increase to 1mg  weekly. F/u 1 month. Refilled libre sensor. Referred to podiatry and ophthalmology for diabetic eye exam.

## 2024-08-01 NOTE — Patient Instructions (Signed)
 Good to see you today - Thank you for coming in  Things we discussed today:  Nice to meet you today.  I am checking some lab work, we will let you know if the results are abnormal. I increased your Ozempic today.  I would like to see you in 1 month.

## 2024-08-02 LAB — BASIC METABOLIC PANEL WITH GFR
BUN/Creatinine Ratio: 19 (ref 12–28)
BUN: 19 mg/dL (ref 8–27)
CO2: 20 mmol/L (ref 20–29)
Calcium: 9.2 mg/dL (ref 8.7–10.3)
Chloride: 100 mmol/L (ref 96–106)
Creatinine, Ser: 1.02 mg/dL — ABNORMAL HIGH (ref 0.57–1.00)
Glucose: 149 mg/dL — ABNORMAL HIGH (ref 70–99)
Potassium: 4.5 mmol/L (ref 3.5–5.2)
Sodium: 137 mmol/L (ref 134–144)
eGFR: 57 mL/min/1.73 — ABNORMAL LOW (ref >59)

## 2024-08-02 LAB — VITAMIN B12: Vitamin B-12: 1142 pg/mL (ref 232–1245)

## 2024-08-02 LAB — VITAMIN D 25 HYDROXY (VIT D DEFICIENCY, FRACTURES): Vit D, 25-Hydroxy: 36.1 ng/mL (ref 30.0–100.0)

## 2024-08-03 ENCOUNTER — Ambulatory Visit: Payer: Self-pay | Admitting: Family Medicine

## 2024-08-29 ENCOUNTER — Ambulatory Visit: Admitting: Podiatry

## 2024-08-29 ENCOUNTER — Encounter: Payer: Self-pay | Admitting: Family Medicine

## 2024-08-29 ENCOUNTER — Encounter: Payer: Self-pay | Admitting: Podiatry

## 2024-08-29 ENCOUNTER — Ambulatory Visit: Admitting: Family Medicine

## 2024-08-29 VITALS — BP 132/80 | HR 91 | Ht 64.0 in | Wt 295.2 lb

## 2024-08-29 DIAGNOSIS — M79675 Pain in left toe(s): Secondary | ICD-10-CM

## 2024-08-29 DIAGNOSIS — M79674 Pain in right toe(s): Secondary | ICD-10-CM

## 2024-08-29 DIAGNOSIS — Z0000 Encounter for general adult medical examination without abnormal findings: Secondary | ICD-10-CM | POA: Diagnosis not present

## 2024-08-29 DIAGNOSIS — B351 Tinea unguium: Secondary | ICD-10-CM | POA: Diagnosis not present

## 2024-08-29 DIAGNOSIS — B353 Tinea pedis: Secondary | ICD-10-CM

## 2024-08-29 DIAGNOSIS — R42 Dizziness and giddiness: Secondary | ICD-10-CM | POA: Diagnosis not present

## 2024-08-29 DIAGNOSIS — E1142 Type 2 diabetes mellitus with diabetic polyneuropathy: Secondary | ICD-10-CM | POA: Diagnosis not present

## 2024-08-29 DIAGNOSIS — G63 Polyneuropathy in diseases classified elsewhere: Secondary | ICD-10-CM | POA: Diagnosis not present

## 2024-08-29 DIAGNOSIS — Z1211 Encounter for screening for malignant neoplasm of colon: Secondary | ICD-10-CM | POA: Diagnosis not present

## 2024-08-29 DIAGNOSIS — D1722 Benign lipomatous neoplasm of skin and subcutaneous tissue of left arm: Secondary | ICD-10-CM

## 2024-08-29 DIAGNOSIS — E119 Type 2 diabetes mellitus without complications: Secondary | ICD-10-CM

## 2024-08-29 DIAGNOSIS — Z794 Long term (current) use of insulin: Secondary | ICD-10-CM | POA: Diagnosis not present

## 2024-08-29 MED ORDER — OZEMPIC (2 MG/DOSE) 8 MG/3ML ~~LOC~~ SOPN: 2.0000 mg | PEN_INJECTOR | SUBCUTANEOUS | 1 refills | Status: DC

## 2024-08-29 MED ORDER — KETOCONAZOLE 2 % EX CREA: 1.0000 | TOPICAL_CREAM | Freq: Every day | CUTANEOUS | 5 refills | Status: DC

## 2024-08-29 MED ORDER — GABAPENTIN 400 MG PO CAPS: 400.0000 mg | ORAL_CAPSULE | Freq: Three times a day (TID) | ORAL | 2 refills | Status: AC

## 2024-08-29 MED ORDER — FREESTYLE LIBRE 3 PLUS SENSOR MISC: 3 refills | Status: AC

## 2024-08-29 NOTE — Patient Instructions (Addendum)
 Good to see you today - Thank you for coming in  Things we discussed today:  I have increased your Ozempic to 2 mg I have also increased your gabapentin  to 3 times daily Be on the look out for the Cologuard to arrive to your house Make sure you are staying well-hydrated and standing up slowly If you decide to get your lipoma removed, let me know  Please follow-up with me in January to recheck your A1c  I recommend the pneumonia shot and the shingles shot.  You are also due for a DEXA scan

## 2024-08-29 NOTE — Assessment & Plan Note (Addendum)
 Average glucose has been 140 on her libre since starting Ozempic.  Tolerating it well.  She has not required her insulin  since starting Ozempic.  Will increase to 2mg  weekly today Return precautions discussed F/u 10/2024 for repeat A1c, get urine at next visit

## 2024-08-29 NOTE — Assessment & Plan Note (Signed)
 Neuropathy pain worsening, will increase gabapentin  to 3 times daily and follow-up on this at next visit.  Daughter helps patient with medications, she states that she will just go back to twice daily patient denies any negative side effects from increasing the frequency.

## 2024-08-29 NOTE — Progress Notes (Signed)
    SUBJECTIVE:   CHIEF COMPLAINT / HPI:   Here to follow-up on recent Ozempic 1mg  weekly 08/01/24, A1C was 7.6 at that time Scheduled for Eye doctor in February  Needs prescription for libre plus  Would like to increase gabapentin  to 3 times daily, she has been taking it for years, but feels like her neuropathy pain in her feet is worsening.  She was seen by podiatry today.  Reports lightheadedness with standing going on for months Daughter has taken her vitals when she has gotten dizzy and her heart rate has gone to 120s with standing  PERTINENT  PMH / PSH: SVT, CAD, HTN, COPD, T2DM, CKD2  OBJECTIVE:   BP 132/80   Pulse 91   Ht 5' 4 (1.626 m)   Wt 295 lb 3.2 oz (133.9 kg)   SpO2 94%   BMI 50.67 kg/m   General: well appearing, NAD Cardiovascular: RRR, no m/r/g Respiratory: normal work of breathing on RA, CTAB Extremities: No swelling BLE. In wheelchair Skin: large well circumscribed non-fluctuant mass to LUE unchanged from exam 1 month ago (see media tab). No overlying erythema or warmth.  POCUS performed with Dr. Madelon consistent with lipoma  ASSESSMENT/PLAN:   Assessment & Plan Insulin  dependent type 2 diabetes mellitus (HCC) Average glucose has been 140 on her libre since starting Ozempic.  Tolerating it well.  She has not required her insulin  since starting Ozempic.  Will increase to 2mg  weekly today Return precautions discussed F/u 10/2024 for repeat A1c, get urine at next visit Light headedness I suspect this is related to orthostasis.  Her blood pressure was on the lower end at her last visit.  I encouraged hydration and being careful when she stands up.  She does have limited mobility so deconditioning may be contributing as well. Polyneuropathy associated with underlying disease Neuropathy pain worsening, will increase gabapentin  to 3 times daily and follow-up on this at next visit.  Daughter helps patient with medications, she states that she will just go  back to twice daily patient denies any negative side effects from increasing the frequency. Lipoma of left upper extremity Beside US  performed consistent with lipoma.  Given that it is not bothersome to patient we can leave it alone or offered her surgery referral for removal given its large size.  She would like to continue watching it for now and will let us  know if she wants it removed. Screening for colon cancer Patient opted for Cologuard today.  No family history of colon cancer and no changes in stool, Cologuard is appropriate.  Order placed. Healthcare maintenance Offered DEXA, patient concerned she will not tolerate laying down for it Patient declined vaccinations today She will be getting diabetic eye exam in February Consider hepatitis C screening with next blood draw     Elyce Prescott, DO [facility]

## 2024-08-29 NOTE — Progress Notes (Unsigned)
 Chief Complaint  Patient presents with   Diabetes    DFC NIDDM A1C 7.0. Toenail trim. Dry scaly skin. Using Nystatin 100000 powder.    HPI: 73 y.o. female presents today with concern of thick, discolored, long toenails that hurt in shoe gear.  She did recently lose the 1st and 2nd toenails from contusions.  Not having any issues with these areas at this time.  She notes that she has issues with swelling in her legs but she does not wear compression stockings nor does she want to.  Some dryness and discoloration to the skin on the feet and extending slightly up the right leg is noted.  She does have itching involved.  She has tried some over-the-counter creams with no improvement.  She has tried nystatin powder as well with no improvement.  She takes gabapentin  400 mg twice daily for peripheral neuropathy.  She does not feel that it is as effective as it used to be.  She did arrived by wheelchair today.  She lives with her daughter.  Past Medical History:  Diagnosis Date   Acid reflux    Arthritis    CAD S/P percutaneous coronary angioplasty 11/13/2014   a. 11/2014 NSTEMI/PCI: LM nl, LAD 80p (3.5x18 Resolute DES), LCX 40p, 35m (3.5x15 Resolute DES), RCA 72m, EF 50%.   Cholecystitis, acute with cholelithiasis 09/10/2013   COPD (chronic obstructive pulmonary disease) (HCC)    Essential hypertension    Hyperlipidemia    Non-ST elevation (NSTEMI) myocardial infarction (HCC) 11/13/2014   NSTEMI (non-ST elevated myocardial infarction) (HCC) 11/30/2014   NSTEMI, initial episode of care (HCC) 11/30/2014   PONV (postoperative nausea and vomiting)    Sleep apnea    Tobacco abuse    Type 2 diabetes mellitus (HCC)    Past Surgical History:  Procedure Laterality Date   ABDOMINAL HYSTERECTOMY     CHOLECYSTECTOMY N/A 09/12/2013   Procedure: LAPAROSCOPIC CHOLECYSTECTOMY;  Surgeon: Oneil DELENA Budge, MD;  Location: [facility];  Service: General;  Laterality: N/A;   EYE SURGERY     LEFT HEART  CATHETERIZATION WITH CORONARY ANGIOGRAM N/A 11/30/2014   Procedure: LEFT HEART CATHETERIZATION WITH CORONARY ANGIOGRAM;  Surgeon: Debby DELENA Sor, MD;  Location[facility] C[facility] LAB;  Service: Cardiovascular;  Laterality: N/A;   PERCUTANEOUS CORONARY STENT INTERVENTION (PCI-S) N/A 12/04/2014   Procedure: PERCUTANEOUS CORONARY STENT INTERVENTION (PCI-S);  Surgeon: Debby DELENA Sor, MD;  Location: [facility] CATH LAB;  Service: Cardiovascular;  Laterality: N/A;   TONSILLECTOMY     Allergies  Allergen Reactions   Metformin  And Related Diarrhea and Nausea Only     Physical Exam: General: The patient is alert and oriented x3 in no acute distress.  Dermatology: Skin is warm, dry and supple bilateral lower extremities. Interspaces are clear of maceration and debris.  Toenails x 8 are 3 mm thick with yellow discoloration, subungual bree, distal onycholysis and pain with compression.  They are all elongated.  There is absence of the 1st and 2nd toenails due to recent contusions but the areas are dry and stable with no signs of infection.  Skin is dry and flaky to the plantar aspect and extending up to the dorsal foot and lower one third of the right leg.  There is diffuse erythema noted.  This could be consistent with tinea pedis  Vascular: Palpable DP pedal pulses bilaterally.  Nonpalpable PT pulses bilaterally.  Capillary refill within normal limits.  +2 pitting edema bilateral lower legs and ankles.  No significant edema  extending into the feet.  Telangiectasias noted bilateral.  Neurological: Vibratory sensation absent to the forefoot bilateral.  Decreased protective sensation with a Semmes Weinstein monofilament bilateral forefoot.  Temperature sensation slightly altered.  Assessment/Plan of Care: 1. Pain due to onychomycosis of toenails of both feet   2. Tinea pedis of both feet   3. Type 2 diabetes mellitus with peripheral neuropathy (HCC)      Meds ordered this encounter  Medications   DISCONTD: ketoconazole  (NIZORAL) 2 % cream    Sig: Apply 1 Application topically daily. Apply 1gm to affected feet and legs once daily until resolved.    Dispense:  60 g    Refill:  5   Discussed clinical findings with patient today.  Toenails x 8 were debrided with sterile nail nippers and a power debriding burr to decrease bulk and length.  Sent in prescription for ketoconazole 2% cream to apply once daily to the feet and lower legs.  Discussed having the patient speak to her primary care physician regarding the gabapentin  dosage.  She may need to increase the frequency from twice daily to 3 times daily and see how she responds.  Follow-up 3 months for diabetic footcare  Darren Nodal D. Yochanan Eddleman, DPM, FACFAS [facility]     [address] [place], [place] [address]                Office [phone]  Fax [phone]

## 2024-10-26 ENCOUNTER — Other Ambulatory Visit: Payer: Self-pay | Admitting: Family Medicine

## 2024-11-18 ENCOUNTER — Other Ambulatory Visit: Payer: Self-pay | Admitting: Family Medicine

## 2024-11-28 ENCOUNTER — Ambulatory Visit: Admitting: Podiatry

## 2024-12-02 ENCOUNTER — Ambulatory Visit: Admitting: Family Medicine
